# Patient Record
Sex: Female | Born: 1955 | Race: White | Hispanic: No | Marital: Married | Smoking: Never smoker
Health system: Southern US, Community
[De-identification: ages and names within clinical notes are randomized; demographics above are authoritative.]

## PROBLEM LIST (undated history)

## (undated) DIAGNOSIS — I341 Nonrheumatic mitral (valve) prolapse: Secondary | ICD-10-CM

## (undated) DIAGNOSIS — I82409 Acute embolism and thrombosis of unspecified deep veins of unspecified lower extremity: Secondary | ICD-10-CM

## (undated) DIAGNOSIS — E049 Nontoxic goiter, unspecified: Secondary | ICD-10-CM

## (undated) DIAGNOSIS — I10 Essential (primary) hypertension: Secondary | ICD-10-CM

## (undated) DIAGNOSIS — D693 Immune thrombocytopenic purpura: Secondary | ICD-10-CM

## (undated) DIAGNOSIS — R011 Cardiac murmur, unspecified: Secondary | ICD-10-CM

## (undated) DIAGNOSIS — C44722 Squamous cell carcinoma of skin of right lower limb, including hip: Secondary | ICD-10-CM

## (undated) DIAGNOSIS — J42 Unspecified chronic bronchitis: Secondary | ICD-10-CM

## (undated) DIAGNOSIS — I480 Paroxysmal atrial fibrillation: Secondary | ICD-10-CM

## (undated) DIAGNOSIS — M341 CR(E)ST syndrome: Secondary | ICD-10-CM

## (undated) DIAGNOSIS — D6861 Antiphospholipid syndrome: Secondary | ICD-10-CM

## (undated) DIAGNOSIS — M1990 Unspecified osteoarthritis, unspecified site: Secondary | ICD-10-CM

## (undated) DIAGNOSIS — I7300 Raynaud's syndrome without gangrene: Secondary | ICD-10-CM

## (undated) DIAGNOSIS — I312 Hemopericardium, not elsewhere classified: Secondary | ICD-10-CM

## (undated) DIAGNOSIS — K219 Gastro-esophageal reflux disease without esophagitis: Secondary | ICD-10-CM

## (undated) DIAGNOSIS — J189 Pneumonia, unspecified organism: Secondary | ICD-10-CM

## (undated) HISTORY — DX: Immune thrombocytopenic purpura: D69.3

## (undated) HISTORY — DX: Nonrheumatic mitral (valve) prolapse: I34.1

## (undated) HISTORY — DX: Antiphospholipid syndrome: D68.61

## (undated) HISTORY — PX: TUBAL LIGATION: SHX77

## (undated) HISTORY — DX: Cr(e)st syndrome: M34.1

## (undated) HISTORY — PX: ENDOMETRIAL ABLATION: SHX621

## (undated) HISTORY — DX: Acute embolism and thrombosis of unspecified deep veins of unspecified lower extremity: I82.409

---

## 1993-08-07 HISTORY — PX: SPLENECTOMY: SUR1306

## 2005-11-06 ENCOUNTER — Encounter: Admission: RE | Admit: 2005-11-06 | Discharge: 2005-11-06 | Payer: Self-pay | Admitting: *Deleted

## 2006-09-25 ENCOUNTER — Ambulatory Visit: Payer: Self-pay | Admitting: Vascular Surgery

## 2006-09-25 ENCOUNTER — Emergency Department (HOSPITAL_COMMUNITY): Admission: EM | Admit: 2006-09-25 | Discharge: 2006-09-25 | Payer: Self-pay | Admitting: Emergency Medicine

## 2007-09-19 ENCOUNTER — Ambulatory Visit: Admission: RE | Admit: 2007-09-19 | Discharge: 2007-09-19 | Payer: Self-pay | Admitting: Internal Medicine

## 2007-09-19 LAB — PULMONARY FUNCTION TEST

## 2008-11-19 ENCOUNTER — Observation Stay (HOSPITAL_COMMUNITY): Admission: EM | Admit: 2008-11-19 | Discharge: 2008-11-20 | Payer: Self-pay | Admitting: Emergency Medicine

## 2008-11-19 ENCOUNTER — Encounter (INDEPENDENT_AMBULATORY_CARE_PROVIDER_SITE_OTHER): Payer: Self-pay | Admitting: Internal Medicine

## 2008-11-24 ENCOUNTER — Encounter: Admission: RE | Admit: 2008-11-24 | Discharge: 2008-11-24 | Payer: Self-pay | Admitting: Gastroenterology

## 2008-12-25 ENCOUNTER — Encounter: Payer: Self-pay | Admitting: Cardiology

## 2009-04-09 ENCOUNTER — Encounter: Admission: RE | Admit: 2009-04-09 | Discharge: 2009-04-09 | Payer: Self-pay | Admitting: Otolaryngology

## 2009-11-29 ENCOUNTER — Ambulatory Visit (HOSPITAL_COMMUNITY): Admission: RE | Admit: 2009-11-29 | Discharge: 2009-11-29 | Payer: Self-pay | Admitting: Internal Medicine

## 2010-02-22 ENCOUNTER — Emergency Department (HOSPITAL_COMMUNITY): Admission: EM | Admit: 2010-02-22 | Discharge: 2010-02-22 | Payer: Self-pay | Admitting: Family Medicine

## 2010-03-01 ENCOUNTER — Ambulatory Visit (HOSPITAL_COMMUNITY): Admission: RE | Admit: 2010-03-01 | Discharge: 2010-03-01 | Payer: Self-pay | Admitting: Internal Medicine

## 2010-08-28 ENCOUNTER — Encounter: Payer: Self-pay | Admitting: Obstetrics and Gynecology

## 2010-08-28 ENCOUNTER — Encounter: Payer: Self-pay | Admitting: Internal Medicine

## 2010-08-30 ENCOUNTER — Ambulatory Visit (HOSPITAL_COMMUNITY)
Admission: RE | Admit: 2010-08-30 | Discharge: 2010-08-30 | Payer: Self-pay | Source: Home / Self Care | Attending: Gastroenterology | Admitting: Gastroenterology

## 2010-08-30 ENCOUNTER — Observation Stay (HOSPITAL_COMMUNITY)
Admission: EM | Admit: 2010-08-30 | Discharge: 2010-08-31 | Payer: Self-pay | Source: Home / Self Care | Attending: Cardiovascular Disease | Admitting: Cardiovascular Disease

## 2010-08-31 LAB — DIFFERENTIAL
Basophils Absolute: 0 10*3/uL (ref 0.0–0.1)
Basophils Relative: 0 % (ref 0–1)
Eosinophils Absolute: 0.1 10*3/uL (ref 0.0–0.7)
Eosinophils Relative: 1 % (ref 0–5)
Lymphocytes Relative: 13 % (ref 12–46)
Monocytes Absolute: 2 10*3/uL — ABNORMAL HIGH (ref 0.1–1.0)
Monocytes Relative: 14 % — ABNORMAL HIGH (ref 3–12)
Neutro Abs: 9.7 10*3/uL — ABNORMAL HIGH (ref 1.7–7.7)
Neutrophils Relative %: 71 % (ref 43–77)

## 2010-08-31 LAB — CARDIAC PANEL(CRET KIN+CKTOT+MB+TROPI)
CK, MB: 0.6 ng/mL (ref 0.3–4.0)
CK, MB: 0.7 ng/mL (ref 0.3–4.0)
Total CK: 49 U/L (ref 7–177)
Troponin I: 0.05 ng/mL (ref 0.00–0.06)

## 2010-08-31 LAB — CBC
HCT: 36.8 % (ref 36.0–46.0)
Hemoglobin: 12.4 g/dL (ref 12.0–15.0)
MCHC: 33.7 g/dL (ref 30.0–36.0)
RBC: 3.95 MIL/uL (ref 3.87–5.11)

## 2010-08-31 LAB — PROTIME-INR
INR: 1.54 — ABNORMAL HIGH (ref 0.00–1.49)
Prothrombin Time: 19.9 seconds — ABNORMAL HIGH (ref 11.6–15.2)

## 2010-08-31 LAB — COMPREHENSIVE METABOLIC PANEL
Albumin: 3.1 g/dL — ABNORMAL LOW (ref 3.5–5.2)
BUN: 10 mg/dL (ref 6–23)
Calcium: 9.3 mg/dL (ref 8.4–10.5)
Chloride: 103 mEq/L (ref 96–112)
Glucose, Bld: 86 mg/dL (ref 70–99)
Total Protein: 6.4 g/dL (ref 6.0–8.3)

## 2010-08-31 LAB — LIPASE, BLOOD: Lipase: 21 U/L (ref 11–59)

## 2010-08-31 LAB — POCT CARDIAC MARKERS: CKMB, poc: <1 ng/mL — ABNORMAL LOW (ref 1.0–8.0)

## 2010-09-07 NOTE — Discharge Summary (Signed)
  [name], [name]               ACCOUNT NO.:  [id number]  MEDICAL RECORD NO.:  [id number]          PATIENT TYPE:  OBV  LOCATION:  1410                         FACILITY:  [facility]  PHYSICIAN:  Nicki Guadalajara, M.D.     DATE OF BIRTH:  12/26/1955  DATE OF ADMISSION:  08/30/2010 DATE OF DISCHARGE:  08/31/2010                              DISCHARGE SUMMARY   ADDENDUM:  DISCHARGE MEDICATIONS:  Please see medical reconciliation.  DISPOSITION:  [name] will follow up with Dr. Lynnea Ferrier on February 1st at 10 a.m.  She has had a recent nuclear stress test and echocardiogram.  Dr. Lynnea Ferrier will review the echocardiogram in the office during her visit as well as her nuclear stress test and then plan accordingly her treatment.  We will also discontinue her Procardia due to the fact that we were starting her on Cardizem 180 mg p.o. daily.    ______________________________ Wilburt Finlay, PA   ______________________________ Nicki Guadalajara, M.D.    BH/MEDQ  D:  08/31/2010  T:  09/01/2010  Job:  [id number]  cc:   Ritta Slot, MD Fax: [phone]  Electronically Signed by Wilburt Finlay PA on 09/05/2010 02:45:37 PM Electronically Signed by Nicki Guadalajara M.D. on 09/07/2010 02:39:14 PM

## 2010-09-07 NOTE — H&P (Signed)
NAMELONISHA, BOBBY               ACCOUNT NO.:  [id number]  MEDICAL RECORD NO.:  [id number]           PATIENT TYPE:  LOCATION:                                 FACILITY:  PHYSICIAN:  Nicki Guadalajara, M.D.     DATE OF BIRTH:  11-19-1955  DATE OF ADMISSION: DATE OF DISCHARGE:                             HISTORY & PHYSICAL   CHIEF COMPLAINT:  Tachycardia.  HISTORY OF PRESENT ILLNESS:  [name] is a pleasant 55 year old female who has been seen by Dr. Lynnea Ferrier in the past.  She has multiple medical problems.  We saw her in April 2010 for some chest pain, which we felt was noncardiac.  She has had PACs and PVCs in the past, but no arrhythmias.  She does have a history of CREST and Raynaud and scleroderma.  She has had chronic lower extremity DVTs and is on chronic Coumadin.  She was admitted to [facility] for an endoscopy to rule out food impaction or esophageal spasm.  She had been admitted electively and had held her Coumadin on the day of admission.  She had her procedure done and then had some SVT and then went into atrial fibrillation.  This was about 3 p.m. and lasted until about 6 p.m.  She finally converted on IV diltiazem.  She did have some chest discomfort. Dr. Tresa Endo feels it is safer to admit her overnight for 24-hour observation and continue to cycle her enzymes.  She has also had a CT scan of her chest to rule out pulmonary embolism as her INR was subtherapeutic at 1.54.  This is pending.  PAST MEDICAL HISTORY:  Remarkable for, 1. Scleroderma. 2. Raynaud's. 3. Lupus. 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 6. Trigeminal neuralgia. 7. Chronic interstitial lung disease by prior x-rays. 8. Goiter with normal thyroid studies. 9. 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  PREVIOUS SURGERIES:  Splenectomy in 1995, uterine oblation in 2008, tubal ligation in 1985, and previous esophageal  dilatations.  HOME MEDICATIONS: 1. Nexium 40 mg twice a day. 2. Procardia XL 30 mg a day. 3. Singulair 10 mg a day. 4. Prometrium 100 mg a day. 5. Aspirin 325 mg a day. 6. Losartan 100 mg a day. 7. Coumadin 7.5 mg a day or as directed. 8. Neurontin 200 mg t.i.d. 9. Cymbalta 20 mg a day. 10.Actonel once a week. 11.Cipro 500 mg a day. 12.Valtrex 500 mg b.i.d. 13.Nitroglycerin sublingual p.r.n.  ALLERGIES:  She is intolerant to DEMEROL, but has no other drug allergies.  SOCIAL HISTORY:  She is married, she is a nonsmoker.  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  FAMILY HISTORY:  Unremarkable for coronary disease, her father died in his 37s of unknown causes, mother died in her 35s of lung cancer.  REVIEW OF SYSTEMS:  Essentially unremarkable except for noted above, she did say she had some vague chest discomfort last week, which actually precipitated this evaluation.  It is not clearly anginal.  PHYSICAL EXAMINATION:  VITAL SIGNS:  Blood pressure 101/56, heart rate currently  76, O2 saturations 96%. GENERAL:  She is a well-developed and well-nourished female, in no acute distress. HEENT:  Normocephalic.  Extraocular movements are intact.  Sclerae nonicteric. NECK:  Goiter, it is firm and nontender.  No bruit. CHEST:  Clear to auscultation and percussion. CARDIAC:  Regular rate and rhythm with 1/6 systolic murmur at the left sternal border. ABDOMEN:  Nondistended, nontender. EXTREMITIES:  No edema.  Distal pulses are diminished bilaterally. NEUROLOGIC:  Grossly intact.  She is awake, alert, oriented, and cooperative.  Moves all extremities without obvious deficit. SKIN:  Cool and dry.  LABORATORY DATA:  White count 13.6, hemoglobin 12.4, hematocrit 36.8, platelets 286.  INR 1.54.  Sodium 136, potassium 3.5, BUN 10, creatinine 0.71. Troponin is negative x1.  EKG shows sinus rhythm now with nonspecific ST changes.  IMPRESSION: 1. Chest pain, rule out  cardiac. 2. Paroxysmal atrial fibrillation, now in sinus rhythm, on diltiazem. 3. Raynaud, on Procardia at home, which the patient says had helped     her symptoms of Raynaud's. 4. Scleroderma and CREST syndrome. 5. Chronic reflux and achalasia. 6. History of goiter. 7. Chronic interstitial lung disease, by x-ray. 8. History of idiopathic thrombocytopenic purpura with prior     splenectomy. 9. History of trigeminal neuralgia, followed by Dr. Sandria Manly in the past. 10.Chronic lower extremity deep vein thrombosis, on chronic Coumadin     with subtherapeutic INR, held for endoscopy today, chest CT is     pending to rule out pulmonary embolism.  PLAN:  The patient was seen Dr. Tresa Endo and myself.  I will go ahead and discontinue her Procardia and put her on diltiazem p.o.  We will also add low-dose Imdur.  Dr. Tresa Endo would like to admit her for overnight observation and continue cycle her enzymes.  She will need an outpatient Myoview at some point and this can be raised.     Abelino Derrick, P.A.   ______________________________ Nicki Guadalajara, M.D.    Lenard Lance  D:  08/30/2010  T:  08/30/2010  Job:  [id number]  cc:   Ritta Slot, MD Fax: [phone]  Massie Maroon, MD Fax: [phone]  Electronically Signed by Corine Shelter P.A. on 09/06/2010 05:10:24 PM Electronically Signed by Nicki Guadalajara M.D. on 09/07/2010 02:39:18 PM

## 2010-09-07 NOTE — Discharge Summary (Signed)
[name], [name]               ACCOUNT NO.:  [id number]  MEDICAL RECORD NO.:  [id number]          PATIENT TYPE:  OBV  LOCATION:  1410                         FACILITY:  [facility]  PHYSICIAN:  Nicki Guadalajara, M.D.     DATE OF BIRTH:  June 21, 1956  DATE OF ADMISSION:  08/30/2010 DATE OF DISCHARGE:  08/31/2010                              DISCHARGE SUMMARY   DISCHARGE DIAGNOSES: 1. Paroxysmal atrial fibrillation, converted to normal sinus rhythm on     IV diltiazem. 2. Chest pain, cardiac enzymes are negative, likely esophageal spasm. 3. History of Raynaud's. 4. History of scleroderma/CREST syndrome. 5. Chronic reflux and achalasia. 6. Multinodular goiter. 7. Chronic interstitial lung disease. 8. History of idiopathic thrombocytopenic purpura with prior     splenectomy. 9. History of trigeminal neuralgia. 10.Chronic lower extremity deep venous thrombosis, on Coumadin.  This     was held for endoscopy, which was on January 24th, and subsequently     restarted.  CT scan was negative for pulmonary embolism. 11.Small pericardial effusion.  HOSPITAL COURSE:  [name] is a 55 year old Caucasian female who is a patient of Dr. Lynnea Ferrier.  She has a history of scleroderma, Raynaud's, lupus, CREST syndrome, ITP status post splenectomy in 1995, trigeminal neuralgia, chronic interstitial lung disease on prior x-rays, goiter with normal thyroid studies, DVT in lower extremities requiring lifelong Coumadin.  She presented for endoscopy at [facility] after being ruled for food impaction or esophageal spasm.  When the procedure was done, she had some SVT and went into atrial fibrillation at approximately 3 p.m. and it lasted until 6 p.m.  She was given IV diltiazem as well as a dose of 180 mg p.o.  She was admitted for observation, to cycle her cardiac enzymes.  She also had a CT scan to rule out pulmonary embolism due to the fact that her INR was subtherapeutic.  Cardiac enzymes had been  negative.  CT scan was negative for pulmonary embolism, however, was positive for a small pericardial effusion.  The patient is currently in normal sinus rhythm, had been throughout the night.  Rate is 72 beats per minute.  The patient was also started on Imdur 15 mg daily by mouth. Plan is to continue her on that and will continue Cardizem 100 mg p.o. daily.  She has a followup appointment scheduled with Dr. Lynnea Ferrier on February 1st at 10 a.m. and we will schedule a Persantine Myoview stress test prior to that appointment and also schedule for possible 2-D echocardiogram to follow up on the pericardial effusion.  DISCHARGE LABORATORY DATA:  INR 1.67.  CK-MB 0.6.  Troponin 0.05.  WBCs 13.6, hemoglobin 12.4, hematocrit 36.8, platelets 286.  Sodium 136, potassium 3.5, chloride 103, carbon dioxide 25, glucose 86, BUN 10, creatinine 0.71.  Total bilirubin 1.1, alk phos 73, AST 16, ALT 20, total protein 6.4, albumin 3.1, calcium 9.3.  Lipase is 21.  Free T4 is 0.81 and thyroid-stimulating hormone is 0.674.  STUDIES/PROCEDURES: 1. Chest x-ray:  Impression:  Probable mild interstitial change.  No     definite active process.  Question enlargement of thyroid gland. 2. CT angiogram  of the chest, August 30, 2010:  Impression:  No     pulmonary embolism.  Increased bilateral lower lobe linear     atelectasis or scarring.  Interval small pericardial effusion.     Stable multinodular goiter with large substernal extension on the     left.  Mild dilation of the esophagus with mild progression and     continuing to contain an air-fluid level.  This could be due to     gastroesophageal reflux disease or esophageal dysmotility.  DISPOSITION:  [name] was discharged home in stable condition.  DIET:  She is to eat a heart-healthy diet.  FOLLOWUP:  She will follow up with [facility] for a Persantine Myoview stress test and then see Dr. Lynnea Ferrier at 10 o'clock on February 1st for  a followup appointment.    ______________________________ Wilburt Finlay, PA   ______________________________ Nicki Guadalajara, M.D.    BH/MEDQ  D:  08/31/2010  T:  09/01/2010  Job:  [id number]  cc:   Ritta Slot, MD Fax: [phone]  Electronically Signed by Wilburt Finlay PA on 09/05/2010 02:45:11 PM Electronically Signed by Nicki Guadalajara M.D. on 09/07/2010 02:39:08 PM

## 2010-11-16 LAB — BASIC METABOLIC PANEL
BUN: 14 mg/dL (ref 6–23)
Creatinine, Ser: 0.81 mg/dL (ref 0.4–1.2)
GFR calc Af Amer: >60 mL/min (ref >60)
GFR calc non Af Amer: >60 mL/min (ref >60)
Potassium: 3.9 mEq/L (ref 3.5–5.1)

## 2010-11-16 LAB — DIFFERENTIAL
Basophils Absolute: 0.1 10*3/uL (ref 0.0–0.1)
Basophils Relative: 0 % (ref 0–1)
Eosinophils Absolute: 0 10*3/uL (ref 0.0–0.7)
Lymphocytes Relative: 8 % — ABNORMAL LOW (ref 12–46)
Lymphs Abs: 1.4 10*3/uL (ref 0.7–4.0)
Monocytes Relative: 11 % (ref 3–12)
Monocytes Relative: 8 % (ref 3–12)
Neutro Abs: 11.2 10*3/uL — ABNORMAL HIGH (ref 1.7–7.7)
Neutrophils Relative %: 76 % (ref 43–77)
Neutrophils Relative %: 83 % — ABNORMAL HIGH (ref 43–77)

## 2010-11-16 LAB — POCT I-STAT, CHEM 8
Calcium, Ion: 1.25 mmol/L (ref 1.12–1.32)
Creatinine, Ser: 0.7 mg/dL (ref 0.4–1.2)
Glucose, Bld: 104 mg/dL — ABNORMAL HIGH (ref 70–99)
Hemoglobin: 15.6 g/dL — ABNORMAL HIGH (ref 12.0–15.0)
TCO2: 25 mmol/L (ref 0–100)

## 2010-11-16 LAB — POCT CARDIAC MARKERS
CKMB, poc: <1 ng/mL — ABNORMAL LOW (ref 1.0–8.0)
CKMB, poc: <1 ng/mL — ABNORMAL LOW (ref 1.0–8.0)
Myoglobin, poc: 35.3 ng/mL (ref 12–200)

## 2010-11-16 LAB — COMPREHENSIVE METABOLIC PANEL
ALT: 22 U/L (ref 0–35)
AST: 24 U/L (ref 0–37)
Albumin: 3.9 g/dL (ref 3.5–5.2)
Alkaline Phosphatase: 87 U/L (ref 39–117)
Potassium: 3.9 mEq/L (ref 3.5–5.1)
Sodium: 140 mEq/L (ref 135–145)
Total Protein: 7.2 g/dL (ref 6.0–8.3)

## 2010-11-16 LAB — CBC
HCT: 42.3 % (ref 36.0–46.0)
MCHC: 34 g/dL (ref 30.0–36.0)
MCV: 93.4 fL (ref 78.0–100.0)
Platelets: 216 10*3/uL (ref 150–400)
Platelets: 252 10*3/uL (ref 150–400)
RBC: 4.52 MIL/uL (ref 3.87–5.11)
RDW: 13.5 % (ref 11.5–15.5)
WBC: 16.7 10*3/uL — ABNORMAL HIGH (ref 4.0–10.5)

## 2010-11-16 LAB — PROTIME-INR
INR: 1.8 — ABNORMAL HIGH (ref 0.00–1.49)
INR: 2.1 — ABNORMAL HIGH (ref 0.00–1.49)
Prothrombin Time: 25.2 seconds — ABNORMAL HIGH (ref 11.6–15.2)

## 2010-11-16 LAB — LIPID PANEL
LDL Cholesterol: 75 mg/dL (ref 0–99)
Total CHOL/HDL Ratio: 2.7 RATIO
VLDL: 7 mg/dL (ref 0–40)

## 2010-11-16 LAB — CARDIAC PANEL(CRET KIN+CKTOT+MB+TROPI)
CK, MB: 0.5 ng/mL (ref 0.3–4.0)
Relative Index: INVALID (ref 0.0–2.5)
Relative Index: INVALID (ref 0.0–2.5)
Total CK: 39 U/L (ref 7–177)
Troponin I: <0.01 ng/mL (ref 0.00–0.06)
Troponin I: <0.01 ng/mL (ref 0.00–0.06)
Troponin I: <0.01 ng/mL (ref 0.00–0.06)

## 2010-11-16 LAB — APTT: aPTT: 50 seconds — ABNORMAL HIGH (ref 24–37)

## 2010-11-29 ENCOUNTER — Other Ambulatory Visit (HOSPITAL_COMMUNITY): Payer: Self-pay | Admitting: Internal Medicine

## 2010-11-29 DIAGNOSIS — R911 Solitary pulmonary nodule: Secondary | ICD-10-CM

## 2010-12-03 ENCOUNTER — Inpatient Hospital Stay (INDEPENDENT_AMBULATORY_CARE_PROVIDER_SITE_OTHER)
Admission: RE | Admit: 2010-12-03 | Discharge: 2010-12-03 | Disposition: A | Discharge status: Home / Self Care | Payer: 59 | Source: Ambulatory Visit | Attending: Emergency Medicine | Admitting: Emergency Medicine

## 2010-12-03 DIAGNOSIS — J069 Acute upper respiratory infection, unspecified: Secondary | ICD-10-CM

## 2010-12-03 LAB — POCT RAPID STREP A (OFFICE): Streptococcus, Group A Screen (Direct): NEGATIVE

## 2010-12-19 ENCOUNTER — Other Ambulatory Visit (HOSPITAL_COMMUNITY): Payer: Self-pay

## 2010-12-20 NOTE — Discharge Summary (Signed)
NAMESTELLAH, DONOVAN               ACCOUNT NO.:  [id number]   MEDICAL RECORD NO.:  [id number]          PATIENT TYPE:  INP   LOCATION:  2915                         FACILITY:  [facility]   PHYSICIAN:  Monte Fantasia, MD  DATE OF BIRTH:  July 20, 1956   DATE OF ADMISSION:  11/19/2008  DATE OF DISCHARGE:  11/20/2008                               DISCHARGE SUMMARY   PRIMARY CARE PHYSICIAN:  Massie Maroon, MD, [facility].   PRIMARY CARDIOLOGIST:  Dr.  Ladona Ridgel in [place].   GASTROENTEROLOGIST:  Jordan Hawks. Elnoria Howard, MD   RHEUMATOLOGIST:  Lemmie Evens, MD   NEUROLOGIST:  Genene Churn. Love, MD   DISCHARGE DIAGNOSES:  1. Chest pain more likely to be noncardiac.  2. Scleroderma.  3. History of Raynaud disease.  4. History of venous thromboembolism.  5. Goiter.   MEDICATIONS UPON DISCHARGE:  1. Progesterone cream 40 mg/mL 1-2 times to be applied topically every      day to the inner thighs.  2. Nexium 40 mg p.o. b.i.d.  3. Sucralfate 1 g p.o. q.i.d. for 4 weeks.  4. Gabapentin 100 mg p.o. t.i.d.  5. Coumadin 7.5 mg p.o. daily except on Sundays when she takes 5 mg      daily.  6. Procardia XL 60 mg half a tablet nightly.  7. Vitamin D 1000 units p.o. b.i.d.  8. Calcium 1000 mg plus vitamin D 1 tablet p.o. b.i.d.  9. Aspirin 325 mg p.o. daily.  10.Ramipril 5 mg p.o. daily.   RADIOLOGICAL INVESTIGATIONS DONE DURING THE STAY IN THE HOSPITAL:  1. Chest x-ray done on November 19, 2008.  Impression, peribronchial      thickening with possibly related to chronic bronchitis of smoking.  2. Cardiomegaly without congestive heart failure.  CT angio done on      November 19, 2008.  Impression;  3. No evidence of pulmonary embolism.  4. Diffuse dilated esophagus containing air and fluid.  This does      suggest underlying esophageal pathology such as stricture or      achalasia.  5. Large thyroid goiter extending into the upper superior mediastinum      and displacing the trachea to the  right.  6. Pulmonary venous hypertension and interstitial prominence      suggesting of potentially underlying colonic lung disease, no overt      pulmonary edema.   PROCEDURE DONE DURING THE STAY IN THE HOSPITAL:  EGD done on November 20, 2008, findings hiatal hernia, non-contracting esophagus.  Recommendations to continue PPI b.i.d. and sucralfate 1 g p.o. q.i.d.  and to follow up with Dr. Elnoria Howard gastroenterologist within 2 weeks.   COURSE DURING THE HOSPITAL STAY:  Ms. Tison is a 55 year old pleasant  Caucasian lady.  The patient was admitted on November 19, 2008 with  complaints of chest pain with substernal in origin and radiating to the  back, severe 4/10 in intensity.  The patient was admitted to the  telemetry unit to the step-down unit for telemetry monitoring.  The  patient was initially started on nitroglycerin drip and cardiac enzymes  were  cycled.  Cardiology evaluation was also done with [facility] and vascular and recommended the same, also I recommended for GI  evaluation with the CT findings.  On admission in the ER, the patient  had undergone a CT scan, which showed diffusely dilated esophagus  containing air and fluid.  GI evaluation where Dr. Jeani Hawking was  asked for and the patient underwent an EGD on November 20, 2008 which  showed non-contracted esophagus and hiatal hernia.  As per Dr. Haywood Pao  recommendation, the patient is recommended to follow up with him in 2  weeks and to continue PPI b.i.d. and sucralfate 1 g q.i.d.  The  nitroglycerin drip was discontinued in 24 hours and the patient at  present has no longer chest pain.  Cardiac enzymes x3 sets have been  negative through the stay in the hospital.  As per discussions with  Cardiology, the patient has been started on ramipril for renal  protection and the patient needs an outpatient Myoview stress test as an  outpatient.  The patient at present is medically stable to be discharged  and can be discharge  home.   LABS DONE PRIOR TO DISCHARGE:  Total WBC 14.8, hemoglobin 12.1,  hematocrit 35.7, platelets of 216, neutrophils 11.2.  Prothrombin time  21.4, INR 1.8, PTT 50.  Sodium 141, potassium 3.8, chloride 106, bicarb  26.  BUN 16, glucose 104, creatinine 0.7, total bilirubin 0.6, alkaline  phosphatase 87, AST 24, ALT 22, total protein 7.2, albumin 3.9, calcium  of 10.3.  Cardiac enzymes x3 sets have been negative.  Total cholesterol  130, triglycerides 36, HDL 48, LDL 75, TSH 0.331.   PHYSICAL EXAMINATION:  VITAL SIGNS:  Today, temperature of 98.2, heart  rate of 85, blood pressure 114/57, oxygen saturations 98% on room air.  HEENT:  Pupils are equal and reacting to light.  NECK:  Supple.  No pallor.  No lymphadenopathy.  RESPIRATORY:  Air entry is bilaterally equal.  No rales.  No rhonchi.  CARDIOVASCULAR:  S1, S2 regular rate and rhythm.  ABDOMEN:  Soft.  No guarding.  No rigidity.  No tenderness.  EXTREMITIES:  No edema of feet.  SKIN:  No rashes intact.  No breakdown in the skin.  MUSCULOSKELETAL:  Unremarkable.   DISPOSITION:  The patient at present is medically stable to be  discharged, recommended to have stress test as an outpatient and also  recommended to follow up with Dr. Jeani Hawking in 2 weeks.  The patient  is recommended to follow up with her primary care physician in next 1-2  weeks.  Also please note that the patient had a large goiter reported on  her CT findings.  Displacing the trachea to the right, we will need to  follow up with her primary care  physician regarding the same.  The patient at present again is medically  stable to be discharged and then be discharged home.   TOTAL TIME FOR DICTATION OF THE DISCHARGE:  Forty minutes.      Monte Fantasia, MD  Electronically Signed     MP/MEDQ  D:  11/20/2008  T:  11/21/2008  Job:  [id number]   cc:   Jordan Hawks. Elnoria Howard, MD  Massie Maroon, MD  Dr. Ladona Ridgel  [facility]  Antonieta Iba, MD  Ritta Slot, MD

## 2010-12-20 NOTE — H&P (Signed)
NAMEMERILEE, WIBLE               ACCOUNT NO.:  [id number]   MEDICAL RECORD NO.:  [id number]          PATIENT TYPE:  EMS   LOCATION:  MAJO                         FACILITY:  [facility]   PHYSICIAN:  Marcellus Scott, MD     DATE OF BIRTH:  January 10, 1956   DATE OF ADMISSION:  11/19/2008  DATE OF DISCHARGE:                              HISTORY & PHYSICAL   PRIMARY MEDICAL DOCTOR:  Dr. Pearson Grippe.   PRIMARY CARDIOLOGIST:  Dr. Ladona Ridgel in [place].  Retired.   RHEUMATOLOGIST:  Dr.  Jimmy Footman.   NEUROLOGIST:  Dr. Sandria Manly.   CHIEF COMPLAINT:  Chest pain.   HISTORY OF PRESENTING ILLNESS:  Ms. 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  She has history of lupus,  scleroderma, CREST, Raynaud's, recurrent venous thromboembolism, on  chronic anticoagulation who was in her usual state of health until  approximately 1:30 this morning.  The chest pain woke her up from her  sleep.  Patient indicates she has been having continuous mid to lower  substernal, dull to pressing type of 4/10 chest pain.  This chest pain  radiates to the back.  She has associated pressure sensation on both  sides of her neck.  The pain worsens with deep inspiration.  It is also  worse when she lies flat.  She denies any water brash.  There is no  associated dyspnea or fever.  Patient has chronic cough which has not  worsened.  There is no worsening of pain on movement of the chest wall.  There is no history of direct trauma or lifting or any unusual physical  activity.  She has not had any recent flu-like illness.  Due to these  symptoms, patient presented to the emergency room.  Patient indicates  that when she got the first dose of fentanyl her chest pain improved,  however, subsequent doses of fentanyl have not helped.  The chest pain  did come back.  She indicates that the GI cocktail or the sublingual  nitroglycerin have  not made any difference.  Her chest pain currently is  5/10 and increases to 7/10 with deep inspiration.  She also suggests  that this pain is similar to the esophageal spasm pains that she had  decades ago.  However, at that time, the nitroglycerin had helped but  this time she indicates it has not.   PAST MEDICAL HISTORY:  1. Irregular heartbeats, PACs, and PVCs.  2. ? Mild mitral valve prolapse.  3. History of pneumonia a month ago.  4. Bronchitis once per year.  5. Idiopathic thrombocytopenic purpura status post splenectomy.  6. Lupus/scleroderma/CREST/Raynaud's.  7. Gastroesophageal reflux disease.  8. DVT x2.  Last one in 1995 when she tried to come off the Coumadin.      She is on chronic anticoagulation and aspirin.  9. Trigeminal neurology.  10.Osteopenia.  11.Colonoscopy 4 years ago, said to be negative.  12.No recent EGD.  13.Mammography done yearly have been negative.   PAST SURGICAL HISTORY.:  1. Status  post splenectomy in 1995.  2. Uterine ablation in 2008.  3. Tubal ligation in 1985.  4. Esophageal dilatation approximately 20 years ago.   ALLERGIES:  DEMEROL WHICH IS MAINLY AN INTOLERANCE WHICH CAUSES SEVERE  HEADACHE AND SWEATING.   MEDICATIONS:  1. Progesterone cream 40 mg per mL 1 to 2 times applied topically      daily to her inner thighs.  2. Nexium 40 mg p.o. b.i.d.  3. Gabapentin 100 mg p.o. t.i.d.  4. Coumadin 7.5 mg p.o. daily except on Sundays when she takes 5 mg.  5. Procardia XL 60 mg tablet 1/2 tablet p.o. at bedtime.  6. Vitamin D 1000 units p.o. b.i.d.  7. Cipro 500 mg p.o. b.i.d. p.r.n. for GI bacterial overgrowth.  8. Allegra 180 mg p.o. q.h.s.  9. Calcium 1000 mg plus vitamin D one p.o. b.i.d.  10.Actonel monthly.  11.Aspirin 325 mg p.o. daily.   FAMILY HISTORY:  Patient's father died in his 82s of unknown etiology.  Patient's mother died in her 53s from lung cancer.  Patient's sister is  alive and has rheumatoid arthritis.   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  She is physically active.  She denies smoking.  However, she  indicates that until age 72 years she lived with her parents who were  heavy smokers and hence was exposed to passive smoking.  She drinks 1 to  3 drinks of wine 3 to 5 times per week.   REVIEW OF SYSTEMS:  Comprehensive 14-system review done and apart from  history of presenting illness is positive for intermittent blue or cold  fingers, reflux symptoms including bitter taste and foul smell in the  mouth.  Also, this morning she hit her left 4th toe onto something with  minimal scratch but no bleeding.  Patient has history of mosquito bites  during Easter when she was visiting in [place], [place], but  there is no history of tick bites or rashes.   PHYSICAL EXAMINATION:  Ms. Davisson is a moderately-built and nourished  female patient in minimal painful but no cardiopulmonary distress.  VITAL SIGNS:  Temperature 99.6 degrees Fahrenheit, blood pressure 109/65  mmHg, pulse 80 per minute, regular, respiration 16 per minute, and  saturating at 99% on room air.  HEAD/EYE:  Head is nontraumatic, normocephalic.  Pupils equally reacting  to light and accommodation.  ENT:  No oropharyngeal erythema or thrush.  NECK:  Supple.  No JVDs or carotid bruit.  LYMPHATICS:  No lymphadenopathy.  RESPIRATORY SYSTEM:  Limited inspiration secondary to pain but clear to  auscultation.  CARDIOVASCULAR SYSTEM:  First and 2nd heart sounds heard.  No 3rd or 4th  heart sounds or murmurs.  ABDOMEN:  Obese mildly.  There is minimal tenderness in the epigastric  region with reproducible pain but no rigidity, guarding, or rebound.  No  organomegaly or mass appreciated.  Bowel sounds are normally heard.  CENTRAL NERVOUS SYSTEM:  Patient is awake, alert, oriented x3 with no  focal neurological deficits.  EXTREMITIES:  With trace lower extremity edema.  Patient has a  Band-Aid  on the left 4th toe which is clean and dry.  SKIN:  Without any rashes.  MUSCULOSKELETAL SYSTEM:  Unremarkable.  There is difficulty pinching the  skin of her dorsum of her hand.  There is no current cyanosis.  She has  slight coolness of her fingers.   LAB DATA:  Point of care cardiac markers x2  were negative.  CBCs  remarkable for white blood cell 16.7.  Hemoglobin 14.3, hematocrit 42.3,  platelets 252.  Comprehensive metabolic panel is unremarkable with BUN  14, creatinine 0.75.  INR today is 2.1.   RADIOLOGY:  1. Her CT angiogram of the chest has been reported as:      a.     No evidence of pulmonary embolism.      b.     Diffusely dilated esophagus containing air and fluid.  This       does suggest underlying esophageal pathology such as stricture or       achalasia.      c.     Large thyroid goiter extending into the left superior       mediastinum and displacing the trachea to the right.      d.     Pulmonary venous hypertension and interstitial prominence       suggestive of potentially underlying chronic lung disease.  No       overt pulmonary edema.  2. Chest x-ray on November 19, 2008.  Impression:      a.     Peribronchial thickening which may relate to chronic       bronchitis or smoking.      b.     Cardiomegaly without congestive failure.  3. EKG reviewed by me which is sinus rhythm at 79 beats per minute,      normal axis and, Q-waves in leads V1 through V3.   ASSESSMENT AND PLAN:  1. Chest pain.  Differential diagnoses are to rule out unstable      angina, esophageal spasm, pleurisy, or pericarditis.  We will admit      patient to step-down.  We will continue to cycle cardiac enzymes      and obtain 2D echocardiogram.  We will check ESR.  We will place      patient on nitroglycerin drip to titrate to the chest pain.      Patient has received a full aspirin today.  We will continue same.      Cardiology has been consulted to consider further evaluation.   2. Dilated esophagus.  Rule out achalasia versus stricture.  If      cardiac workup is negative and pain persists, we will consider GI      input either inpatient versus outpatient.  3. Recurrent venous thromboembolism in patient at high risk given her      connective tissue disorder and lupus anticoagulant positive status.      We will continue full anticoagulation.  Patient is therapeutically      anticoagulated at this time.  4. Leukocytosis, unclear etiology.  No clinical features of sepsis.      We will check urinalysis.  This may be stress related.  5. Lupus, scleroderma, CREST, Raynaud's.  Not on any steroids.  6. Goiter.  We will check a TSH.  There is no upper airway compromise      at this time.   TIME SPENT COORDINATING THIS ADMISSION:  An hour and 10 minutes.      Marcellus Scott, MD  Electronically Signed     AH/MEDQ  D:  11/19/2008  T:  11/19/2008  Job:  [id number]   cc:   Massie Maroon, MD  Lemmie Evens, M.D.  Genene Churn. Love, M.D.

## 2010-12-20 NOTE — Consult Note (Signed)
[name], [name]               ACCOUNT NO.:  [id number]   MEDICAL RECORD NO.:  [id number]          PATIENT TYPE:  INP   LOCATION:  2915                         FACILITY:  [facility]   PHYSICIAN:  Jordan Hawks. Elnoria Howard, MD    DATE OF BIRTH:  07-Dec-1955   DATE OF CONSULTATION:  11/19/2008  DATE OF DISCHARGE:                                 CONSULTATION   REASON FOR CONSULTATION:  Chest pain.   PRIMARY CARE Jaleeyah Munce:  Massie Maroon, MD   PRIMARY CARDIOLOGIST:  Dr. Ladona Ridgel in [facility].   RHEUMATOLOGIST:  Lemmie Evens, MD   NEUROLOGIST:  Genene Churn. Love, MD   HISTORY PRESENT ILLNESS:  This is a 55 year old female with a past  medical history of lupus, scleroderma, CREST syndrome, Raynaud,  recurrent venous thromboembolism who requires chronic anticoagulation  and was admitted to the hospital with complaints of chest pain.  She  woke up acutely at 1:30 a.m. with chest pain that was associated with  shortness of breast.  She presented to the emergency room for further  evaluation, treatment, and CT scan.  CT angio was performed and was  negative for any pulmonary embolus and there is currently no cardiac  etiology that can be discerned; however, on CT scan it was noted that  she had a dilated esophagus with an air-fluid level.  In the past, she  has had multiple dilations for her history of scleroderma.  She was  diagnosed at age of 18 with lupus and then subsequently in her 48s with  mixed connective tissue disease.  She required multiple dilations in her  35s and 30s with an EGD, but she did not require any further treatment  for the past 10-15 years.  The patient denies any issues with dysphagia,  nausea, or vomiting.  Because of the CT scan findings, a GI consultation  was requested for further evaluation and treatment.   Past medical history and past surgical history is as stated above with  history of aspiration pneumonia, gastroesophageal reflux disease,  osteopenia, status post  splenectomy in 1995, uterine ablation in 2008,  tubal ligation in 1985.   SOCIAL HISTORY:  The patient is married.  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  No tobacco or illicit drug use.  She does  drink 1-3 drinks of wine 3-5 times per week.   FAMILY HISTORY:  Noncontributory.   MEDICATIONS:  Progesterone cream, Nexium, gabapentin, Coumadin,  Procardia XL, vitamin D, ciprofloxacin, Allegra, calcium, Actonel, and  aspirin.   REVIEW OF SYSTEMS:  As stated above in history present illness,  otherwise negative.   PHYSICAL EXAMINATION:  VITAL SIGNS:  Stable.  GENERAL:  The patient is in no acute distress.  Alert and oriented.  HEENT:  Normocephalic, atraumatic.  Extraocular muscles intact.  NECK:  Supple.  No lymphadenopathy.  LUNGS:  Clear to auscultation bilaterally.  CARDIOVASCULAR:  Regular rate and rhythm.  ABDOMEN:  Flat, soft, nontender, and nondistended.  Positive bowel  sounds.  EXTREMITIES:  No clubbing, cyanosis, or edema.  There is scleroderma  changes in her  distal extremities.   LABORATORY VALUES:  White blood cell count is 16.7, hemoglobin 15.6, MCV  is 93.7, platelets are 252, INR 2.1.  Sodium 141, potassium 3.8,  chloride 106, CO2 of 26, glucose 104, BUN 16, creatinine 0.7, AST 24,  ALT 22, albumin is 3.9.   IMPRESSION:  1. Distal esophageal stricture.  2. History of scleroderma.  3. History of esophageal strictures.  4. Gastroesophageal reflux disease.   It was apparent from the CT scan that she does have a stricture, she  does have dilation of her esophagus.  A repeat EGD with dilation will be  required at this time.  Unfortunately, she ate dinner and also full  lunch today, this will make it much more difficult in order to clear her  esophagus.  It may be that the esophagus cannot be cleared and dilation  cannot be performed.  There is also the history of her anticoagulation  currently as she has not taken her Coumadin since last morning  and at  this time her INR is at 2.1.   PLAN:  1. Schedule for an EGD with possible dilation.  2. Follow up on the INR in the a.m.  3. Anticoagulation pending the plans and findings of the EGD.      Jordan Hawks Elnoria Howard, MD  Electronically Signed     PDH/MEDQ  D:  11/19/2008  T:  11/20/2008  Job:  [id number]   cc:   Dr. Ladona Ridgel in [place], MD  Lemmie Evens, M.D.  Genene Churn. Love, M.D.

## 2011-05-26 ENCOUNTER — Inpatient Hospital Stay (INDEPENDENT_AMBULATORY_CARE_PROVIDER_SITE_OTHER)
Admission: RE | Admit: 2011-05-26 | Discharge: 2011-05-26 | Disposition: A | Discharge status: Home / Self Care | Payer: 59 | Source: Ambulatory Visit | Attending: Emergency Medicine | Admitting: Emergency Medicine

## 2011-05-26 DIAGNOSIS — J029 Acute pharyngitis, unspecified: Secondary | ICD-10-CM

## 2011-05-26 DIAGNOSIS — J069 Acute upper respiratory infection, unspecified: Secondary | ICD-10-CM

## 2011-09-14 ENCOUNTER — Ambulatory Visit (INDEPENDENT_AMBULATORY_CARE_PROVIDER_SITE_OTHER): Payer: 59 | Admitting: Cardiology

## 2011-09-14 ENCOUNTER — Encounter: Payer: Self-pay | Admitting: Cardiology

## 2011-09-14 DIAGNOSIS — R0989 Other specified symptoms and signs involving the circulatory and respiratory systems: Secondary | ICD-10-CM

## 2011-09-14 DIAGNOSIS — R0609 Other forms of dyspnea: Secondary | ICD-10-CM

## 2011-09-14 DIAGNOSIS — I10 Essential (primary) hypertension: Secondary | ICD-10-CM

## 2011-09-14 DIAGNOSIS — I341 Nonrheumatic mitral (valve) prolapse: Secondary | ICD-10-CM | POA: Insufficient documentation

## 2011-09-14 DIAGNOSIS — I059 Rheumatic mitral valve disease, unspecified: Secondary | ICD-10-CM

## 2011-09-14 DIAGNOSIS — R0600 Dyspnea, unspecified: Secondary | ICD-10-CM | POA: Insufficient documentation

## 2011-09-14 DIAGNOSIS — I319 Disease of pericardium, unspecified: Secondary | ICD-10-CM

## 2011-09-14 DIAGNOSIS — R0602 Shortness of breath: Secondary | ICD-10-CM

## 2011-09-14 DIAGNOSIS — I313 Pericardial effusion (noninflammatory): Secondary | ICD-10-CM | POA: Insufficient documentation

## 2011-09-14 NOTE — Assessment & Plan Note (Signed)
I will assess her mitral valve prolapse. I would not suspect significant regurgitation. Endocarditis prophylaxis is not indicated.

## 2011-09-14 NOTE — Assessment & Plan Note (Signed)
Her blood pressure is elevated today but she says this is unusual. She will continue the meds as listed. She should keep a blood pressure diary.

## 2011-09-14 NOTE — Assessment & Plan Note (Signed)
I suspect any pericardial effusion she had was related to her connective tissue disease. By her physical exam I would not suggest this was severe. I will follow this up with an echocardiogram.

## 2011-09-14 NOTE — Patient Instructions (Addendum)
The current medical regimen is effective;  continue present plan and medications.  Please have blood work today (BNP)  Your physician has requested that you have an echocardiogram. Echocardiography is a painless test that uses sound waves to create images of your heart. It provides your doctor with information about the size and shape of your heart and how well your heart's chambers and valves are working. This procedure takes approximately one hour. There are no restrictions for this procedure.  Follow up in 1 year with Dr Hochrein.  You will receive a letter in the mail 2 months before you are due.  Please call us when you receive this letter to schedule your follow up appointment.  

## 2011-09-14 NOTE — Assessment & Plan Note (Signed)
Her biggest complaint is dyspnea. This may be related to deconditioning. I doubt it is related to structural heart disease I will be evaluating with an echo as below. I will also check a BNP level. If these demonstrate no significant abnormalities she will be asked to exercise. If however her symptoms get worse with exercise regimen rather than better I would reevaluate possibly with stress testing. She does report that she had a negative nuclear stress test previously.

## 2011-09-14 NOTE — Progress Notes (Signed)
HPI The patient presents as a new patient for evaluation of a history of mitral valve prolapse and an apparent pericardial effusion.  She reports having had a mitral valve prolapse diagnosis for years and being told she needed to take antibiotics. She reports her last echo was about one year ago. She also reports that she was told she had pericardial effusion that time though it apparently was mild. The etiology of this is unclear though is possibly related to her connective tissue diseases. She has been noticing some shortness of breath. 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 She thinks she would be short of breath walking about 100 yards quickly. However, she's not exercising routinely. She does not describe resting shortness of breath, PND or orthopnea. She does not describe palpitations, presyncope or syncope. She has no chest pressure, neck discomfort she's had some mild chronic lower extremity swelling or she's had DVT but otherwise no edema.  Allergies  Allergen Reactions  . Codeine   . Demerol   . Ultram (Tramadol Hcl)     Current Outpatient Prescriptions  Medication Sig Dispense Refill  . aspirin 325 MG tablet Take 325 mg by mouth daily.      . cetirizine (ZYRTEC) 10 MG tablet Take 10 mg by mouth daily.      . DULoxetine (CYMBALTA) 20 MG capsule Take 20 mg by mouth daily.      Marland Kitchen esomeprazole (NEXIUM) 40 MG capsule Take 40 mg by mouth daily before breakfast.      . gabapentin (NEURONTIN) 100 MG capsule Take 100 mg by mouth 3 (three) times daily.      Marland Kitchen losartan (COZAAR) 100 MG tablet Take 100 mg by mouth daily. 1/2 tab      . montelukast (SINGULAIR) 10 MG tablet Take 10 mg by mouth daily.      . Montelukast Sodium (SINGULAIR PO) Take by mouth daily.      Marland Kitchen NIFEdipine (PROCARDIA-XL/ADALAT CC) 30 MG 24 hr tablet  Take 30 mg by mouth daily.      . progesterone (PROMETRIUM) 100 MG capsule Take 100 mg by mouth daily.      . rifaximin (XIFAXAN) 550 MG TABS Take 550 mg by mouth daily.      Marland Kitchen warfarin (COUMADIN) 5 MG tablet Take 5 mg by mouth as directed.        Past Medical History  Diagnosis Date  . DVT (deep venous thrombosis)   . CREST syndrome   . Pericardial effusion   . MVP (mitral valve prolapse)   . Antiphospholipid antibody syndrome   . ITP (idiopathic thrombocytopenic purpura)     Past Surgical History  Procedure Date  . Splenectomy   . Uterine ablation     Family History  Problem Relation Age of Onset  . Cancer Mother     Lung    History   Social History  . Marital Status: Married    Spouse Name: N/A    Number of Children: 2  . Years of Education: N/A   Occupational History  .     Social History Main Topics  . Smoking status: Never Smoker   . Smokeless tobacco: Not on file  . Alcohol Use: Not on file  . Drug Use: Not on file  . Sexually Active:  Not on file   Other Topics Concern  . Not on file   Social History Narrative   Two children and 3 step children and five grandchildren.  Nurse in the ICU    ROS:  Positive for diarrhea, reflux, leg cramps, joint pains.  Otherwise as stated in the HPI and negative for all other systems.  PHYSICAL EXAM BP 160/90  Ht 5\' 6"  (1.676 m)  Wt 174 lb (78.926 kg)  BMI 28.08 kg/m2 GENERAL:  Well appearing HEENT:  Pupils equal round and reactive, fundi not visualized, oral mucosa unremarkable NECK:  No jugular venous distention, waveform within normal limits, carotid upstroke brisk and symmetric, no bruits, no thyromegaly LYMPHATICS:  No cervical, inguinal adenopathy LUNGS:  Clear to auscultation bilaterally BACK:  No CVA tenderness CHEST:  Unremarkable HEART:  PMI not displaced or sustained,S1 and S2 within normal limits, no S3, no S4, no clicks, no rubs, no murmurs ABD:  Flat, positive bowel sounds normal in frequency in  pitch, no bruits, no rebound, no guarding, no midline pulsatile mass, no hepatomegaly, no splenomegaly (no spleen) EXT:  2 plus pulses throughout, no edema, no cyanosis no clubbing SKIN:  No rashes no nodules NEURO:  Cranial nerves II through XII grossly intact, motor grossly intact throughout PSYCH:  Cognitively intact, oriented to person place and time  EKG:  Sinus rhythm, rate 68, axis within normal limits, intervals within normal limits, no acute ST-T wave changes.   ASSESSMENT AND PLAN

## 2011-09-15 ENCOUNTER — Telehealth: Payer: Self-pay | Admitting: Cardiology

## 2011-09-15 ENCOUNTER — Telehealth: Payer: Self-pay | Admitting: *Deleted

## 2011-09-15 NOTE — Telephone Encounter (Signed)
New Problem:     Patient called in wanting the results of her blood work. Please call back.

## 2011-09-15 NOTE — Telephone Encounter (Signed)
2.8.13--notified ms Colver that we have an appoint with dr hochrein on Monday 2.11 at 10:45pm--nt

## 2011-09-15 NOTE — Telephone Encounter (Signed)
Message copied by Burnell Blanks on Fri Sep 15, 2011  1:40 PM ------      Message from: [name]      Created: Fri Sep 15, 2011 12:19 PM       Normal.  Call Ms. Dvorsky with the results and send results to Pearson Grippe, MD

## 2011-09-15 NOTE — Telephone Encounter (Signed)
Advised and sent to Dr Selena Batten

## 2011-09-15 NOTE — Telephone Encounter (Signed)
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

## 2011-09-15 NOTE — Telephone Encounter (Signed)
Patient called wanting to know results of BNP done 09/14/11.Patient was told Dr.has not reviewed yet ,BNP -33.0.

## 2011-09-15 NOTE — Telephone Encounter (Signed)
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

## 2011-09-18 ENCOUNTER — Encounter: Payer: Self-pay | Admitting: Cardiology

## 2011-09-18 ENCOUNTER — Telehealth: Payer: Self-pay | Admitting: Cardiology

## 2011-09-18 ENCOUNTER — Ambulatory Visit (INDEPENDENT_AMBULATORY_CARE_PROVIDER_SITE_OTHER): Payer: 59 | Admitting: Cardiology

## 2011-09-18 VITALS — BP 140/80 | HR 72 | Ht 66.0 in | Wt 177.0 lb

## 2011-09-18 DIAGNOSIS — I313 Pericardial effusion (noninflammatory): Secondary | ICD-10-CM

## 2011-09-18 DIAGNOSIS — I480 Paroxysmal atrial fibrillation: Secondary | ICD-10-CM

## 2011-09-18 DIAGNOSIS — I319 Disease of pericardium, unspecified: Secondary | ICD-10-CM

## 2011-09-18 DIAGNOSIS — I4891 Unspecified atrial fibrillation: Secondary | ICD-10-CM

## 2011-09-18 HISTORY — DX: Paroxysmal atrial fibrillation: I48.0

## 2011-09-18 LAB — BASIC METABOLIC PANEL
BUN: 17 mg/dL (ref 6–23)
CO2: 26 mEq/L (ref 19–32)
Chloride: 105 mEq/L (ref 96–112)
GFR: 101.63 mL/min (ref >60.00)
Glucose, Bld: 85 mg/dL (ref 70–99)
Potassium: 4 mEq/L (ref 3.5–5.1)
Sodium: 139 mEq/L (ref 135–145)

## 2011-09-18 LAB — MAGNESIUM: Magnesium: 1.9 mg/dL (ref 1.5–2.5)

## 2011-09-18 MED ORDER — METOPROLOL TARTRATE 25 MG PO TABS: ORAL_TABLET | ORAL | Status: DC

## 2011-09-18 NOTE — Assessment & Plan Note (Signed)
She has an echo pending.

## 2011-09-18 NOTE — Patient Instructions (Signed)
May take Metoprolol tartrate as needed up to 4 times a day for palps.  Continue all other medications as listed  Please have blood work today (TSH,BMP and MG)  Follow up as scheduled

## 2011-09-18 NOTE — Telephone Encounter (Signed)
Pt came for her appt

## 2011-09-18 NOTE — Assessment & Plan Note (Signed)
The patient presents with paroxysmal atrial fibrillation documented. This is only her second episode and the one was after a procedure. At this point I will give her when necessary beta blocker. She's already on anticoagulation. She has an echo pending as describing her recent note. I will check her electrolytes and TSH.

## 2011-09-18 NOTE — Telephone Encounter (Signed)
New Problem:      Patient called in today asking to speak with you about not making her appointment. Please call back.

## 2011-09-18 NOTE — Progress Notes (Signed)
   HPI The patient presents for an add-on evaluation after having an episode of atrial fibrillation last Friday. She was at work. She feels her heart going fast. She was hooked up to telemetry and found to be in atrial fibrillation. This persisted for an hour before going away spontaneously. She was able to keep working. She didn't describe any lightheadedness. She had no chest pressure, neck or arm discomfort. She has no shortness of breath, PND or orthopnea.   Allergies  Allergen Reactions  . Codeine   . Demerol   . Ultram (Tramadol Hcl)     Current Outpatient Prescriptions  Medication Sig Dispense Refill  . aspirin 325 MG tablet Take 325 mg by mouth daily.      . cetirizine (ZYRTEC) 10 MG tablet Take 10 mg by mouth daily.      . DULoxetine (CYMBALTA) 20 MG capsule Take 20 mg by mouth daily.      Marland Kitchen esomeprazole (NEXIUM) 40 MG capsule Take 40 mg by mouth daily before breakfast.      . gabapentin (NEURONTIN) 100 MG capsule Take 100 mg by mouth 3 (three) times daily.      Marland Kitchen losartan (COZAAR) 100 MG tablet Take 100 mg by mouth daily. 1/2 tab      . montelukast (SINGULAIR) 10 MG tablet Take 10 mg by mouth daily.      . Montelukast Sodium (SINGULAIR PO) Take by mouth daily.      Marland Kitchen NIFEdipine (PROCARDIA-XL/ADALAT CC) 30 MG 24 hr tablet Take 30 mg by mouth daily.      . progesterone (PROMETRIUM) 100 MG capsule Take 100 mg by mouth daily.      . rifaximin (XIFAXAN) 550 MG TABS Take 550 mg by mouth daily.      Marland Kitchen warfarin (COUMADIN) 5 MG tablet Take 5 mg by mouth as directed.        Past Medical History  Diagnosis Date  . DVT (deep venous thrombosis)   . CREST syndrome   . Pericardial effusion   . MVP (mitral valve prolapse)   . Antiphospholipid antibody syndrome   . ITP (idiopathic thrombocytopenic purpura)     Past Surgical History  Procedure Date  . Splenectomy   . Uterine ablation    ROS:  As stated in the HPI and negative for all other systems.  PHYSICAL EXAM BP 140/80   Pulse 72  Ht 5\' 6"  (1.676 m)  Wt 177 lb (80.287 kg)  BMI 28.57 kg/m2 GENERAL:  Well appearing HEENT:  Pupils equal round and reactive, fundi not visualized, oral mucosa unremarkable NECK:  No jugular venous distention, waveform within normal limits, carotid upstroke brisk and symmetric, no bruits, no thyromegaly LYMPHATICS:  No cervical, inguinal adenopathy LUNGS:  Clear to auscultation bilaterally BACK:  No CVA tenderness CHEST:  Unremarkable HEART:  PMI not displaced or sustained,S1 and S2 within normal limits, no S3, no S4, no clicks, no rubs, no murmurs ABD:  Flat, positive bowel sounds normal in frequency in pitch, no bruits, no rebound, no guarding, no midline pulsatile mass, no hepatomegaly, no splenomegaly EXT:  2 plus pulses throughout, no edema, no cyanosis no clubbing SKIN:  No rashes no nodules NEURO:  Cranial nerves II through XII grossly intact, motor grossly intact throughout PSYCH:  Cognitively intact, oriented to person place and time  ASSESSMENT AND PLAN

## 2011-09-27 ENCOUNTER — Other Ambulatory Visit: Payer: Self-pay

## 2011-09-27 ENCOUNTER — Ambulatory Visit (HOSPITAL_COMMUNITY): Payer: 59 | Attending: Cardiovascular Disease

## 2011-09-27 DIAGNOSIS — I313 Pericardial effusion (noninflammatory): Secondary | ICD-10-CM

## 2011-09-27 DIAGNOSIS — I379 Nonrheumatic pulmonary valve disorder, unspecified: Secondary | ICD-10-CM | POA: Insufficient documentation

## 2011-09-27 DIAGNOSIS — I4891 Unspecified atrial fibrillation: Secondary | ICD-10-CM | POA: Insufficient documentation

## 2011-09-27 DIAGNOSIS — I079 Rheumatic tricuspid valve disease, unspecified: Secondary | ICD-10-CM | POA: Insufficient documentation

## 2011-09-27 DIAGNOSIS — R0602 Shortness of breath: Secondary | ICD-10-CM

## 2011-11-07 ENCOUNTER — Encounter: Payer: Self-pay | Admitting: Cardiology

## 2012-01-24 ENCOUNTER — Encounter: Payer: Self-pay | Admitting: Cardiology

## 2012-08-08 ENCOUNTER — Ambulatory Visit
Admission: RE | Admit: 2012-08-08 | Discharge: 2012-08-08 | Disposition: A | Discharge status: Home / Self Care | Payer: 59 | Source: Ambulatory Visit | Attending: Internal Medicine | Admitting: Internal Medicine

## 2012-08-08 ENCOUNTER — Other Ambulatory Visit: Payer: Self-pay | Admitting: Internal Medicine

## 2012-08-08 DIAGNOSIS — M79606 Pain in leg, unspecified: Secondary | ICD-10-CM

## 2012-08-15 ENCOUNTER — Other Ambulatory Visit: Payer: Self-pay | Admitting: *Deleted

## 2012-08-15 ENCOUNTER — Other Ambulatory Visit (HOSPITAL_COMMUNITY): Payer: Self-pay | Admitting: Otolaryngology

## 2012-08-15 DIAGNOSIS — M79609 Pain in unspecified limb: Secondary | ICD-10-CM

## 2012-08-15 DIAGNOSIS — H9319 Tinnitus, unspecified ear: Secondary | ICD-10-CM

## 2012-08-15 DIAGNOSIS — I83893 Varicose veins of bilateral lower extremities with other complications: Secondary | ICD-10-CM

## 2012-08-15 DIAGNOSIS — M7989 Other specified soft tissue disorders: Secondary | ICD-10-CM

## 2012-08-15 DIAGNOSIS — Z86718 Personal history of other venous thrombosis and embolism: Secondary | ICD-10-CM

## 2012-08-15 DIAGNOSIS — D333 Benign neoplasm of cranial nerves: Secondary | ICD-10-CM

## 2012-08-15 DIAGNOSIS — R2689 Other abnormalities of gait and mobility: Secondary | ICD-10-CM

## 2012-08-20 ENCOUNTER — Ambulatory Visit (HOSPITAL_COMMUNITY): Payer: 59

## 2012-08-22 ENCOUNTER — Ambulatory Visit (HOSPITAL_COMMUNITY)
Admission: RE | Admit: 2012-08-22 | Discharge: 2012-08-22 | Disposition: A | Discharge status: Home / Self Care | Payer: 59 | Source: Ambulatory Visit | Attending: Otolaryngology | Admitting: Otolaryngology

## 2012-08-22 DIAGNOSIS — D333 Benign neoplasm of cranial nerves: Secondary | ICD-10-CM | POA: Insufficient documentation

## 2012-08-22 DIAGNOSIS — R269 Unspecified abnormalities of gait and mobility: Secondary | ICD-10-CM | POA: Insufficient documentation

## 2012-08-22 DIAGNOSIS — H9319 Tinnitus, unspecified ear: Secondary | ICD-10-CM | POA: Insufficient documentation

## 2012-08-22 DIAGNOSIS — R2689 Other abnormalities of gait and mobility: Secondary | ICD-10-CM

## 2012-08-22 MED ORDER — GADOBENATE DIMEGLUMINE 529 MG/ML IV SOLN
17.0000 mL | Freq: Once | INTRAVENOUS | Status: AC | PRN
  Administered 2012-08-22: 17 mL via INTRAVENOUS

## 2012-08-30 ENCOUNTER — Encounter: Payer: Self-pay | Admitting: Vascular Surgery

## 2012-09-02 ENCOUNTER — Encounter: Payer: Self-pay | Admitting: Vascular Surgery

## 2012-09-02 ENCOUNTER — Ambulatory Visit (INDEPENDENT_AMBULATORY_CARE_PROVIDER_SITE_OTHER): Payer: 59 | Admitting: Vascular Surgery

## 2012-09-02 ENCOUNTER — Encounter (INDEPENDENT_AMBULATORY_CARE_PROVIDER_SITE_OTHER): Payer: 59 | Admitting: *Deleted

## 2012-09-02 VITALS — BP 143/85 | HR 87 | Resp 18 | Ht 66.0 in | Wt 178.0 lb

## 2012-09-02 DIAGNOSIS — M7989 Other specified soft tissue disorders: Secondary | ICD-10-CM

## 2012-09-02 DIAGNOSIS — M79609 Pain in unspecified limb: Secondary | ICD-10-CM

## 2012-09-02 DIAGNOSIS — I87009 Postthrombotic syndrome without complications of unspecified extremity: Secondary | ICD-10-CM

## 2012-09-02 DIAGNOSIS — I83893 Varicose veins of bilateral lower extremities with other complications: Secondary | ICD-10-CM

## 2012-09-02 DIAGNOSIS — Z86718 Personal history of other venous thrombosis and embolism: Secondary | ICD-10-CM

## 2012-09-02 NOTE — Progress Notes (Signed)
Subjective:     Patient ID: [name], female   DOB: 07-20-1956, 57 y.o.   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 This patient who is diagnosed with lupus at age 57 had a DVT in the right leg in 1992. She was placed on anti-coagulation therapy. 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 She has been on Coumadin since that time with no further blood clots. She has no history of PE. She has had worsening edema over the last several months the right leg has no history of left leg edema. She denies any recent bleeding but has noted some varicosities and he has a small ulcer in the right ankle in the past she'll promptly. She denies claudication symptoms. She elevate her legs at night on the pillow Borza short-leg stocking on the right but developed severe edema as the day progresses particularly in the thigh above the stocking.  Past Medical History  Diagnosis Date  . DVT (deep venous thrombosis)   . CREST syndrome   . Pericardial effusion   . MVP (mitral valve prolapse)   . Antiphospholipid antibody syndrome   . ITP (idiopathic thrombocytopenic purpura)     History  Substance Use Topics  . Smoking status: Never Smoker   . Smokeless tobacco: Never Used  . Alcohol Use: Yes    Family History  Problem Relation Age of Onset  . Cancer Mother     Lung  . Diabetes Father   . COPD Father   . Hyperlipidemia Father   . Hypertension Father     Allergies  Allergen Reactions  . Codeine   . Demerol   . Ultram (Tramadol Hcl)     Current outpatient prescriptions:aspirin 325 MG tablet, Take 325 mg by mouth daily., Disp: , Rfl: ;  cetirizine (ZYRTEC) 10 MG tablet, Take 10 mg by mouth daily., Disp: , Rfl: ;  DULoxetine (CYMBALTA) 20 MG capsule, Take 20 mg by mouth daily., Disp: , Rfl: ;  esomeprazole (NEXIUM) 40 MG  capsule, Take 40 mg by mouth daily before breakfast., Disp: , Rfl:  gabapentin (NEURONTIN) 100 MG capsule, Take 100 mg by mouth 3 (three) times daily., Disp: , Rfl: ;  losartan (COZAAR) 100 MG tablet, Take 100 mg by mouth daily. 1/2 tab, Disp: , Rfl: ;  metoprolol tartrate (LOPRESSOR) 25 MG tablet, May take up to 4 times a day as needed for palps, Disp: 120 tablet, Rfl: 3;  montelukast (SINGULAIR) 10 MG tablet, Take 10 mg by mouth daily., Disp: , Rfl:  Montelukast Sodium (SINGULAIR PO), Take by mouth daily., Disp: , Rfl: ;  NIFEdipine (PROCARDIA-XL/ADALAT CC) 30 MG 24 hr tablet, Take 30 mg by mouth daily., Disp: , Rfl: ;  progesterone (PROMETRIUM) 100 MG capsule, Take 100 mg by mouth daily., Disp: , Rfl: ;  rifaximin (XIFAXAN) 550 MG TABS, Take 550 mg by mouth daily., Disp: , Rfl: ;  warfarin (COUMADIN) 5 MG tablet, Take 5 mg by mouth as directed., Disp: , Rfl:   BP 143/85  Pulse 87  Resp 18  Ht 5\' 6"  (1.676 m)  Wt 178 lb (80.74 kg)  BMI 28.73 kg/m2  Body mass index is 28.73 kg/(m^2).           Review of Systems denies chest pain, dyspnea on exertion, PND, orthopnea. Does have  occasional swallowing problems. All other systems negative except for present illness     Objective:   Physical Exam blood pressure 143 race 5 heart rate 87 respirations 18 Gen.-alert and oriented x3 in no apparent distress HEENT normal for age Lungs no rhonchi or wheezing Cardiovascular regular rhythm no murmurs carotid pulses 3+ palpable no bruits audible Abdomen soft nontender no palpable masses Musculoskeletal free of  major deformities Skin clear -no rashes Neurologic normal Lower extremities 3+ femoral and 2+ dorsalis pedis pulses palpable bilaterally with 1+ edema right leg no edema left leg. Right leg with early hyperpigmentation lower third of leg but no active ulcer. A few bulging varicosities in the right medial calf posteriorly. Left leg is free varicosities.  Today I ordered a venous duplex  exam the right leg which are reviewed and interpreted. There is incomplete recanalization of the right lower extremity deep venous system particularly in the femoral and popliteal veins but no complete obstruction noted. There is gross reflux throughout the right deep system and the right great saphenous system.     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 #4 patient should not have laser ablation of right great saphenous vein because of deep venous problems and possibility of further DVTs-superficial venous system should be left intact as collateral for deep venous system

## 2012-09-10 ENCOUNTER — Other Ambulatory Visit (HOSPITAL_COMMUNITY): Payer: Self-pay | Admitting: Orthopedic Surgery

## 2012-09-10 DIAGNOSIS — M25561 Pain in right knee: Secondary | ICD-10-CM

## 2012-09-18 ENCOUNTER — Ambulatory Visit (HOSPITAL_COMMUNITY): Admission: RE | Admit: 2012-09-18 | Payer: 59 | Source: Ambulatory Visit

## 2012-09-24 ENCOUNTER — Ambulatory Visit (HOSPITAL_COMMUNITY): Payer: 59

## 2012-10-10 ENCOUNTER — Inpatient Hospital Stay (HOSPITAL_COMMUNITY): Payer: PRIVATE HEALTH INSURANCE

## 2012-10-10 ENCOUNTER — Inpatient Hospital Stay (HOSPITAL_COMMUNITY)
Admission: EM | Admit: 2012-10-10 | Discharge: 2012-10-11 | DRG: 563 | Disposition: A | Discharge status: Home / Self Care | Payer: PRIVATE HEALTH INSURANCE | Attending: Internal Medicine | Admitting: Internal Medicine

## 2012-10-10 ENCOUNTER — Emergency Department (HOSPITAL_COMMUNITY): Payer: PRIVATE HEALTH INSURANCE

## 2012-10-10 ENCOUNTER — Encounter (HOSPITAL_COMMUNITY): Payer: Self-pay | Admitting: Pulmonary Disease

## 2012-10-10 DIAGNOSIS — I059 Rheumatic mitral valve disease, unspecified: Secondary | ICD-10-CM | POA: Diagnosis present

## 2012-10-10 DIAGNOSIS — R0600 Dyspnea, unspecified: Secondary | ICD-10-CM

## 2012-10-10 DIAGNOSIS — D693 Immune thrombocytopenic purpura: Secondary | ICD-10-CM | POA: Diagnosis present

## 2012-10-10 DIAGNOSIS — I313 Pericardial effusion (noninflammatory): Secondary | ICD-10-CM

## 2012-10-10 DIAGNOSIS — W010XXA Fall on same level from slipping, tripping and stumbling without subsequent striking against object, initial encounter: Secondary | ICD-10-CM | POA: Diagnosis present

## 2012-10-10 DIAGNOSIS — D6859 Other primary thrombophilia: Secondary | ICD-10-CM | POA: Diagnosis present

## 2012-10-10 DIAGNOSIS — I87009 Postthrombotic syndrome without complications of unspecified extremity: Secondary | ICD-10-CM

## 2012-10-10 DIAGNOSIS — S0181XA Laceration without foreign body of other part of head, initial encounter: Secondary | ICD-10-CM

## 2012-10-10 DIAGNOSIS — M349 Systemic sclerosis, unspecified: Secondary | ICD-10-CM | POA: Diagnosis present

## 2012-10-10 DIAGNOSIS — S0180XA Unspecified open wound of other part of head, initial encounter: Secondary | ICD-10-CM | POA: Diagnosis present

## 2012-10-10 DIAGNOSIS — I10 Essential (primary) hypertension: Secondary | ICD-10-CM | POA: Diagnosis present

## 2012-10-10 DIAGNOSIS — I7300 Raynaud's syndrome without gangrene: Secondary | ICD-10-CM | POA: Diagnosis present

## 2012-10-10 DIAGNOSIS — Z7982 Long term (current) use of aspirin: Secondary | ICD-10-CM

## 2012-10-10 DIAGNOSIS — I4891 Unspecified atrial fibrillation: Secondary | ICD-10-CM

## 2012-10-10 DIAGNOSIS — S52599A Other fractures of lower end of unspecified radius, initial encounter for closed fracture: Principal | ICD-10-CM | POA: Diagnosis present

## 2012-10-10 DIAGNOSIS — Y990 Civilian activity done for income or pay: Secondary | ICD-10-CM

## 2012-10-10 DIAGNOSIS — Y9210 Unspecified residential institution as the place of occurrence of the external cause: Secondary | ICD-10-CM | POA: Diagnosis present

## 2012-10-10 DIAGNOSIS — I341 Nonrheumatic mitral (valve) prolapse: Secondary | ICD-10-CM

## 2012-10-10 DIAGNOSIS — R760 Raised antibody titer: Secondary | ICD-10-CM | POA: Diagnosis present

## 2012-10-10 DIAGNOSIS — W19XXXA Unspecified fall, initial encounter: Secondary | ICD-10-CM

## 2012-10-10 DIAGNOSIS — I83893 Varicose veins of bilateral lower extremities with other complications: Secondary | ICD-10-CM

## 2012-10-10 DIAGNOSIS — S0990XA Unspecified injury of head, initial encounter: Secondary | ICD-10-CM

## 2012-10-10 DIAGNOSIS — Z79899 Other long term (current) drug therapy: Secondary | ICD-10-CM

## 2012-10-10 DIAGNOSIS — Z7901 Long term (current) use of anticoagulants: Secondary | ICD-10-CM

## 2012-10-10 DIAGNOSIS — S62113A Displaced fracture of triquetrum [cuneiform] bone, unspecified wrist, initial encounter for closed fracture: Secondary | ICD-10-CM | POA: Diagnosis present

## 2012-10-10 LAB — CBC
HCT: 39.5 % (ref 36.0–46.0)
MCH: 29.8 pg (ref 26.0–34.0)
MCV: 86.6 fL (ref 78.0–100.0)
RDW: 14.5 % (ref 11.5–15.5)
WBC: 8.5 10*3/uL (ref 4.0–10.5)

## 2012-10-10 LAB — BASIC METABOLIC PANEL
BUN: 11 mg/dL (ref 6–23)
Chloride: 104 mEq/L (ref 96–112)
Creatinine, Ser: 0.69 mg/dL (ref 0.50–1.10)
GFR calc Af Amer: >90 mL/min (ref >90)

## 2012-10-10 MED ORDER — HYDROCODONE-ACETAMINOPHEN 5-325 MG PO TABS: 2.0000 | ORAL_TABLET | Freq: Once | ORAL | Status: DC

## 2012-10-10 MED ORDER — POTASSIUM CHLORIDE CRYS ER 20 MEQ PO TBCR
20.0000 meq | EXTENDED_RELEASE_TABLET | Freq: Once | ORAL | Status: AC
  Administered 2012-10-10: 20 meq via ORAL
  Filled 2012-10-10: qty 1

## 2012-10-10 MED ORDER — MUPIROCIN 2 % EX OINT
1.0000 "application " | TOPICAL_OINTMENT | Freq: Two times a day (BID) | CUTANEOUS | Status: DC
  Administered 2012-10-10 – 2012-10-11 (×2): 1 via NASAL
  Filled 2012-10-10: qty 22

## 2012-10-10 MED ORDER — NON FORMULARY: 40.0000 mg | Freq: Two times a day (BID) | Status: DC

## 2012-10-10 MED ORDER — SODIUM CHLORIDE 0.9 % IV SOLN: 250.0000 mL | INTRAVENOUS | Status: DC | PRN

## 2012-10-10 MED ORDER — NON FORMULARY: 40.0000 mg | Freq: Every day | Status: DC

## 2012-10-10 MED ORDER — ASPIRIN 325 MG PO TABS: 325.0000 mg | ORAL_TABLET | Freq: Every day | ORAL | Status: DC

## 2012-10-10 MED ORDER — WARFARIN SODIUM 7.5 MG PO TABS: 7.5000 mg | ORAL_TABLET | ORAL | Status: DC

## 2012-10-10 MED ORDER — LOSARTAN POTASSIUM 50 MG PO TABS
50.0000 mg | ORAL_TABLET | Freq: Every day | ORAL | Status: DC
  Administered 2012-10-11: 50 mg via ORAL
  Filled 2012-10-10: qty 1

## 2012-10-10 MED ORDER — GABAPENTIN 100 MG PO CAPS
100.0000 mg | ORAL_CAPSULE | Freq: Three times a day (TID) | ORAL | Status: DC
  Administered 2012-10-10 – 2012-10-11 (×3): 100 mg via ORAL
  Filled 2012-10-10 (×5): qty 1

## 2012-10-10 MED ORDER — PROGESTERONE MICRONIZED 100 MG PO CAPS
100.0000 mg | ORAL_CAPSULE | Freq: Every day | ORAL | Status: DC
  Administered 2012-10-10 – 2012-10-11 (×2): 100 mg via ORAL
  Filled 2012-10-10 (×2): qty 1

## 2012-10-10 MED ORDER — ESOMEPRAZOLE MAGNESIUM 40 MG PO CPDR
40.0000 mg | DELAYED_RELEASE_CAPSULE | Freq: Two times a day (BID) | ORAL | Status: DC
  Administered 2012-10-10 – 2012-10-11 (×2): 40 mg via ORAL
  Filled 2012-10-10 (×4): qty 1

## 2012-10-10 MED ORDER — CHLORHEXIDINE GLUCONATE CLOTH 2 % EX PADS
6.0000 | MEDICATED_PAD | Freq: Every day | CUTANEOUS | Status: DC
  Administered 2012-10-11: 6 via TOPICAL

## 2012-10-10 MED ORDER — HYDROCODONE-ACETAMINOPHEN 5-325 MG PO TABS
1.0000 | ORAL_TABLET | Freq: Once | ORAL | Status: AC
  Administered 2012-10-10: 1 via ORAL
  Filled 2012-10-10: qty 1

## 2012-10-10 MED ORDER — HYDROCODONE-ACETAMINOPHEN 5-325 MG PO TABS
1.0000 | ORAL_TABLET | Freq: Four times a day (QID) | ORAL | Status: DC | PRN
  Administered 2012-10-10 – 2012-10-11 (×4): 2 via ORAL
  Filled 2012-10-10 (×4): qty 2

## 2012-10-10 MED ORDER — SODIUM CHLORIDE 0.9 % IV SOLN: INTRAVENOUS | Status: DC

## 2012-10-10 MED ORDER — WARFARIN - PHYSICIAN DOSING INPATIENT: Freq: Every day | Status: DC

## 2012-10-10 MED ORDER — WARFARIN SODIUM 5 MG PO TABS: 5.0000 mg | ORAL_TABLET | ORAL | Status: DC

## 2012-10-10 MED ORDER — RIFAXIMIN 550 MG PO TABS
550.0000 mg | ORAL_TABLET | Freq: Every day | ORAL | Status: DC
  Administered 2012-10-11: 550 mg via ORAL
  Filled 2012-10-10: qty 1

## 2012-10-10 MED ORDER — NIFEDIPINE ER 30 MG PO TB24
30.0000 mg | ORAL_TABLET | Freq: Every day | ORAL | Status: DC
  Administered 2012-10-11: 30 mg via ORAL
  Filled 2012-10-10: qty 1

## 2012-10-10 NOTE — ED Provider Notes (Signed)
History     CSN: [id number]  Arrival date & time 10/10/12  1239   First MD Initiated Contact with Patient 10/10/12 1310      No chief complaint on file.   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 Is not sure if she hit her head on the door frame or the floor. Did not experience LOC.  Pt is also complaining of right wrist pain from the fall.  No back pain or neurological deficits at this time.  Denies any chest pain or SOB.  Pt is on daily Coumadin 7.5mg  Monday-Saturday, 5mg  on Sunday.  The history is provided by the patient.    Past Medical History  Diagnosis Date  . DVT (deep venous thrombosis)   . CREST syndrome   . Pericardial effusion   . MVP (mitral valve prolapse)   . Antiphospholipid antibody syndrome   . ITP (idiopathic thrombocytopenic purpura)     Past Surgical History  Procedure Laterality Date  . Splenectomy    . Uterine ablation      Family History  Problem Relation Age of Onset  . Cancer Mother     Lung  . Diabetes Father   . COPD Father   . Hyperlipidemia Father   . Hypertension Father     History  Substance Use Topics  . Smoking status: Never Smoker   . Smokeless tobacco: Never Used  . Alcohol Use: Yes    OB History   Grav Para Term Preterm Abortions TAB SAB Ect Mult Living                  Review of Systems  Musculoskeletal: Positive for arthralgias.  Skin: Positive for wound.  All other systems reviewed and are negative.    Allergies  Codeine; Demerol; and Ultram  Home Medications   Current Outpatient Rx  Name  Route  Sig  Dispense  Refill  . aspirin 325 MG tablet   Oral   Take 325 mg by mouth daily.         . cetirizine (ZYRTEC) 10 MG tablet   Oral   Take 10 mg by mouth daily.         . DULoxetine (CYMBALTA) 20 MG capsule  Oral   Take 20 mg by mouth daily.         Marland Kitchen esomeprazole (NEXIUM) 40 MG capsule   Oral   Take 40 mg by mouth daily before breakfast.         . gabapentin (NEURONTIN) 100 MG capsule   Oral   Take 100 mg by mouth 3 (three) times daily.         Marland Kitchen losartan (COZAAR) 100 MG tablet   Oral   Take 100 mg by mouth daily. 1/2 tab         . metoprolol tartrate (LOPRESSOR) 25 MG tablet      May take up to 4 times a day as needed for palps   120 tablet   3   . montelukast (SINGULAIR) 10 MG tablet   Oral   Take 10 mg by mouth daily.         . Montelukast Sodium (SINGULAIR PO)   Oral   Take by mouth daily.         Marland Kitchen NIFEdipine (PROCARDIA-XL/ADALAT CC) 30 MG 24 hr tablet  Oral   Take 30 mg by mouth daily.         . progesterone (PROMETRIUM) 100 MG capsule   Oral   Take 100 mg by mouth daily.         . rifaximin (XIFAXAN) 550 MG TABS   Oral   Take 550 mg by mouth daily.         Marland Kitchen warfarin (COUMADIN) 5 MG tablet   Oral   Take 5 mg by mouth as directed.           There were no vitals taken for this visit.  Physical Exam  Nursing note and vitals reviewed. Constitutional: She is oriented to person, place, and time. She appears well-developed and well-nourished.  HENT:  Head: Normocephalic and atraumatic.  Mouth/Throat: Oropharynx is clear and moist.  Eyes: Conjunctivae and EOM are normal. Pupils are equal, round, and reactive to light.    Neck: Normal range of motion. Neck supple.  Cardiovascular: Normal rate, regular rhythm and normal heart sounds.   Pulmonary/Chest: Effort normal and breath sounds normal.  Abdominal: Soft. Bowel sounds are normal. There is no tenderness.  Neurological: She is alert and oriented to person, place, and time. She has normal strength. No cranial nerve deficit or sensory deficit.  Skin: Skin is warm and dry.  Psychiatric: She has a normal mood and affect. Her behavior is normal.    ED Course  Procedures (including  critical care time)  LACERATION REPAIR Performed by: Garlon Hatchet Authorized by: Garlon Hatchet Consent: Verbal consent obtained. Risks and benefits: risks, benefits and alternatives were discussed Consent given by: patient Patient identity confirmed: provided demographic data Prepped and Draped in normal sterile fashion Wound explored  Laceration Location: right superior orbit  Laceration Length: 4cm  No Foreign Bodies seen or palpated  Anesthesia: local infiltration  Local anesthetic: lidocaine 2% with epinephrine  Anesthetic total: 3 ml  Irrigation method: syringe Amount of cleaning: standard  Skin closure: 5-0 Ethilon  Number of sutures: 3  Technique: simple interrupted  Patient tolerance: Patient tolerated the procedure well with no immediate complications.  Labs Reviewed  APTT - Abnormal; Notable for the following:    aPTT 41 (*)    All other components within normal limits  PROTIME-INR - Abnormal; Notable for the following:    Prothrombin Time 19.4 (*)    INR 1.70 (*)    All other components within normal limits  CBC - Abnormal; Notable for the following:    Platelets 441 (*)    All other components within normal limits  BASIC METABOLIC PANEL - Abnormal; Notable for the following:    Potassium 3.4 (*)    Calcium 10.7 (*)    All other components within normal limits  MRSA PCR SCREENING  TYPE AND SCREEN  ABO/RH   Dg Wrist Complete Right  10/10/2012  *RADIOLOGY REPORT*  Clinical Data: Fall, wrist pain and discomfort, broke fall with wrist  RIGHT WRIST - COMPLETE 3+ VIEW  Comparison: None.  Findings: Osseous mineralization normal. Joint spaces preserved. Minimally irregular density within the proximal lunate question lunatomalacia. On the lateral view, an irregular bony density is seen dorsal to the triquetrum question triquetral fracture. Mild soft tissue swelling dorsum of wrist. Nondisplaced radial styloid fracture noted. No additional fracture or  dislocation identified.  IMPRESSION: Nondisplaced radial styloid fracture. Question fracture at dorsal margin of triquetrum.  Findings called to Dr. Tyson Alias on 03/6/32014 at 1346 hrs.   Original Report Authenticated By: Ulyses Southward,  M.D.    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  Multiplanar CT image reconstructions of the cervical spine were also generated.  Comparison:  None Correlation:  MR brain 08/22/2038  CT HEAD  Findings: Diffuse prominence of the ventricular system similar to prior MRI question normal pressure hydrocephalus. Brain parenchyma otherwise normal appearance. No midline shift or mass effect. No intracranial hemorrhage, mass lesion or evidence of acute infarction. No definite extra-axial fluid collections. Skull intact. Visualized paranasal sinuses and mastoid air cells clear.  IMPRESSION: No acute intracranial abnormalities. Diffuse dilatation of the ventricular system similar to prior MRI question normal pressure hydrocephalus.  CT CERVICAL SPINE  Findings: Visualized skull base intact. Congenital C2-C3 fusion. Incomplete posterior C1, normal variant. Vertebral body and disc space heights otherwise maintained. Prevertebral soft tissues normal thickness. No acute fracture, subluxation, or bone destruction. Lung apices clear. Gaseous distention of the proximal thoracic esophagus. Marked enlargement of left thyroid lobe, extending beyond imaged field, measuring at least 5.9 cm transverse and greater than 4.3 cm AP and containing multiple nodules, several of which are calcified. Displacement of the trachea and esophagus left to right. Low attenuation  nodule question complicated cystic lesion at the left thyroid lobe measures 2.6 x 2.3 cm. Numerous scattered normal-sized cervical lymph nodes.  IMPRESSION: No acute cervical spine abnormalities.  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 Gaseous distention of the proximal thoracic esophagus question related to known diagnosis of CREST syndrome.  Marked multilateral enlargement of the left thyroid lobe; at least portions of this are present on a prior ultrasound of 04/09/2010. Prior ultrasound demonstrated a 6 cm left lobe nodule but unable to demonstrate stability by incomplete CT imaging today; unless the dominant masses have been previously biopsied, recommend follow-up non emergent ultrasound imaging to evaluate.   Original Report Authenticated By: Ulyses Southward, M.D.    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  Multiplanar CT image reconstructions of the cervical spine were also generated.  Comparison:  None Correlation:  MR brain 08/22/2038  CT HEAD  Findings: Diffuse prominence of the ventricular system similar to prior MRI question normal pressure hydrocephalus. Brain parenchyma otherwise normal appearance. No midline shift or mass effect. No intracranial hemorrhage, mass lesion or evidence of acute infarction. No definite extra-axial fluid collections. Skull intact. Visualized paranasal sinuses and mastoid air cells clear.  IMPRESSION: No acute intracranial abnormalities. Diffuse dilatation of the ventricular system similar to prior MRI question normal pressure hydrocephalus.  CT CERVICAL SPINE  Findings: Visualized skull base intact. Congenital  C2-C3 fusion. Incomplete posterior C1, normal variant. Vertebral body and disc space heights otherwise maintained. Prevertebral soft tissues normal thickness. No acute fracture, subluxation, or bone destruction. Lung apices clear. Gaseous distention of the proximal thoracic esophagus. Marked enlargement of left thyroid lobe, extending beyond imaged field, measuring at least 5.9 cm transverse and greater than 4.3 cm AP and  containing multiple nodules, several of which are calcified. Displacement of the trachea and esophagus left to right. Low attenuation nodule question complicated cystic lesion at the left thyroid lobe measures 2.6 x 2.3 cm. Numerous scattered normal-sized cervical lymph nodes.  IMPRESSION: No acute cervical spine abnormalities.  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 Gaseous distention of the proximal thoracic esophagus question related to known diagnosis of CREST syndrome.  Marked multilateral enlargement of the left thyroid lobe; at least portions of this are present on a prior ultrasound of 04/09/2010. Prior ultrasound demonstrated a 6 cm left lobe nodule but unable to demonstrate stability by incomplete CT imaging today; unless the dominant masses have been previously biopsied, recommend follow-up non emergent ultrasound imaging to evaluate.   Original Report Authenticated By: Ulyses Southward, M.D.      No diagnosis found.    MDM    57 y.o. 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  Did not experience LOC.  Pt is on daily coumadin.  CT negative for bleed. Small 3cm laceration of right superior orbit repaired in the ED with good approximation of tissue and hemostasis.  Pt also has fx of right radial styloid and possible fx of triquetrium.  Hand surgeon evaluated pt in ED- will get wrist CT to further evaluate fractures.  Pt will be admitted to critical care team  for close observation and disposition.  Garlon Hatchet, PA-C 10/10/12 1553

## 2012-10-10 NOTE — H&P (Signed)
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  No LOC. Admitted for monitoring of mental status, concern for head bleed in setting of coumadin therapy / fall.    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  No LOC, or focal neuro deficits post fall.  No loss of bowel / bladder.  Complains of pain in R wrist and face related to laceration.  Denies headache, nausea, vomiting, shortness of breath, chest pain.  Admitted for monitoring of mental status, concern for head bleed in setting of coumadin therapy / fall.   PAST MEDICAL HISTORY :  Past Medical History  Diagnosis Date  . DVT (deep venous thrombosis)   . CREST syndrome   . Pericardial effusion   . MVP (mitral valve prolapse)   . Antiphospholipid antibody syndrome   . ITP (idiopathic thrombocytopenic purpura)    Past Surgical History  Procedure Laterality Date  . Splenectomy    . Uterine ablation     Prior to Admission medications   Medication Sig Start Date End Date Taking? Authorizing [name]  aspirin 325 MG tablet Take 325 mg by mouth daily.    Historical [name], MD  cetirizine (ZYRTEC) 10 MG tablet Take 10 mg by mouth daily.    Historical [name], MD  DULoxetine (CYMBALTA) 20 MG capsule Take 20 mg by mouth daily.    Historical [name], MD  esomeprazole (NEXIUM) 40 MG capsule Take 40 mg by mouth daily before breakfast.    Historical [name],  MD  gabapentin (NEURONTIN) 100 MG capsule Take 100 mg by mouth 3 (three) times daily.    Historical [name], MD  losartan (COZAAR) 100 MG tablet Take 100 mg by mouth daily. 1/2 tab    Historical [name], MD  metoprolol tartrate (LOPRESSOR) 25 MG tablet May take up to 4 times a day as needed for palps 09/18/11   [name], MD  montelukast (SINGULAIR) 10 MG tablet Take 10 mg by mouth daily.    Historical [name], MD  Montelukast Sodium (SINGULAIR PO) Take by mouth daily.    Historical [name], MD  NIFEdipine (PROCARDIA-XL/ADALAT CC) 30 MG 24 hr tablet Take 30 mg by mouth daily.    Historical [name], MD  progesterone (PROMETRIUM) 100 MG capsule Take 100 mg by mouth daily.    Historical [name], MD  rifaximin (XIFAXAN) 550 MG TABS Take 550 mg by mouth daily.    Historical [name], MD  warfarin (COUMADIN) 5 MG tablet Take 5 mg by mouth as directed.    Historical Raywood Wailes, MD   Allergies  Allergen Reactions  . Codeine   . Demerol   . Ultram (Tramadol Hcl)     FAMILY HISTORY:  Family History  Problem Relation Age of Onset  . Cancer Mother     Lung  . Diabetes Father   . COPD Father   . Hyperlipidemia Father   . Hypertension Father  SOCIAL HISTORY:  reports that she has never smoked. She has never used smokeless tobacco. She reports that  drinks alcohol. She reports that she does not use illicit drugs.  REVIEW OF SYSTEMS:   All systems reviewed and are negative.  Pertinent positives in HPI.    SUBJECTIVE:  Complains of wrist pain, right facial pain  VITAL SIGNS: Temp:  [98 F (36.7 C)] 98 F (36.7 C) (03/06 1320) Pulse Rate:  [79] 79 (03/06 1320) Resp:  [16] 16 (03/06 1320) BP: (121)/(74) 121/74 mmHg (03/06 1320) SpO2:  [100 %] 100 % (03/06 1320)  INTAKE / OUTPUT: Intake/Output   None     PHYSICAL EXAMINATION: General:  wdwn adult female in NAD Neuro:  AAOx4, pupils =R, no focal deficits, MAE HEENT:  Mm pink/moist, no neck tenderness to  palpation Cardiovascular:  s1s2 rrr, no m/r/g Lungs:  resp's even/non-labored, lungs bilaterally clear Abdomen:  Round/soft, bsx4 active Musculoskeletal:  No acute deformities, R wrist pain to palpation Skin:  Warm/dry, no edema  LABS:  Recent Labs Lab 10/10/12 1258  APTT 41*  INR 1.70*   No results found for this basename: GLUCAP,  in the last 168 hours    ASSESSMENT / PLAN:  NEUROLOGIC A:   Status Post Fall - fall 3/6 while at work, striking head on floor with laceration above R eye.  CT head / neck negative.  Negative on exam to palpation.  Right Eye Laceration  P:   -Appreciate Neurosurgical Consultation -Neuro checks Q2 hours -No need for further CT imaging of head unless acute neuro change -facial lac to be sutured per ER - appreciate assistance -cervical spine cleared via CT / clinical exam NS recs no coum x 72 hrs then re assess ct prior to restart  ORTHO A:  Nondisplaced radial styloid fracture  Questionable fracture at dorsal margin of triquetrum  P: -to follow up with Dr. Betha Loa 3/7 afternoon -remains with good pulses at radial etc -PRN Vicodin for pain CT wrist  CARDIOVASCULAR A:  HTN Antiphospholipid Antibody Positive   Scleroderma / CREST Syndrome Hx of DVT x2- on chronic coumadin   P:  -continue home procardia, cozaar -type and screen -coumadin hold, inr in am  -follow up EKG  GASTROINTESTINAL A:  GERD   P:   -continue home nexium, xifaxan  HEMATOLOGIC A:   Chronic Anti-coagulation  P:  -follow up PT/INR -Will clarify coumadin plans with NS in am    Called Dr. Selena Batten (PCP) to update on status.  Appt arranged with Dr. Betha Loa for 3/7 2pm.   I have personally obtained a history, examined the patient, evaluated laboratory and imaging results, formulated the assessment and plan and placed orders.  Canary Brim, NP-C Pulmonary and Critical Care Medicine [place] Pager: [phone]  10/10/2012, 1:41 PM  I  have fully examined this patient and agree with above findings.    And edited in full  Update Bonnie husband in full  Mcarthur Barber. Bonnie Alias, MD, FACP Pgr: [phone] [place]

## 2012-10-10 NOTE — Progress Notes (Signed)
Called to 2100 after employee fell.  Upon arrival [facility] was lying on the floor, staff had gauze applied to forehead.  Dr Tyson Alias present.  Ramia c/o of headache, right forehead also pain in her right wrist.  Back board and c- collar received from ED.  Ronna was log rolled onto back board with c-collar in place.  Transported to radiology for head CT and Cspine, completed.  Then transported to ED room A8.  ED rn updated, questions answered. Wrist Xray done. Cell phone and earrings given to husband at bedside.

## 2012-10-10 NOTE — ED Provider Notes (Signed)
Pt presents after falling in the ICU when she slipped - struck her head and has resultant laceration.  She has had imaging that was ordered by ICU physicians without ICH or fractured spine - have added xray of wrist showing small radial styloid fracture and ? Carpal frx.  She otherwise appears stable and has no back pain, no numbness, weakness, cough, sob or other c/o.  On exam has soft abd, clear heart and lung sounds, normal pupillary exam, speech and coordination.  She has pain in the R wrist with ROM but no obvoius swelling.  Laceration to be repaired by Rickey Barbara,   To be admitted by critical care at their request.  Stable at this time.  Medical screening examination/treatment/procedure(s) were conducted as a shared visit with non-physician practitioner(s) and myself.  I personally evaluated the patient during the encounter    Vida Roller, MD 10/10/12 1357

## 2012-10-10 NOTE — ED Notes (Signed)
Per Nursing staff from 2100 Patient slipped on wet floor  hitting her head, No loc pt has lac above right eyebrow , Rapid response called. Patient is on coumadin.

## 2012-10-10 NOTE — Progress Notes (Signed)
Patient complaining of 7/10 wrist pain.  No pain medications are due to give. Dr. Vassie Loll called and made aware.  Ordered to give 2 tablets of Vicodin early.  Will continue to monitor. [name]

## 2012-10-10 NOTE — Progress Notes (Signed)
eLink Physician-Brief Progress Note Patient Name: [name] DOB: 06-04-1956 MRN: [id number]  Date of Service  10/10/2012   HPI/Events of Note     eICU Interventions  XR rt hip & knee ordered -co pain at these sites   Intervention Category Intermediate Interventions: Diagnostic test evaluation  [name] V. 10/10/2012, 3:52 PM

## 2012-10-10 NOTE — Consult Note (Signed)
[name] is an 57 y.o. female.   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  Injury to right wrist and head.  XR of wrist in [facility] show triquetral avulsion fracture and radial styloid fracture.  Reports no previous injury to right wrist and no injury to left arm.  Past Medical History  Diagnosis Date  . DVT (deep venous thrombosis)   . CREST syndrome   . Pericardial effusion   . MVP (mitral valve prolapse)   . Antiphospholipid antibody syndrome   . ITP (idiopathic thrombocytopenic purpura)     Past Surgical History  Procedure Laterality Date  . Splenectomy    . Uterine ablation      Family History  Problem Relation Age of Onset  . Cancer Mother     Lung  . Diabetes Father   . COPD Father   . Hyperlipidemia Father   . Hypertension Father    Social History:  reports that she has never smoked. She has never used smokeless tobacco. She reports that  drinks alcohol. She reports that she does not use illicit drugs.  Allergies:  Allergies  Allergen Reactions  . Codeine Nausea And Vomiting  . Demerol   . Ultram (Tramadol Hcl) Other (See Comments)    Makes her hands shake.     (Not in a hospital admission)  Results for orders placed during the hospital encounter of 10/10/12 (from the past 48 hour(s))  APTT     Status: Abnormal   Collection Time    10/10/12 12:58 PM      Result Value Range   aPTT 41 (*) 24 - 37 seconds   Comment:            IF BASELINE aPTT IS ELEVATED,     SUGGEST PATIENT RISK ASSESSMENT     BE USED TO DETERMINE APPROPRIATE     ANTICOAGULANT THERAPY.  PROTIME-INR     Status: Abnormal   Collection Time    10/10/12 12:58 PM      Result Value Range   Prothrombin Time 19.4 (*) 11.6 - 15.2 seconds   INR 1.70 (*) 0.00 - 1.49  CBC     Status: Abnormal   Collection Time    10/10/12  1:40 PM      Result Value Range   WBC 8.5  4.0 - 10.5 K/uL   RBC 4.56  3.87 - 5.11 MIL/uL    Hemoglobin 13.6  12.0 - 15.0 g/dL   HCT 84.6  96.2 - 95.2 %   MCV 86.6  78.0 - 100.0 fL   MCH 29.8  26.0 - 34.0 pg   MCHC 34.4  30.0 - 36.0 g/dL   RDW 84.1  32.4 - 40.1 %   Platelets 441 (*) 150 - 400 K/uL  BASIC METABOLIC PANEL     Status: Abnormal   Collection Time    10/10/12  1:40 PM      Result Value Range   Sodium 139  135 - 145 mEq/L   Potassium 3.4 (*) 3.5 - 5.1 mEq/L   Chloride 104  96 - 112 mEq/L   CO2 22  19 - 32 mEq/L   Glucose, Bld 91  70 - 99 mg/dL   BUN 11  6 - 23 mg/dL   Creatinine, Ser 0.27  0.50 - 1.10 mg/dL   Calcium 25.3 (*) 8.4 - 10.5 mg/dL   GFR calc non Af Amer >90  >  90 mL/min   GFR calc Af Amer >90  >90 mL/min   Comment:            The eGFR has been calculated     using the CKD EPI equation.     This calculation has not been     validated in all clinical     situations.     eGFR's persistently     <90 mL/min signify     possible Chronic Kidney Disease.  TYPE AND SCREEN     Status: None   Collection Time    10/10/12  1:50 PM      Result Value Range   ABO/RH(D) B POS     Antibody Screen NEG     Sample Expiration 10/13/2012      Dg Wrist Complete Right  10/10/2012  *RADIOLOGY REPORT*  Clinical Data: Fall, wrist pain and discomfort, broke fall with wrist  RIGHT WRIST - COMPLETE 3+ VIEW  Comparison: None.  Findings: Osseous mineralization normal. Joint spaces preserved. Minimally irregular density within the proximal lunate question lunatomalacia. On the lateral view, an irregular bony density is seen dorsal to the triquetrum question triquetral fracture. Mild soft tissue swelling dorsum of wrist. Nondisplaced radial styloid fracture noted. No additional fracture or dislocation identified.  IMPRESSION: Nondisplaced radial styloid fracture. Question fracture at dorsal margin of triquetrum.  Findings called to Dr. Tyson Alias on 03/6/32014 at 1346 hrs.   Original Report Authenticated By: Ulyses Southward, M.D.    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  Multiplanar CT image reconstructions of the cervical spine were also generated.  Comparison:  None Correlation:  MR brain 08/22/2038  CT HEAD  Findings: Diffuse prominence of the ventricular system similar to prior MRI question normal pressure hydrocephalus. Brain parenchyma otherwise normal appearance. No midline shift or mass effect. No intracranial hemorrhage, mass lesion or evidence of acute infarction. No definite extra-axial fluid collections. Skull intact. Visualized paranasal sinuses and mastoid air cells clear.  IMPRESSION: No acute intracranial abnormalities. Diffuse dilatation of the ventricular system similar to prior MRI question normal pressure hydrocephalus.  CT CERVICAL SPINE  Findings: Visualized skull base intact. Congenital C2-C3 fusion. Incomplete posterior C1, normal variant. Vertebral body and disc space heights otherwise maintained. Prevertebral soft tissues normal thickness. No acute fracture, subluxation, or bone destruction. Lung apices clear. Gaseous distention of the proximal thoracic esophagus. Marked enlargement of left thyroid lobe, extending beyond imaged field, measuring at least 5.9 cm transverse and greater than 4.3 cm AP and containing multiple nodules, several of which are calcified. Displacement of the trachea and esophagus left to right. Low attenuation nodule question complicated cystic lesion at the left thyroid lobe measures 2.6 x 2.3 cm. Numerous scattered normal-sized cervical lymph nodes.  IMPRESSION: No acute cervical spine abnormalities.  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 Gaseous distention of the proximal thoracic  esophagus question related to known diagnosis of CREST syndrome.  Marked multilateral enlargement of the left thyroid lobe; at least portions of this are present on a prior ultrasound of 04/09/2010. Prior ultrasound demonstrated a 6 cm left lobe nodule but unable to demonstrate stability by incomplete CT imaging today; unless the dominant masses have  been previously biopsied, recommend follow-up non emergent ultrasound imaging to evaluate.   Original Report Authenticated By: Ulyses Southward, M.D.    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  Multiplanar CT image reconstructions of the cervical spine were also generated.  Comparison:  None Correlation:  MR brain 08/22/2038  CT HEAD  Findings: Diffuse prominence of the ventricular system similar to prior MRI question normal pressure hydrocephalus. Brain parenchyma otherwise normal appearance. No midline shift or mass effect. No intracranial hemorrhage, mass lesion or evidence of acute infarction. No definite extra-axial fluid collections. Skull intact. Visualized paranasal sinuses and mastoid air cells clear.  IMPRESSION: No acute intracranial abnormalities. Diffuse dilatation of the ventricular system similar to prior MRI question normal pressure hydrocephalus.  CT CERVICAL SPINE  Findings: Visualized skull base intact. Congenital C2-C3 fusion. Incomplete posterior C1, normal variant. Vertebral body and disc space heights otherwise maintained. Prevertebral soft tissues normal thickness. No acute fracture, subluxation, or bone destruction. Lung apices clear. Gaseous distention of the proximal thoracic esophagus. Marked  enlargement of left thyroid lobe, extending beyond imaged field, measuring at least 5.9 cm transverse and greater than 4.3 cm AP and containing multiple nodules, several of which are calcified. Displacement of the trachea and esophagus left to right. Low attenuation nodule question complicated cystic lesion at the left thyroid lobe measures 2.6 x 2.3 cm. Numerous scattered normal-sized cervical lymph nodes.  IMPRESSION: No acute cervical spine abnormalities.  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 Gaseous distention of the proximal thoracic esophagus question related to known diagnosis of CREST syndrome.  Marked multilateral enlargement of the left thyroid lobe; at least portions of this are present on a prior ultrasound of 04/09/2010. Prior ultrasound demonstrated a 6 cm left lobe nodule but unable to demonstrate stability by incomplete CT imaging today; unless the dominant masses have been previously biopsied, recommend follow-up non emergent ultrasound imaging to evaluate.   Original Report Authenticated By: Ulyses Southward, M.D.      A comprehensive review of systems was negative except for: Hematologic/lymphatic: positive for bleeding and easy bruising  Blood pressure 119/77, pulse 77, temperature 98 F (36.7 C), temperature source Oral, resp. rate 14, SpO2 100.00%.  General appearance: alert, cooperative and appears stated age Head: Normocephalic, without obvious abnormality, laceration over right eye Neck: supple, symmetrical, trachea midline Extremities: light touch sensation and capillary refill intact all digits.  +epl/fpl/io.  no wounds either upper extremity.  ttp dorsally at triquetrum.  minimal radial styloid tenderness.  no tenderness in digits, hand, forearm, elbow.  compartments soft.  no eccymosis. Pulses: 2+ and symmetric Skin: Skin color, texture, turgor normal. No rashes or lesions Neurologic: Grossly normal Incision/Wound: na  Assessment/Plan Right wrist triquetral  avulsion fracture and radial styloid fracture.  Discussed nature of injury with patient and husband.  Will get CT for evaluation of styloid fracture.  Sugar tong splint.  Elevation.  Follow up in office next week.  They agree with plan of care.  KUZMA,KEVIN R 10/10/2012, 2:39 PM

## 2012-10-10 NOTE — ED Notes (Signed)
Patient arrived from CT with 2100 staff and rapid response. Patient was on spine board, c-collar and head blocks in place. Patient slipped on wet floor in unit and hit her head. No LOC.She tried to stop her fall by using her right arm and has swelling and pain in the right wrist. Patient states she is on coumadin for clotting disorder. Patient is very nervious and states this is her fear of having a head bleed. Husband is at bedside.

## 2012-10-10 NOTE — Progress Notes (Signed)
Orthopedic Tech Progress Note Patient Details:  [name] 1956-03-29 [id number]  Ortho Devices Type of Ortho Device: Ace wrap;Sugartong splint Ortho Device/Splint Location: right arm Ortho Device/Splint Interventions: Application   Crawford, Rembert 10/10/2012, 3:54 PM

## 2012-10-10 NOTE — Consult Note (Signed)
BP 113/64  Pulse 72  Temp(Src) 98 F (36.7 C) (Oral)  Resp 20  SpO2 97% [name] is a 57 y.o. female Whom slipped and fell striking her head in the 2100 unit of [facility]. She did not lose consciousness, and was coherent at the time of the fall. She was moving all extremities. The fall was witnessed by nurses and physicians. She was sent quickly to the ER for a Head CT and Cervical spine CT. I was called as a precautionary move.  Allergies  Allergen Reactions  . Codeine Nausea And Vomiting  . Demerol   . Ultram (Tramadol Hcl) Other (See Comments)    Makes her hands shake.   Prior to Admission medications   Medication Sig Start Date End Date Taking? Authorizing Provider  aspirin 325 MG tablet Take 325 mg by mouth daily.   Yes Historical Provider, MD  esomeprazole (NEXIUM) 40 MG capsule Take 40 mg by mouth daily before breakfast.   Yes Historical Provider, MD  gabapentin (NEURONTIN) 100 MG capsule Take 100 mg by mouth 3 (three) times daily.   Yes Historical Provider, MD  losartan (COZAAR) 50 MG tablet Take 50 mg by mouth daily.   Yes Historical Provider, MD  NIFEdipine (PROCARDIA-XL/ADALAT CC) 30 MG 24 hr tablet Take 30 mg by mouth daily.   Yes Historical Provider, MD  progesterone (PROMETRIUM) 100 MG capsule Take 100 mg by mouth daily.   Yes Historical Provider, MD  rifaximin (XIFAXAN) 550 MG TABS Take 550 mg by mouth daily.   Yes Historical Provider, MD  warfarin (COUMADIN) 5 MG tablet Take 5 mg by mouth as directed. Patient uses 7.5 mg of the medication, Mon-Sat.  Patient uses 5 mg of the medication on Sunday.   Yes Historical Provider, MD  metoprolol tartrate (LOPRESSOR) 25 MG tablet May take up to 4 times a day as needed for palps 09/18/11   [name], MD   Past Surgical History  Procedure Laterality Date  . Splenectomy      ITP  . Uterine ablation     Past Medical History  Diagnosis Date  . DVT (deep venous thrombosis)   . CREST syndrome   . Pericardial  effusion   . MVP (mitral valve prolapse)   . Antiphospholipid antibody syndrome   . ITP (idiopathic thrombocytopenic purpura)    Family History  Problem Relation Age of Onset  . Cancer Mother     Lung  . Diabetes Father   . COPD Father   . Hyperlipidemia Father   . Hypertension Father    History   Social History  . Marital Status: Married    Spouse Name: N/A    Number of Children: 2  . Years of Education: N/A   Occupational History  .     Social History Main Topics  . Smoking status: Never Smoker   . Smokeless tobacco: Never Used  . Alcohol Use: Yes  . Drug Use: No  . Sexually Active: Not on file   Other Topics Concern  . Not on file   Social History Narrative   Two children and 3 step children and five grandchildren.  Nurse in the ICU   Physical Exam  Constitutional: She is oriented to person, place, and time. She appears well-developed and well-nourished. No distress.  HENT:  Laceration right forehead  Eyes: Conjunctivae and EOM are normal. Pupils are equal, round, and reactive to light.  Neck: Normal range of motion. Neck supple.  Cardiovascular: Normal rate,  regular rhythm, normal heart sounds and intact distal pulses.   Pulmonary/Chest: Effort normal and breath sounds normal.  Neurological: She is alert and oriented to person, place, and time. No cranial nerve deficit. She exhibits normal muscle tone. Coordination normal.  Skin: Skin is warm and dry. She is not diaphoretic.  Psychiatric: She has a normal mood and affect. Her behavior is normal. Judgment and thought content normal.  GCS15 Results for orders placed during the hospital encounter of 10/10/12 (from the past 24 hour(s))  APTT     Status: Abnormal   Collection Time    10/10/12 12:58 PM      Result Value Range   aPTT 41 (*) 24 - 37 seconds  PROTIME-INR     Status: Abnormal   Collection Time    10/10/12 12:58 PM      Result Value Range   Prothrombin Time 19.4 (*) 11.6 - 15.2 seconds   INR 1.70  (*) 0.00 - 1.49  CBC     Status: Abnormal   Collection Time    10/10/12  1:40 PM      Result Value Range   WBC 8.5  4.0 - 10.5 K/uL   RBC 4.56  3.87 - 5.11 MIL/uL   Hemoglobin 13.6  12.0 - 15.0 g/dL   HCT 96.0  45.4 - 09.8 %   MCV 86.6  78.0 - 100.0 fL   MCH 29.8  26.0 - 34.0 pg   MCHC 34.4  30.0 - 36.0 g/dL   RDW 11.9  14.7 - 82.9 %   Platelets 441 (*) 150 - 400 K/uL  BASIC METABOLIC PANEL     Status: Abnormal   Collection Time    10/10/12  1:40 PM      Result Value Range   Sodium 139  135 - 145 mEq/L   Potassium 3.4 (*) 3.5 - 5.1 mEq/L   Chloride 104  96 - 112 mEq/L   CO2 22  19 - 32 mEq/L   Glucose, Bld 91  70 - 99 mg/dL   BUN 11  6 - 23 mg/dL   Creatinine, Ser 5.62  0.50 - 1.10 mg/dL   Calcium 13.0 (*) 8.4 - 10.5 mg/dL   GFR calc non Af Amer >90  >90 mL/min   GFR calc Af Amer >90  >90 mL/min  TYPE AND SCREEN     Status: None   Collection Time    10/10/12  1:50 PM      Result Value Range   ABO/RH(D) B POS     Antibody Screen NEG     Sample Expiration 10/13/2012    ABO/RH     Status: None   Collection Time    10/10/12  1:50 PM      Result Value Range   ABO/RH(D) B POS     Head CT is normal. No edh, sdh, sah. CSpine CT no fractures, malalignment  Assessment [name] has a normal neurologic examination. Since she does take coumadin I would not give her any more for the next 72 hours. Prior to restarting the coumadin I would obtain another CT.  She otherwise looks good. Will follow.

## 2012-10-11 LAB — CBC
HCT: 38.6 % (ref 36.0–46.0)
Hemoglobin: 13.2 g/dL (ref 12.0–15.0)
MCV: 90.2 fL (ref 78.0–100.0)
RBC: 4.28 MIL/uL (ref 3.87–5.11)
WBC: 6.8 10*3/uL (ref 4.0–10.5)

## 2012-10-11 LAB — BASIC METABOLIC PANEL
BUN: 12 mg/dL (ref 6–23)
CO2: 26 mEq/L (ref 19–32)
Chloride: 106 mEq/L (ref 96–112)
Creatinine, Ser: 0.74 mg/dL (ref 0.50–1.10)
Potassium: 3.7 mEq/L (ref 3.5–5.1)

## 2012-10-11 MED ORDER — NEOMYCIN-POLYMYXIN-PRAMOXINE 1 % EX CREA: TOPICAL_CREAM | Freq: Two times a day (BID) | CUTANEOUS | Status: DC

## 2012-10-11 MED ORDER — HYDROCODONE-ACETAMINOPHEN 5-325 MG PO TABS: 1.0000 | ORAL_TABLET | Freq: Four times a day (QID) | ORAL | Status: DC | PRN

## 2012-10-11 MED ORDER — MUPIROCIN 2 % EX OINT: 1.0000 "application " | TOPICAL_OINTMENT | Freq: Two times a day (BID) | CUTANEOUS | Status: AC

## 2012-10-11 NOTE — ED Provider Notes (Signed)
Medical screening examination/treatment/procedure(s) were conducted as a shared visit with non-physician practitioner(s) and myself.  I personally evaluated the patient during the encounter  Please see my separate respective documentation pertaining to this patient encounter   Vida Roller, MD 10/11/12 [id number]

## 2012-10-11 NOTE — Discharge Summary (Signed)
Physician Discharge Summary  Patient ID: [name] MRN: [id number] DOB/AGE: [birth year] 57 y.o.  Admit date: 10/10/2012 Discharge date: 10/11/2012    Discharge Diagnoses:  Principal Problem:   Fall Active Problems:   HTN (hypertension)   Atrial fibrillation   Antiphospholipid antibody positive   Chronic anticoagulation    Brief Summary: [name] is a 57 y.o. 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 No LOC, or focal neuro deficits post fall. No loss of bowel / bladder.  Post fall complained of pain in R wrist and face related to laceration.  Admitted for evaluation post fall / monitoring of mental status, concern for head bleed in setting of coumadin therapy / fall.   R eye laceration sutured per ER.  CT of Head / Neck obtained and were negative for acute bleed. Dr. Franky Macho  evaluated patient in ER with noted normal neurological evaluation.  Recommendations to hold coumadin for 3 days (see instructions below).  R wrist films obtained which noted nondisplaced radial styloid fracture. Chip fracture off the dorsal aspect of the triquetrum.  Orthopedic consult obtained and patient was evaluated by Dr. Merlyn Lot.  Patient's R wrist / arm was placed in a sugar tong splint.  CT of wrist also obtained with results below to evaluate styloid fracture.  Later on the evening of admit, she noted pain with ambulation and R hip / Knee films were obtained and also negative.    CONSULTS Dr. Franky Macho  Dr. Betha Loa  MICRO DATA  3/6 MRSA PCR>>>Positive   SIGNIFICANT DIAGNOSTIC STUDIES  3/6 CT HEAD / NECK>>>No acute intracranial abnormalities. Diffuse dilatation of the ventricular system similar to prior MRI question normal pressure hydrocephalus. 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 Gaseous distention of the proximal thoracic esophagus question related to known diagnosis of CREST syndrome. Marked multilateral enlargement of the left thyroid lobe; at least portions of this are present on a prior ultrasound of 04/09/2010. Prior ultrasound demonstrated a 6 cm left lobe nodule but unable to demonstrate stability by incomplete CT imaging 3/6 (previously biopsied / worked up per patient)  3/6 CT R Wrist>>>Nondisplaced radial styloid fracture. Chip fracture off the dorsal aspect of the triquetrum.  Small cysts and sclerosis in the lunate eccentric on the ulnar side of the lunate suggestive of ulnocarpal abutment.  3/6 R HIP Xray>>>Negative for fracture. Normal hip joint on the right. Negative for AVN. Negative for pelvic fracture.  3/6 R Knee Xray>>>Normal alignment and no fracture. No joint effusion. Mild degenerative change in the patella. Small calcification anterior to the proximal tibia most likely vascular.                                                                                               Discharge Plan by Diagnosis   Status Post Fall - fall 3/6 while at work, striking head on floor with laceration above R eye. CT head / neck  negative. Negative on exam to palpation. -cervical spine cleared via CT / clinical exam.  On day of discharge, discussed plan of care with Dr. Franky Macho who agrees in setting of normal neurological evaluation, that she does not need repeat CT imaging of head unless she were to have a change in status.  No outpatient neurosurgical evaluation required at time of discharge.  Right Eye Laceration - sutured with 3 stitches in ER.    Discharge Plan:  -No need for further CT imaging of head unless acute neuro change  -facial laceration care with neosporin to site, keep clean / dry, follow up with Dr. Selena Batten for suture removal -NS recs no coumadin / ASA x 72 hrs, then can resume on 3/10  -No further neurosurgical follow up per Dr. Franky Macho unless neuro  status change    Nondisplaced radial styloid fracture  Questionable fracture at dorsal margin of triquetrum   Discharge Plan:  -to follow up with Dr. Betha Loa 3/7 afternoon   -PRN Vicodin for pain  -sugar tong splint     HTN  Antiphospholipid Antibody Positive  Scleroderma / CREST Syndrome  Hx of DVT x2- on chronic coumadin   Discharge Plan:  -continue home procardia, cozaar  -hold coumadin as above    GERD  Discharge Plan:  -continue home nexium, xifaxan    Chronic Anti-coagulation  Patient on chronic coumadin therapy with history of DVT x2, Positive anti-phospholipid antibody.  Last coumadin dose on 3/6.  INR 1.7 on 3/6 and 3/7.  Recommended to hold coumadin for 3 days (until 3/10).  She should follow up with Dr. Selena Batten on 3/10.  Unable to make follow up appt for her as office was closed due to inclement weather.    Discharge Plan:  -resume coumadin 3/10 -follow up with Dr. Selena Batten for INR -patient had a planned car trip to [place] for funeral, recommended she not travel long period of time given no anti-coagulation   MRSA Positive PCR  Discharge Plan: -bacitracin BID x 5 days   Discharge Exam: General: wdwn adult female in NAD  Neuro: AAOx4, pupils =R, no focal deficits, MAE  HEENT: Mm pink/moist, no neck tenderness to palpation  Cardiovascular: s1s2 rrr, no m/r/g  Lungs: resp's even/non-labored, lungs bilaterally clear  Abdomen: Round/soft, bsx4 active  Musculoskeletal: No acute deformities, R arm in cast, sensation intact, good cap refill Skin: Warm/dry, no edema   Filed Vitals:   10/11/12 0430 10/11/12 0500 10/11/12 0841 10/11/12 0955  BP:  101/52  110/57  Pulse:  61    Temp: 97.8 F (36.6 C)  98.6 F (37 C)   TempSrc: Oral  Oral   Resp:  10    SpO2:  96%       Discharge Labs  BMET  Recent Labs Lab 10/10/12 1340 10/11/12 0533  NA 139 141  K 3.4* 3.7  CL 104 106  CO2 22 26  GLUCOSE 91 105*  BUN 11 12  CREATININE 0.69 0.74  CALCIUM 10.7*  9.7   CBC  Recent Labs Lab 10/10/12 1340 10/11/12 0533  HGB 13.6 13.2  HCT 39.5 38.6  WBC 8.5 6.8  PLT 441* 431*   Anti-Coagulation  Recent Labs Lab 10/10/12 1258 10/11/12 0533  INR 1.70* 1.70*        Follow-up Information   Follow up with Tami Ribas, MD On 10/11/2012. (Appt at 2 PM )    Contact information:   [address] [place] [address] [phone]  Call Pearson Grippe, MD. (Call for appt to be seen Monday 3/10)    Contact information:   [address] [place] [address] [phone]         Medication List    STOP taking these medications       aspirin 325 MG tablet     warfarin 5 MG tablet  Commonly known as:  COUMADIN      TAKE these medications       esomeprazole 40 MG capsule  Commonly known as:  NEXIUM  Take 40 mg by mouth daily before breakfast.     gabapentin 100 MG capsule  Commonly known as:  NEURONTIN  Take 100 mg by mouth 3 (three) times daily.     HYDROcodone-acetaminophen 5-325 MG per tablet  Commonly known as:  NORCO/VICODIN  Take 1-2 tablets by mouth every 6 (six) hours as needed.     losartan 50 MG tablet  Commonly known as:  COZAAR  Take 50 mg by mouth daily.     metoprolol tartrate 25 MG tablet  Commonly known as:  LOPRESSOR  May take up to 4 times a day as needed for palps     mupirocin ointment 2 %  Commonly known as:  BACTROBAN  Apply 1 application topically 2 (two) times daily.     neomycin-polymyxin-pramoxine 1 % cream  Commonly known as:  NEOSPORIN PLUS  Apply topically 2 (two) times daily. Apply to R eye laceration     NIFEdipine 30 MG 24 hr tablet  Commonly known as:  PROCARDIA-XL/ADALAT CC  Take 30 mg by mouth daily.     progesterone 100 MG capsule  Commonly known as:  PROMETRIUM  Take 100 mg by mouth daily.     rifaximin 550 MG Tabs  Commonly known as:  XIFAXAN  Take 550 mg by mouth daily.        Discharge Orders   Future Orders Complete By Expires     Call  MD for:  difficulty breathing, headache or visual disturbances  As directed     Call MD for:  persistant dizziness or light-headedness  As directed     Call MD for:  severe uncontrolled pain  As directed     Call MD for:  As directed     Scheduling Instructions:      Call for change in mental status, persistent headache, nausea / vomiting    Diet general  As directed     Discharge instructions  As directed     Comments:      May restart Coumadin / Aspirin on 3/10 - would like for you to see Dr. Selena Batten on 3/10 as well.   Ambulate frequently (Q2 hours while awake).  Pump your feet while riding / sitting for long periods. Bacitracin intranasally BID for 5 days.  If any changes in mental status, report to ER immediately. Follow up with Dr. Selena Batten to have sutures reviewed  Keep R eye laceration clean / dry.  May cover with neosporin / keep area moist.   Follow up with Dr. Merlyn Lot for wrist fracture.    Increase activity slowly  As directed         Disposition:  Home in stable condition.  Normal neurological status prior to discharge.     Discharged Condition: Namita Yearwood has met maximum benefit of inpatient care and is medically stable and cleared for discharge.  Patient is pending follow up as above.      Time spent  on disposition:  Greater than 35 minutes.   Signed: Canary Brim, NP-C [facility] Pgr: [phone]    Per NS, avoiding coumadin till Monday Will follow up primary Would avoid travel this weekend off coumadin and risk hypercoagulability Ortho follow up made .Mcarthur Rossetti. Tyson Alias, MD, FACP Pgr: [phone] [facility]

## 2012-11-07 ENCOUNTER — Encounter (HOSPITAL_COMMUNITY): Payer: Self-pay

## 2012-11-07 ENCOUNTER — Emergency Department (HOSPITAL_COMMUNITY): Payer: 59

## 2012-11-07 ENCOUNTER — Inpatient Hospital Stay (HOSPITAL_COMMUNITY)
Admission: EM | Admit: 2012-11-07 | Discharge: 2012-11-14 | DRG: 238 | Disposition: A | Discharge status: Home / Self Care | Payer: 59 | Attending: Internal Medicine | Admitting: Internal Medicine

## 2012-11-07 DIAGNOSIS — I314 Cardiac tamponade: Secondary | ICD-10-CM | POA: Diagnosis present

## 2012-11-07 DIAGNOSIS — Y990 Civilian activity done for income or pay: Secondary | ICD-10-CM

## 2012-11-07 DIAGNOSIS — I10 Essential (primary) hypertension: Secondary | ICD-10-CM | POA: Diagnosis present

## 2012-11-07 DIAGNOSIS — D6859 Other primary thrombophilia: Secondary | ICD-10-CM | POA: Diagnosis present

## 2012-11-07 DIAGNOSIS — I312 Hemopericardium, not elsewhere classified: Principal | ICD-10-CM | POA: Diagnosis present

## 2012-11-07 DIAGNOSIS — R296 Repeated falls: Secondary | ICD-10-CM | POA: Diagnosis present

## 2012-11-07 DIAGNOSIS — E049 Nontoxic goiter, unspecified: Secondary | ICD-10-CM

## 2012-11-07 DIAGNOSIS — I4891 Unspecified atrial fibrillation: Secondary | ICD-10-CM | POA: Diagnosis present

## 2012-11-07 DIAGNOSIS — Z86718 Personal history of other venous thrombosis and embolism: Secondary | ICD-10-CM

## 2012-11-07 DIAGNOSIS — Z7901 Long term (current) use of anticoagulants: Secondary | ICD-10-CM

## 2012-11-07 DIAGNOSIS — I480 Paroxysmal atrial fibrillation: Secondary | ICD-10-CM

## 2012-11-07 DIAGNOSIS — I509 Heart failure, unspecified: Secondary | ICD-10-CM | POA: Diagnosis not present

## 2012-11-07 DIAGNOSIS — I7300 Raynaud's syndrome without gangrene: Secondary | ICD-10-CM | POA: Diagnosis present

## 2012-11-07 DIAGNOSIS — D693 Immune thrombocytopenic purpura: Secondary | ICD-10-CM | POA: Diagnosis present

## 2012-11-07 DIAGNOSIS — R760 Raised antibody titer: Secondary | ICD-10-CM | POA: Diagnosis present

## 2012-11-07 DIAGNOSIS — R0600 Dyspnea, unspecified: Secondary | ICD-10-CM | POA: Diagnosis present

## 2012-11-07 DIAGNOSIS — M349 Systemic sclerosis, unspecified: Secondary | ICD-10-CM | POA: Diagnosis present

## 2012-11-07 DIAGNOSIS — I319 Disease of pericardium, unspecified: Secondary | ICD-10-CM

## 2012-11-07 DIAGNOSIS — I059 Rheumatic mitral valve disease, unspecified: Secondary | ICD-10-CM | POA: Diagnosis present

## 2012-11-07 DIAGNOSIS — R918 Other nonspecific abnormal finding of lung field: Secondary | ICD-10-CM

## 2012-11-07 DIAGNOSIS — I313 Pericardial effusion (noninflammatory): Secondary | ICD-10-CM

## 2012-11-07 DIAGNOSIS — J90 Pleural effusion, not elsewhere classified: Secondary | ICD-10-CM

## 2012-11-07 DIAGNOSIS — M341 CR(E)ST syndrome: Secondary | ICD-10-CM | POA: Diagnosis present

## 2012-11-07 HISTORY — DX: Raynaud's syndrome without gangrene: I73.00

## 2012-11-07 HISTORY — DX: Hemopericardium, not elsewhere classified: I31.2

## 2012-11-07 HISTORY — DX: Nontoxic goiter, unspecified: E04.9

## 2012-11-07 HISTORY — DX: Paroxysmal atrial fibrillation: I48.0

## 2012-11-07 HISTORY — DX: Essential (primary) hypertension: I10

## 2012-11-07 LAB — PROTIME-INR
INR: 3.83 — ABNORMAL HIGH (ref 0.00–1.49)
Prothrombin Time: 35.4 seconds — ABNORMAL HIGH (ref 11.6–15.2)

## 2012-11-07 LAB — BASIC METABOLIC PANEL
BUN: 13 mg/dL (ref 6–23)
CO2: 21 mEq/L (ref 19–32)
Chloride: 102 mEq/L (ref 96–112)
GFR calc non Af Amer: >90 mL/min (ref >90)
Glucose, Bld: 96 mg/dL (ref 70–99)
Potassium: 3.6 mEq/L (ref 3.5–5.1)
Sodium: 135 mEq/L (ref 135–145)

## 2012-11-07 LAB — CBC
HCT: 31.9 % — ABNORMAL LOW (ref 36.0–46.0)
Hemoglobin: 10.7 g/dL — ABNORMAL LOW (ref 12.0–15.0)
RBC: 3.64 MIL/uL — ABNORMAL LOW (ref 3.87–5.11)
WBC: 9.8 10*3/uL (ref 4.0–10.5)

## 2012-11-07 LAB — POCT I-STAT TROPONIN I: Troponin i, poc: 0.02 ng/mL (ref 0.00–0.08)

## 2012-11-07 MED ORDER — IOHEXOL 350 MG/ML SOLN
100.0000 mL | Freq: Once | INTRAVENOUS | Status: AC | PRN
  Administered 2012-11-07: 100 mL via INTRAVENOUS

## 2012-11-07 NOTE — H&P (Signed)
[name] is an 57 y.o. female.   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 [name] actually had a fall at work 2 weeks ago slipping on an object on the floor. Her INR was briefly subtherapeutic but she is 3.8 here today. She has not noticed any oozing or bleeding. She was saturating in the 80s on arrival and she responded to 4L leading to the ER team to pursue CTA to look for PE. No PE found but a large pericardial loculated effusion was noted on CT compressing the right ventricle. She has been stable in the ER with good bp and hr and comfortable state while lying in bed.   Past Medical History  Diagnosis Date  . DVT (deep venous thrombosis)   . CREST syndrome   . Pericardial effusion   . MVP (mitral valve prolapse)   . Antiphospholipid antibody syndrome   . ITP (idiopathic thrombocytopenic purpura)   . Raynaud's phenomenon   . Hypertension     Past Surgical History  Procedure Laterality Date  . Splenectomy      ITP  . Uterine ablation    . Tubal ligation      Family History  Problem Relation Age of Onset  . Cancer Mother     Lung  . Diabetes Father   . COPD Father   . Hyperlipidemia Father   . Hypertension Father    Social History:  reports that she has never smoked. She has never used smokeless tobacco. She reports that she does not drink alcohol or use illicit drugs.  Allergies:  Allergies  Allergen Reactions  . Codeine Nausea And Vomiting  . Demerol   . Ultram (Tramadol Hcl) Other (See Comments)    Makes her hands shake.   Medications reviewed with patient  (Not in a hospital admission)  Results for orders placed during the hospital  encounter of 11/07/12 (from the past 48 hour(s))  CBC     Status: Abnormal   Collection Time    11/07/12  6:28 PM      Result Value Range   WBC 9.8  4.0 - 10.5 K/uL   RBC 3.64 (*) 3.87 - 5.11 MIL/uL   Hemoglobin 10.7 (*) 12.0 - 15.0 g/dL   HCT 16.1 (*) 09.6 - 04.5 %   MCV 87.6  78.0 - 100.0 fL   MCH 29.4  26.0 - 34.0 pg   MCHC 33.5  30.0 - 36.0 g/dL   RDW 40.9  81.1 - 91.4 %   Platelets 388  150 - 400 K/uL  BASIC METABOLIC PANEL     Status: None   Collection Time    11/07/12  6:28 PM      Result Value Range   Sodium 135  135 - 145 mEq/L   Potassium 3.6  3.5 - 5.1 mEq/L   Chloride 102  96 - 112 mEq/L   CO2 21  19 - 32 mEq/L   Glucose, Bld 96  70 - 99 mg/dL   BUN 13  6 - 23 mg/dL   Creatinine, Ser 7.82  0.50 - 1.10 mg/dL   Calcium 9.8  8.4 - 95.6 mg/dL   GFR calc non Af Amer >90  >90  mL/min   GFR calc Af Amer >90  >90 mL/min   Comment:            The eGFR has been calculated     using the CKD EPI equation.     This calculation has not been     validated in all clinical     situations.     eGFR's persistently     <90 mL/min signify     possible Chronic Kidney Disease.  PROTIME-INR     Status: Abnormal   Collection Time    11/07/12  6:28 PM      Result Value Range   Prothrombin Time 35.4 (*) 11.6 - 15.2 seconds   INR 3.83 (*) 0.00 - 1.49  PRO B NATRIURETIC PEPTIDE     Status: Abnormal   Collection Time    11/07/12  6:32 PM      Result Value Range   Pro B Natriuretic peptide (BNP) 557.6 (*) 0 - 125 pg/mL  POCT I-STAT TROPONIN I     Status: None   Collection Time    11/07/12  6:46 PM      Result Value Range   Troponin i, poc 0.02  0.00 - 0.08 ng/mL   Comment 3            Comment: Due to the release kinetics of cTnI,     a negative result within the first hours     of the onset of symptoms does not rule out     myocardial infarction with certainty.     If myocardial infarction is still suspected,     repeat the test at appropriate intervals.   Dg Chest 2  View  11/07/2012  *RADIOLOGY REPORT*  Clinical Data: Short of breath  CHEST - 2 VIEW  Comparison: 09/03/2012  Findings: Borderline cardiomegaly.  Small pleural effusions.  Mild vascular congestion.  Subsegmental atelectasis at the left base. No pneumothorax.  IMPRESSION: Borderline cardiomegaly with vascular congestion and small pleural effusions.   Original Report Authenticated By: Jolaine Click, M.D.    Ct Angio Chest Pe W/cm &/or Wo Cm  11/07/2012  *RADIOLOGY REPORT*  Clinical Data: Severe shortness of breath with exertion.  Recent wrist and clavicular fractures.  CT ANGIOGRAPHY CHEST  Technique:  Multidetector CT imaging of the chest using the standard protocol during bolus administration of intravenous contrast. Multiplanar reconstructed images including MIPs were obtained and reviewed to evaluate the vascular anatomy.  Contrast: OMNIPAQUE IOHEXOL 350 MG/ML SOLN  Comparison: 08/30/2010  Findings: Heterogeneous enlargement of the left lobe of the thyroid extending into the substernal space and displacing the trachea towards the right.  Mass measures about 4.3 x 5.1 cm.  There is some calcification.  The appearance is stable since previous study.  Technically adequate study with good opacification of the central and segmental pulmonary arteries.  No focal filling defects demonstrated.  No evidence of significant pulmonary embolus.  There is interval development of a large pericardial effusion which appears loculated towards the right anteriorly. Density measurements are increased, suggesting that this might be hemorrhagic or infected.  The effusion causes constriction of the right ventricle.  Correlate for clinical signs of cardiac tamponade.  There is reflux of contrast material into the inferior vena cava.  New bilateral pleural effusions, greater on the right, with atelectasis or consolidation in the lung bases.  The esophagus is distended and filled with fluid, gas, and heterogeneous material consistent  with food.  This may represent esophageal  dysmotility or achalasia.  Scattered lymph nodes in the mediastinum and hilar regions are not pathologically enlarged.  Airways appear patent. No pneumothorax.  Mild degenerative changes of the thoracic spine.  IMPRESSION: No evidence of significant pulmonary embolus.  Large pericardial effusion appears loculated in the right anteriorly and causing constriction of the right ventricle.  Bilateral pleural effusions, greater on the right, with atelectasis in both lung bases. Esophageal dilatation suggesting achalasia or dysmotility.  Stable large left thyroid goiter.  Results were discussed by telephone with Dr. Bernette Mayers at 2127 hours on 11/07/2012.   Original Report Authenticated By: Burman Nieves, M.D.     Review of Systems  Constitutional: Positive for malaise/fatigue. Negative for fever, chills and weight loss.  HENT: Negative for hearing loss.   Eyes: Negative for double vision, photophobia and pain.  Respiratory: Positive for shortness of breath. Negative for cough and hemoptysis.   Cardiovascular: Positive for leg swelling. Negative for chest pain and palpitations.  Gastrointestinal: Positive for heartburn. Negative for nausea, vomiting and abdominal pain.  Genitourinary: Negative for dysuria, urgency and frequency.  Musculoskeletal: Positive for joint pain and falls. Negative for myalgias.  Skin: Negative for itching and rash.  Neurological: Negative for dizziness, tingling, tremors and headaches.  Endo/Heme/Allergies: Negative for environmental allergies and polydipsia. Bruises/bleeds easily.  Psychiatric/Behavioral: Negative for depression, suicidal ideas and substance abuse.    Blood pressure 132/93, pulse 124, temperature 98 F (36.7 C), temperature source Oral, resp. rate 16, SpO2 86.00%. Physical Exam  Nursing note and vitals reviewed. Constitutional: She is oriented to person, place, and time. She appears well-developed and well-nourished.  No distress.  Mildly anxious  HENT:  Head: Normocephalic and atraumatic.  Nose: Nose normal.  Mouth/Throat: Oropharynx is clear and moist. No oropharyngeal exudate.  Eyes: Conjunctivae and EOM are normal. Pupils are equal, round, and reactive to light. No scleral icterus.  Neck: Normal range of motion. Neck supple. No JVD present. Tracheal deviation present.  JVP 2 cm above clavicle  Cardiovascular: Normal rate, regular rhythm, normal heart sounds and intact distal pulses.  Exam reveals no gallop.   No murmur heard. Respiratory: Effort normal. No respiratory distress. She has no wheezes. She has rales.  GI: Soft. Bowel sounds are normal. She exhibits no distension. There is no tenderness. There is no rebound.  Musculoskeletal: Normal range of motion. She exhibits edema. She exhibits no tenderness.  Neurological: She is alert and oriented to person, place, and time. No cranial nerve deficit. Coordination normal.  Skin: Skin is warm and dry. No rash noted. She is not diaphoretic. No erythema.  Psychiatric: She has a normal mood and affect. Her behavior is normal.  Labs reviewed; INR 3.7, K 3.6, Cr 0.64, bnp 558, troponin 0.02 ECG reviewed; sinus tachycardia CTA reviewed: large thyroid mass, stable; large right anterior pericardial effusion leading to constriction of RV  Problem List  Pericardial effusion - large, ? Hemorrhagic  Scleroderma/CREST Antiphospholipid Antibody Syndrome ITP s/p splenectomy in past with resolution of thrombocytopenia Prior DVTs Atrial fibrillation Hypertension Chronic Anticoagulation Stable enlarged thyroid - biopsied previously and negative  Assessment/Plan 57 yo woman with above PMH now with large loculated pericardial effusion. Loculated effusions are not as amenable to pericardiocentesis. Differential diagnosis is hemorrhage from fall and/or anticoagulation with warfarin, infected material and less likely cancers. Given hemodynamic stability, will correct  INR and pursue pericardial window by Cardiothoracic surgery in AM to evacuate effusion and send tissue off for pathology. I had a long conversation with patient, husband and daughter.  I reviewed the case with the interventional attending on-call and Dr. Diona Browner before consulting Dr. Cornelius Moras with Cardiothoracic surgery for pericardial window. I have made [name] NPO, gentle IV fluids, 10 mg IV vitamin K given, 4 units FFP and 4 units PRBC type and screen for OR. Will try to obtain Echo first thing in AM.  - continue to hold warfarin - holding antihypertensives  - losartan, nifedipine, metoprolol given normotension and NPO and RV compression from pericardial effusion - monitoring in stepdown with telemetry - IV fluids 100 ml/hr x 15 hours - update thyroid panel  Ndeye Tenorio 11/07/2012, 11:52 PM

## 2012-11-07 NOTE — ED Notes (Addendum)
Pt complains of SOB with minimal exertion. She has been dealing with SOB while walking for a few months but states "the past few days it has really escalated." Denies chest pain. Pt states she had nausea Sat and Sunday but no vomiting.  Pt on coumadin and aspirin.

## 2012-11-07 NOTE — ED Provider Notes (Signed)
History     CSN: [id number]  Arrival date & time 11/07/12  1729   First MD Initiated Contact with Patient 11/07/12 1758      Chief Complaint  Patient presents with  . Shortness of Breath    (Consider location/radiation/quality/duration/timing/severity/associated sxs/prior treatment) HPI Comments: Patient presents to the ER for evaluation of shortness of breath. Patient reports that she does have a history of shortness of breath secondary to scleroderma and crest syndrome. The last week she has been short of breath with exertion, but in the last 3 days she has had significant worsening. Patient has had a dry nonproductive cough this period of time as well. She has not had any fevers.  Patient is a 57 y.o. female presenting with shortness of breath.  Shortness of Breath Associated symptoms: no chest pain     Past Medical History  Diagnosis Date  . DVT (deep venous thrombosis)   . CREST syndrome   . Pericardial effusion   . MVP (mitral valve prolapse)   . Antiphospholipid antibody syndrome   . ITP (idiopathic thrombocytopenic purpura)   . Raynaud's phenomenon     Past Surgical History  Procedure Laterality Date  . Splenectomy      ITP  . Uterine ablation    . Tubal ligation      Family History  Problem Relation Age of Onset  . Cancer Mother     Lung  . Diabetes Father   . COPD Father   . Hyperlipidemia Father   . Hypertension Father     History  Substance Use Topics  . Smoking status: Never Smoker   . Smokeless tobacco: Never Used  . Alcohol Use: No    OB History   Grav Para Term Preterm Abortions TAB SAB Ect Mult Living                  Review of Systems  Respiratory: Positive for shortness of breath.   Cardiovascular: Negative for chest pain.  All other systems reviewed and are negative.    Allergies  Codeine; Demerol; and Ultram  Home Medications   Current Outpatient Rx  Name  Route  Sig  Dispense  Refill  . azithromycin (AZASITE) 1 %  ophthalmic solution   Both Eyes   Place 1 drop into both eyes 2 (two) times daily.         . Calcium Carbonate-Vitamin D (CALCIUM + D PO)   Oral   Take 1 tablet by mouth daily.         . CYCLOBENZAPRINE HCL PO   Oral   Take 1 tablet by mouth at bedtime as needed (muscle spasms).         Marland Kitchen esomeprazole (NEXIUM) 40 MG capsule   Oral   Take 40 mg by mouth daily before breakfast.         . gabapentin (NEURONTIN) 100 MG capsule   Oral   Take 400 mg by mouth 2 (two) times daily.          Marland Kitchen HYDROcodone-acetaminophen (NORCO/VICODIN) 5-325 MG per tablet   Oral   Take 1-2 tablets by mouth every 6 (six) hours as needed for pain.         Marland Kitchen ipratropium (ATROVENT) 0.06 % nasal spray   Nasal   Place 2 sprays into the nose 4 (four) times daily as needed for rhinitis.         Marland Kitchen losartan (COZAAR) 50 MG tablet   Oral   Take 50  mg by mouth at bedtime.          Marland Kitchen loteprednol (ALREX) 0.2 % SUSP   Both Eyes   Place 1 drop into both eyes 4 (four) times daily.         . metoprolol tartrate (LOPRESSOR) 25 MG tablet   Oral   Take 25 mg by mouth 4 (four) times daily as needed. May take up to 4 times a day as needed for palps         . mometasone (NASONEX) 50 MCG/ACT nasal spray   Nasal   Place 2 sprays into the nose every morning.         Marland Kitchen NIFEdipine (PROCARDIA-XL/ADALAT CC) 30 MG 24 hr tablet   Oral   Take 30 mg by mouth 2 (two) times daily.          Bertram Gala Glycol-Propyl Glycol (SYSTANE ULTRA OP)   Ophthalmic   Apply 1 drop to eye 4 (four) times daily as needed (dry eyes).         . progesterone (PROMETRIUM) 100 MG capsule   Oral   Take 100 mg by mouth at bedtime.          . rifaximin (XIFAXAN) 550 MG TABS   Oral   Take 550 mg by mouth daily.         . Risedronate Sodium (ACTONEL PO)   Oral   Take 1 tablet by mouth every 30 (thirty) days.           BP 163/94  Pulse 97  Temp(Src) 98 F (36.7 C) (Oral)  Resp 20  SpO2 92%  Physical Exam   Constitutional: She is oriented to person, place, and time. She appears well-developed and well-nourished. She appears distressed.  HENT:  Head: Normocephalic and atraumatic.  Right Ear: Hearing normal.  Nose: Nose normal.  Mouth/Throat: Oropharynx is clear and moist and mucous membranes are normal.  Eyes: Conjunctivae and EOM are normal. Pupils are equal, round, and reactive to light.  Neck: Normal range of motion. Neck supple.  Cardiovascular: Normal rate, regular rhythm, S1 normal and S2 normal.  Exam reveals no gallop and no friction rub.   No murmur heard. Pulmonary/Chest: Tachypnea noted. No respiratory distress. She has decreased breath sounds in the right lower field and the left lower field. She has rales in the right lower field and the left lower field. She exhibits no tenderness.  Abdominal: Soft. Normal appearance and bowel sounds are normal. There is no hepatosplenomegaly. There is no tenderness. There is no rebound, no guarding, no tenderness at McBurney's point and negative Murphy's sign. No hernia.  Musculoskeletal: Normal range of motion.  Neurological: She is alert and oriented to person, place, and time. She has normal strength. No cranial nerve deficit or sensory deficit. Coordination normal. GCS eye subscore is 4. GCS verbal subscore is 5. GCS motor subscore is 6.  Skin: Skin is warm, dry and intact. No rash noted. No cyanosis.  Psychiatric: She has a normal mood and affect. Her speech is normal and behavior is normal. Thought content normal.    ED Course  Procedures (including critical care time)  Labs Reviewed  CBC - Abnormal; Notable for the following:    RBC 3.64 (*)    Hemoglobin 10.7 (*)    HCT 31.9 (*)    All other components within normal limits  PRO B NATRIURETIC PEPTIDE - Abnormal; Notable for the following:    Pro B Natriuretic peptide (BNP) 557.6 (*)  All other components within normal limits  PROTIME-INR - Abnormal; Notable for the following:     Prothrombin Time 35.4 (*)    INR 3.83 (*)    All other components within normal limits  BASIC METABOLIC PANEL  POCT I-STAT TROPONIN I   Dg Chest 2 View  11/07/2012  *RADIOLOGY REPORT*  Clinical Data: Short of breath  CHEST - 2 VIEW  Comparison: 09/03/2012  Findings: Borderline cardiomegaly.  Small pleural effusions.  Mild vascular congestion.  Subsegmental atelectasis at the left base. No pneumothorax.  IMPRESSION: Borderline cardiomegaly with vascular congestion and small pleural effusions.   Original Report Authenticated By: Jolaine Click, M.D.      Diagnoses: Dyspnea CHF versus crest syndrome related    MDM  Patient presents to the ER for evaluation of shortness of breath. She has had slight shortness of breath with exertion but no chest pain for some time. In the last 3 days, however, her breathing has significantly worsened. She has a history of lupus and additional connective tissue disorders. Initial workup did not show any significant findings that would explain shortness of breath. CT angiography was therefore ordered and it is anticipated the patient will be admitted. Signed out time of shift change for definitive disposition.        Bonnie Crease, MD 11/11/12 [phone]

## 2012-11-07 NOTE — ED Provider Notes (Signed)
This is a signout from attending, Dr. Blinda Leatherwood at shift change. [name] is a 57 y.o. female past medical history significant for lupus, scleroderma with crest syndrome, to prior DVTs anticoagulated complaining of shortness of breath and dyspnea on exertion significantly worsening over the past 3 days. Patient denies chest pain. Patient is pending CT to rule out PE. Patient follows with cardiologist Dr. Antoine Poche for atrial fibrillation.  Patient seen and examined at the bedside. She is resting comfortably with her family surrounding her. Patient states that she is not short of breath as long as she is not actively moving. She is saturating at about 97% on 4 L via nasal cannula. Patient's lung sounds show generally a poor air movement in all fields.  CT results verbally reported by radiologist as follows. There is a large pericardial effusion it is loculated it is located in the right anterior area and it is constricting the right ventricle there are small bilateral pleural effusions.  Discussed case with attending Dr. Bernette Mayers. [facility] will be consult initially she follows with Dr. Antoine Poche.   Large pericardial effusion, loculated in the right anterior area constricting the right ventricle bilateral pleural effusions greater on the right.  Patient stable vital signs with strong pressures.  Cardiology consult from [facility] cardiologist fellow Dr. Tresa Endo appreciated: He will discuss the case with cardiothoracic surgery.  Plan is to keep the patient n.p.o. after midnight with a scheduled paracardial window in the a.m.  I have discussed the findings and plan with the patient and her family at length. Written orders for antianxiety medications when necessary.   Patient will be admitted to cardiology service.   Bonnie Emery, PA-C 11/09/12 0105

## 2012-11-08 ENCOUNTER — Inpatient Hospital Stay (HOSPITAL_COMMUNITY): Payer: 59

## 2012-11-08 ENCOUNTER — Encounter (HOSPITAL_COMMUNITY): Payer: Self-pay | Admitting: Anesthesiology

## 2012-11-08 ENCOUNTER — Inpatient Hospital Stay (HOSPITAL_COMMUNITY): Payer: 59 | Admitting: Anesthesiology

## 2012-11-08 ENCOUNTER — Encounter (HOSPITAL_COMMUNITY)
Admission: EM | Disposition: A | Discharge status: Home / Self Care | Payer: Self-pay | Source: Home / Self Care | Attending: Internal Medicine

## 2012-11-08 ENCOUNTER — Encounter (HOSPITAL_COMMUNITY): Payer: Self-pay | Admitting: *Deleted

## 2012-11-08 DIAGNOSIS — I319 Disease of pericardium, unspecified: Secondary | ICD-10-CM

## 2012-11-08 DIAGNOSIS — I309 Acute pericarditis, unspecified: Secondary | ICD-10-CM

## 2012-11-08 DIAGNOSIS — I82409 Acute embolism and thrombosis of unspecified deep veins of unspecified lower extremity: Secondary | ICD-10-CM | POA: Insufficient documentation

## 2012-11-08 DIAGNOSIS — M341 CR(E)ST syndrome: Secondary | ICD-10-CM | POA: Diagnosis present

## 2012-11-08 DIAGNOSIS — I312 Hemopericardium, not elsewhere classified: Principal | ICD-10-CM

## 2012-11-08 HISTORY — PX: SUBXYPHOID PERICARDIAL WINDOW: SHX5075

## 2012-11-08 HISTORY — DX: Cr(e)st syndrome: M34.1

## 2012-11-08 LAB — PROTIME-INR
INR: 3.54 — ABNORMAL HIGH (ref 0.00–1.49)
INR: 1.84 — ABNORMAL HIGH (ref 0.00–1.49)
Prothrombin Time: 20.6 seconds — ABNORMAL HIGH (ref 11.6–15.2)

## 2012-11-08 LAB — URINALYSIS, ROUTINE W REFLEX MICROSCOPIC
Bilirubin Urine: NEGATIVE
Specific Gravity, Urine: 1.036 — ABNORMAL HIGH (ref 1.005–1.030)
Urobilinogen, UA: 0.2 mg/dL (ref 0.0–1.0)

## 2012-11-08 LAB — COMPREHENSIVE METABOLIC PANEL
Alkaline Phosphatase: 142 U/L — ABNORMAL HIGH (ref 39–117)
BUN: 10 mg/dL (ref 6–23)
CO2: 23 mEq/L (ref 19–32)
Chloride: 103 mEq/L (ref 96–112)
GFR calc Af Amer: >90 mL/min (ref >90)
Glucose, Bld: 110 mg/dL — ABNORMAL HIGH (ref 70–99)
Potassium: 3.1 mEq/L — ABNORMAL LOW (ref 3.5–5.1)
Total Bilirubin: 0.3 mg/dL (ref 0.3–1.2)

## 2012-11-08 LAB — CBC
MCV: 86.9 fL (ref 78.0–100.0)
Platelets: 440 10*3/uL — ABNORMAL HIGH (ref 150–400)
RDW: 15.2 % (ref 11.5–15.5)
WBC: 10.5 10*3/uL (ref 4.0–10.5)

## 2012-11-08 LAB — BODY FLUID CELL COUNT WITH DIFFERENTIAL
Eos, Fluid: 3 %
Monocyte-Macrophage-Serous Fluid: 3 % — ABNORMAL LOW (ref 50–90)
Other Cells, Fluid: 1 %
Total Nucleated Cell Count, Fluid: 1100 cu mm — ABNORMAL HIGH (ref 0–1000)

## 2012-11-08 LAB — PROTEIN, BODY FLUID: Total protein, fluid: 5.8 g/dL

## 2012-11-08 LAB — LACTATE DEHYDROGENASE, PLEURAL OR PERITONEAL FLUID

## 2012-11-08 LAB — URINE MICROSCOPIC-ADD ON

## 2012-11-08 LAB — APTT: aPTT: 60 seconds — ABNORMAL HIGH (ref 24–37)

## 2012-11-08 LAB — MRSA PCR SCREENING: MRSA by PCR: POSITIVE — AB

## 2012-11-08 SURGERY — CREATION, PERICARDIAL WINDOW, SUBXIPHOID APPROACH
Anesthesia: General | Wound class: Clean

## 2012-11-08 MED ORDER — SODIUM CHLORIDE 0.9 % IJ SOLN
3.0000 mL | Freq: Two times a day (BID) | INTRAMUSCULAR | Status: DC
  Administered 2012-11-08: 3 mL via INTRAVENOUS

## 2012-11-08 MED ORDER — VITAMIN K1 10 MG/ML IJ SOLN
10.0000 mg | Freq: Once | INTRAVENOUS | Status: AC
  Administered 2012-11-08: 10 mg via INTRAVENOUS
  Filled 2012-11-08: qty 1

## 2012-11-08 MED ORDER — ESOMEPRAZOLE MAGNESIUM 40 MG PO CPDR
40.0000 mg | DELAYED_RELEASE_CAPSULE | Freq: Two times a day (BID) | ORAL | Status: DC
  Administered 2012-11-08 – 2012-11-14 (×12): 40 mg via ORAL
  Filled 2012-11-08 (×13): qty 1

## 2012-11-08 MED ORDER — MORPHINE SULFATE 4 MG/ML IJ SOLN
4.0000 mg | INTRAMUSCULAR | Status: DC | PRN
  Administered 2012-11-08: 4 mg via INTRAVENOUS
  Filled 2012-11-08: qty 1

## 2012-11-08 MED ORDER — ONDANSETRON HCL 4 MG/2ML IJ SOLN: 4.0000 mg | Freq: Once | INTRAMUSCULAR | Status: DC | PRN

## 2012-11-08 MED ORDER — CEFUROXIME SODIUM 750 MG IJ SOLR
INTRAMUSCULAR | Status: AC
  Filled 2012-11-08: qty 1500

## 2012-11-08 MED ORDER — FENTANYL CITRATE 0.05 MG/ML IJ SOLN
INTRAMUSCULAR | Status: DC | PRN
  Administered 2012-11-08: 50 ug via INTRAVENOUS
  Administered 2012-11-08 (×2): 100 ug via INTRAVENOUS

## 2012-11-08 MED ORDER — PANTOPRAZOLE SODIUM 40 MG PO TBEC: 40.0000 mg | DELAYED_RELEASE_TABLET | Freq: Every day | ORAL | Status: DC

## 2012-11-08 MED ORDER — POTASSIUM CHLORIDE 10 MEQ/100ML IV SOLN: 10.0000 meq | INTRAVENOUS | Status: DC

## 2012-11-08 MED ORDER — LIDOCAINE HCL (CARDIAC) 20 MG/ML IV SOLN
INTRAVENOUS | Status: DC | PRN
  Administered 2012-11-08: 100 mg via INTRAVENOUS

## 2012-11-08 MED ORDER — OXYCODONE HCL 5 MG PO TABS: 5.0000 mg | ORAL_TABLET | Freq: Once | ORAL | Status: DC | PRN

## 2012-11-08 MED ORDER — CEFUROXIME SODIUM 1.5 G IJ SOLR
1.5000 g | INTRAMUSCULAR | Status: DC
  Filled 2012-11-08: qty 1.5

## 2012-11-08 MED ORDER — HYDROCODONE-ACETAMINOPHEN 5-325 MG PO TABS: 1.0000 | ORAL_TABLET | Freq: Four times a day (QID) | ORAL | Status: DC | PRN

## 2012-11-08 MED ORDER — IPRATROPIUM BROMIDE 0.06 % NA SOLN: 2.0000 | Freq: Four times a day (QID) | NASAL | Status: DC | PRN

## 2012-11-08 MED ORDER — ONDANSETRON HCL 4 MG/2ML IJ SOLN: 4.0000 mg | Freq: Four times a day (QID) | INTRAMUSCULAR | Status: DC | PRN

## 2012-11-08 MED ORDER — PANTOPRAZOLE SODIUM 40 MG PO TBEC: 80.0000 mg | DELAYED_RELEASE_TABLET | Freq: Two times a day (BID) | ORAL | Status: DC

## 2012-11-08 MED ORDER — BIOTENE DRY MOUTH MT LIQD
15.0000 mL | Freq: Two times a day (BID) | OROMUCOSAL | Status: DC
  Administered 2012-11-09 – 2012-11-14 (×11): 15 mL via OROMUCOSAL

## 2012-11-08 MED ORDER — ARTIFICIAL TEARS OP OINT
TOPICAL_OINTMENT | OPHTHALMIC | Status: DC | PRN
  Administered 2012-11-08: 1 via OPHTHALMIC

## 2012-11-08 MED ORDER — DEXTROSE 5 % IV SOLN
1.5000 g | Freq: Two times a day (BID) | INTRAVENOUS | Status: AC
  Administered 2012-11-08 – 2012-11-09 (×2): 1.5 g via INTRAVENOUS
  Filled 2012-11-08 (×2): qty 1.5

## 2012-11-08 MED ORDER — FENTANYL 10 MCG/ML IV SOLN
INTRAVENOUS | Status: DC
  Administered 2012-11-08: 15 ug via INTRAVENOUS
  Administered 2012-11-08: 235.2 ug via INTRAVENOUS
  Administered 2012-11-08: 23:00:00 via INTRAVENOUS
  Administered 2012-11-08: 60 ug via INTRAVENOUS
  Administered 2012-11-09: 186.9 ug via INTRAVENOUS
  Administered 2012-11-09: 60 ug via INTRAVENOUS
  Filled 2012-11-08 (×4): qty 50

## 2012-11-08 MED ORDER — DIPHENHYDRAMINE HCL 50 MG/ML IJ SOLN
12.5000 mg | Freq: Four times a day (QID) | INTRAMUSCULAR | Status: DC | PRN
  Filled 2012-11-08: qty 0.25

## 2012-11-08 MED ORDER — SODIUM CHLORIDE 0.9 % IJ SOLN: 3.0000 mL | INTRAMUSCULAR | Status: DC | PRN

## 2012-11-08 MED ORDER — LACTATED RINGERS IV SOLN
INTRAVENOUS | Status: DC | PRN
  Administered 2012-11-08: 11:00:00 via INTRAVENOUS

## 2012-11-08 MED ORDER — PROGESTERONE MICRONIZED 100 MG PO CAPS
100.0000 mg | ORAL_CAPSULE | Freq: Every day | ORAL | Status: DC
  Administered 2012-11-08 – 2012-11-14 (×6): 100 mg via ORAL
  Filled 2012-11-08 (×7): qty 1

## 2012-11-08 MED ORDER — MEPERIDINE HCL 25 MG/ML IJ SOLN: 6.2500 mg | INTRAMUSCULAR | Status: DC | PRN

## 2012-11-08 MED ORDER — FLUTICASONE PROPIONATE 50 MCG/ACT NA SUSP
1.0000 | Freq: Every day | NASAL | Status: DC
  Administered 2012-11-09 – 2012-11-14 (×6): 1 via NASAL
  Filled 2012-11-08: qty 16

## 2012-11-08 MED ORDER — RIFAXIMIN 550 MG PO TABS
550.0000 mg | ORAL_TABLET | Freq: Every day | ORAL | Status: DC
  Administered 2012-11-09 – 2012-11-14 (×7): 550 mg via ORAL
  Filled 2012-11-08 (×7): qty 1

## 2012-11-08 MED ORDER — DEXTROSE 5 % IV SOLN
1.5000 g | INTRAVENOUS | Status: DC | PRN
  Administered 2012-11-08: 1.5 g via INTRAVENOUS

## 2012-11-08 MED ORDER — VECURONIUM BROMIDE 10 MG IV SOLR
INTRAVENOUS | Status: DC | PRN
  Administered 2012-11-08: 7 mg via INTRAVENOUS

## 2012-11-08 MED ORDER — HYDROMORPHONE HCL PF 1 MG/ML IJ SOLN
INTRAMUSCULAR | Status: AC
  Filled 2012-11-08: qty 1

## 2012-11-08 MED ORDER — LACTATED RINGERS IV SOLN
INTRAVENOUS | Status: DC | PRN
  Administered 2012-11-08: 10:00:00 via INTRAVENOUS

## 2012-11-08 MED ORDER — ACETAMINOPHEN 10 MG/ML IV SOLN
1000.0000 mg | Freq: Four times a day (QID) | INTRAVENOUS | Status: AC
  Administered 2012-11-08 – 2012-11-09 (×4): 1000 mg via INTRAVENOUS
  Filled 2012-11-08 (×5): qty 100

## 2012-11-08 MED ORDER — DEXTROSE 5 % IV SOLN: 1.5000 g | INTRAVENOUS | Status: DC

## 2012-11-08 MED ORDER — POTASSIUM CHLORIDE 10 MEQ/50ML IV SOLN
10.0000 meq | Freq: Every day | INTRAVENOUS | Status: DC | PRN
  Administered 2012-11-09: 10 meq via INTRAVENOUS
  Filled 2012-11-08: qty 50

## 2012-11-08 MED ORDER — DIPHENHYDRAMINE HCL 12.5 MG/5ML PO ELIX
12.5000 mg | ORAL_SOLUTION | Freq: Four times a day (QID) | ORAL | Status: DC | PRN
  Filled 2012-11-08: qty 5

## 2012-11-08 MED ORDER — SUCCINYLCHOLINE CHLORIDE 20 MG/ML IJ SOLN
INTRAMUSCULAR | Status: DC | PRN
  Administered 2012-11-08: 140 mg via INTRAVENOUS

## 2012-11-08 MED ORDER — HYDROMORPHONE HCL PF 1 MG/ML IJ SOLN
INTRAMUSCULAR | Status: DC | PRN
  Administered 2012-11-08: 1 mg via INTRAVENOUS

## 2012-11-08 MED ORDER — OXYCODONE HCL 5 MG/5ML PO SOLN: 5.0000 mg | Freq: Once | ORAL | Status: DC | PRN

## 2012-11-08 MED ORDER — BISACODYL 5 MG PO TBEC
10.0000 mg | DELAYED_RELEASE_TABLET | Freq: Every day | ORAL | Status: DC
  Administered 2012-11-08 – 2012-11-13 (×5): 10 mg via ORAL
  Filled 2012-11-08: qty 1
  Filled 2012-11-08 (×5): qty 2

## 2012-11-08 MED ORDER — SODIUM CHLORIDE 0.9 % IJ SOLN: 9.0000 mL | INTRAMUSCULAR | Status: DC | PRN

## 2012-11-08 MED ORDER — NEOSTIGMINE METHYLSULFATE 1 MG/ML IJ SOLN
INTRAMUSCULAR | Status: DC | PRN
  Administered 2012-11-08: 4 mg via INTRAVENOUS

## 2012-11-08 MED ORDER — LORAZEPAM 2 MG/ML IJ SOLN
0.5000 mg | INTRAMUSCULAR | Status: DC | PRN
  Administered 2012-11-11 – 2012-11-13 (×2): 0.5 mg via INTRAVENOUS
  Filled 2012-11-08 (×2): qty 1

## 2012-11-08 MED ORDER — HYDROMORPHONE HCL PF 1 MG/ML IJ SOLN
0.2500 mg | INTRAMUSCULAR | Status: DC | PRN
  Administered 2012-11-08 (×4): 0.5 mg via INTRAVENOUS

## 2012-11-08 MED ORDER — ETOMIDATE 2 MG/ML IV SOLN
INTRAVENOUS | Status: DC | PRN
  Administered 2012-11-08: 14 mg via INTRAVENOUS

## 2012-11-08 MED ORDER — 0.9 % SODIUM CHLORIDE (POUR BTL) OPTIME
TOPICAL | Status: DC | PRN
  Administered 2012-11-08: 2000 mL

## 2012-11-08 MED ORDER — CYCLOBENZAPRINE HCL 10 MG PO TABS: 5.0000 mg | ORAL_TABLET | Freq: Every evening | ORAL | Status: DC | PRN

## 2012-11-08 MED ORDER — SODIUM CHLORIDE 0.9 % IV SOLN: 250.0000 mL | INTRAVENOUS | Status: DC | PRN

## 2012-11-08 MED ORDER — ONDANSETRON HCL 4 MG/2ML IJ SOLN
INTRAMUSCULAR | Status: DC | PRN
  Administered 2012-11-08 (×2): 4 mg via INTRAVENOUS

## 2012-11-08 MED ORDER — SODIUM CHLORIDE 0.9 % IJ SOLN: 3.0000 mL | Freq: Two times a day (BID) | INTRAMUSCULAR | Status: DC

## 2012-11-08 MED ORDER — LOTEPREDNOL ETABONATE 0.5 % OP SUSP
1.0000 [drp] | Freq: Three times a day (TID) | OPHTHALMIC | Status: DC
  Filled 2012-11-08: qty 5

## 2012-11-08 MED ORDER — WHITE PETROLATUM GEL
Status: AC
  Administered 2012-11-08: 03:00:00
  Filled 2012-11-08: qty 5

## 2012-11-08 MED ORDER — INSULIN ASPART 100 UNIT/ML ~~LOC~~ SOLN: 0.0000 [IU] | SUBCUTANEOUS | Status: DC

## 2012-11-08 MED ORDER — NALOXONE HCL 0.4 MG/ML IJ SOLN
0.4000 mg | INTRAMUSCULAR | Status: DC | PRN
  Filled 2012-11-08: qty 1

## 2012-11-08 MED ORDER — CALCIUM CARBONATE-VITAMIN D 500-200 MG-UNIT PO TABS
1.0000 | ORAL_TABLET | Freq: Every day | ORAL | Status: DC
  Administered 2012-11-09 – 2012-11-14 (×6): 1 via ORAL
  Filled 2012-11-08 (×8): qty 1

## 2012-11-08 MED ORDER — SODIUM CHLORIDE 0.9 % IV SOLN
INTRAVENOUS | Status: DC
  Administered 2012-11-08: 75 mL/h via INTRAVENOUS

## 2012-11-08 MED ORDER — GABAPENTIN 400 MG PO CAPS
400.0000 mg | ORAL_CAPSULE | Freq: Two times a day (BID) | ORAL | Status: DC
  Administered 2012-11-08 – 2012-11-14 (×13): 400 mg via ORAL
  Filled 2012-11-08 (×15): qty 1

## 2012-11-08 MED ORDER — GLYCOPYRROLATE 0.2 MG/ML IJ SOLN
INTRAMUSCULAR | Status: DC | PRN
  Administered 2012-11-08: .8 mg via INTRAVENOUS

## 2012-11-08 MED ORDER — NON FORMULARY: Freq: Two times a day (BID) | Status: DC

## 2012-11-08 MED ORDER — MIDAZOLAM HCL 5 MG/5ML IJ SOLN
INTRAMUSCULAR | Status: DC | PRN
  Administered 2012-11-08: 2 mg via INTRAVENOUS

## 2012-11-08 MED ORDER — AZITHROMYCIN 1 % OP SOLN: 1.0000 [drp] | Freq: Two times a day (BID) | OPHTHALMIC | Status: DC

## 2012-11-08 SURGICAL SUPPLY — 57 items
ADH SKN CLS APL DERMABOND .7 (GAUZE/BANDAGES/DRESSINGS) ×1
APL SKNCLS STERI-STRIP NONHPOA (GAUZE/BANDAGES/DRESSINGS) ×1
ATTRACTOMAT 16X20 MAGNETIC DRP (DRAPES) ×2 IMPLANT
BENZOIN TINCTURE PRP APPL 2/3 (GAUZE/BANDAGES/DRESSINGS) ×2 IMPLANT
BLADE STERNUM SYSTEM 6 (BLADE) ×1 IMPLANT
CANISTER SUCTION 2500CC (MISCELLANEOUS) ×2 IMPLANT
CATH THORACIC 28FR (CATHETERS) IMPLANT
CATH THORACIC 28FR RT ANG (CATHETERS) IMPLANT
CATH THORACIC 36FR (CATHETERS) IMPLANT
CATH THORACIC 36FR RT ANG (CATHETERS) IMPLANT
CLOTH BEACON ORANGE TIMEOUT ST (SAFETY) ×2 IMPLANT
CONN ST 1/4X3/8  BEN (MISCELLANEOUS) ×1
CONN ST 1/4X3/8 BEN (MISCELLANEOUS) IMPLANT
CONT SPEC 4OZ CLIKSEAL STRL BL (MISCELLANEOUS) IMPLANT
COVER SURGICAL LIGHT HANDLE (MISCELLANEOUS) ×4 IMPLANT
DERMABOND ADVANCED (GAUZE/BANDAGES/DRESSINGS) ×1
DERMABOND ADVANCED .7 DNX12 (GAUZE/BANDAGES/DRESSINGS) IMPLANT
DRAIN CHANNEL 28F RND 3/8 FF (WOUND CARE) ×2 IMPLANT
DRAPE LAPAROSCOPIC ABDOMINAL (DRAPES) ×2 IMPLANT
ELECT BLADE 4.0 EZ CLEAN MEGAD (MISCELLANEOUS) ×2
ELECT REM PT RETURN 9FT ADLT (ELECTROSURGICAL) ×2
ELECTRODE BLDE 4.0 EZ CLN MEGD (MISCELLANEOUS) IMPLANT
ELECTRODE REM PT RTRN 9FT ADLT (ELECTROSURGICAL) ×1 IMPLANT
GLOVE BIO SURGEON STRL SZ 6.5 (GLOVE) ×1 IMPLANT
GLOVE BIOGEL PI IND STRL 7.0 (GLOVE) IMPLANT
GLOVE BIOGEL PI INDICATOR 7.0 (GLOVE) ×1
GLOVE ORTHO TXT STRL SZ7.5 (GLOVE) ×4 IMPLANT
GOWN PREVENTION PLUS XLARGE (GOWN DISPOSABLE) ×2 IMPLANT
HEMOSTAT POWDER SURGIFOAM 1G (HEMOSTASIS) IMPLANT
KIT BASIN OR (CUSTOM PROCEDURE TRAY) ×2 IMPLANT
KIT ROOM TURNOVER OR (KITS) ×2 IMPLANT
NS IRRIG 1000ML POUR BTL (IV SOLUTION) ×2 IMPLANT
PACK CHEST (CUSTOM PROCEDURE TRAY) ×2 IMPLANT
PAD ARMBOARD 7.5X6 YLW CONV (MISCELLANEOUS) ×4 IMPLANT
PAD ELECT DEFIB RADIOL ZOLL (MISCELLANEOUS) ×2 IMPLANT
PAD ONESTEP ZOLL R SERIES ADT (MISCELLANEOUS) ×1 IMPLANT
SPONGE GAUZE 4X4 12PLY (GAUZE/BANDAGES/DRESSINGS) ×1 IMPLANT
STRIP CLOSURE SKIN 1/2X4 (GAUZE/BANDAGES/DRESSINGS) ×2 IMPLANT
SUT MNCRL AB 3-0 PS2 18 (SUTURE) ×2 IMPLANT
SUT PDS AB 1 CTX 36 (SUTURE) ×2 IMPLANT
SUT SILK  1 MH (SUTURE) ×1
SUT SILK 1 MH (SUTURE) IMPLANT
SUT VIC AB 1 CTX 18 (SUTURE) ×2 IMPLANT
SUT VIC AB 2-0 CT1 36 (SUTURE) ×2 IMPLANT
SUT VIC AB 3-0 SH 27 (SUTURE) ×2
SUT VIC AB 3-0 SH 27X BRD (SUTURE) IMPLANT
SUT VIC AB 3-0 SH 8-18 (SUTURE) ×2 IMPLANT
SWAB COLLECTION DEVICE MRSA (MISCELLANEOUS) IMPLANT
SYR 50ML SLIP (SYRINGE) IMPLANT
SYSTEM SAHARA CHEST DRAIN ATS (WOUND CARE) IMPLANT
TAPE CLOTH SURG 4X10 WHT LF (GAUZE/BANDAGES/DRESSINGS) ×1 IMPLANT
TOWEL OR 17X24 6PK STRL BLUE (TOWEL DISPOSABLE) ×2 IMPLANT
TOWEL OR 17X26 10 PK STRL BLUE (TOWEL DISPOSABLE) ×2 IMPLANT
TRAP SPECIMEN MUCOUS 40CC (MISCELLANEOUS) ×3 IMPLANT
TRAY FOLEY CATH 14FRSI W/METER (CATHETERS) ×3 IMPLANT
TUBE ANAEROBIC SPECIMEN COL (MISCELLANEOUS) IMPLANT
WATER STERILE IRR 1000ML POUR (IV SOLUTION) ×4 IMPLANT

## 2012-11-08 NOTE — Progress Notes (Signed)
Utilization review completed.  

## 2012-11-08 NOTE — OR Nursing (Signed)
Pericardial fluid specimen sent for Cell count and diff, hematocrit, Glucose,protein, and LDH. Paperwork sent with specimen.

## 2012-11-08 NOTE — Anesthesia Preprocedure Evaluation (Addendum)
Anesthesia Evaluation  Patient identified by MRN, date of birth, ID band Patient awake    Reviewed: Allergy & Precautions, H&P , NPO status , Patient's Chart, lab work & pertinent test results  Airway Mallampati: I TM Distance: >3 FB Neck ROM: Full    Dental  (+) Teeth Intact and Dental Advidsory Given   Pulmonary shortness of breath,          Cardiovascular hypertension, Pt. on medications + dysrhythmias Atrial Fibrillation     Neuro/Psych    GI/Hepatic   Endo/Other    Renal/GU      Musculoskeletal   Abdominal   Peds  Hematology  (+) Blood dyscrasia, ,   Anesthesia Other Findings   Reproductive/Obstetrics                          Anesthesia Physical Anesthesia Plan  ASA: III  Anesthesia Plan: General   Post-op Pain Management:    Induction: Intravenous  Airway Management Planned: Oral ETT  Additional Equipment: Arterial line, CVP and Ultrasound Guidance Line Placement  Intra-op Plan:   Post-operative Plan: Extubation in OR  Informed Consent:   Dental Advisory Given  Plan Discussed with: CRNA, Surgeon and Anesthesiologist  Anesthesia Plan Comments:        Anesthesia Quick Evaluation

## 2012-11-08 NOTE — Consult Note (Signed)
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 The patient notes that she has had some exertional shortness of breath and fatigue that has been gradually progressive for quite some time. However, 2 weeks ago the patient suffered a fall at work resulting in significant injury with fracture of her right wrist and clavicle. She states that time she fell forward and quadricep with her right arm and also landed on her chest and hit her head. She stayed out of work for a couple of weeks but recently had tried to return to light duty. At that time she was noted to have increased pain in her right shoulder, and it was only than the fracture of her right clavicle was discovered. Several days ago she began to develop shortness of breath. Over the last 3 days for shortness of breath has progressed fairly acutely ultimately causing her to present to the emergency department yesterday evening with severe resting shortness of breath and hypoxemia. CT scan of the chest was performed demonstrating large pericardial effusion with evidence suggestive of compression of the right ventricle. The patient was seen by cardiology and at that time noted to be supratherapeutic on Coumadin with INR 3.8. Vitamin K was ordered last night but apparently not given until just a few minutes ago. Cardiothoracic surgical consultation has been requested.  The patient reports resting shortness of breath of recent onset no previous history of exertional shortness of breath. She denies any pain in her  chest but she does have pain in both shoulders and across her upper back. She also has pain in the right anterior shoulder at the site of her clavicular fracture as well as pain in her right forearm and wrist. She denies any dizzy spells, tachypalpitations, or syncope. She has not had lower extremity edema. She denies any fevers chills or other reason sign of infection. She has not had cough.   Past Medical History  Diagnosis Date  . DVT (deep venous thrombosis)   . CREST syndrome   . Pericardial effusion   . MVP (mitral valve prolapse)   . Antiphospholipid antibody syndrome   . ITP (idiopathic thrombocytopenic purpura)   . Raynaud's phenomenon   . Hypertension   . Paroxysmal atrial fibrillation     Past Surgical History  Procedure Laterality Date  . Splenectomy      ITP  . Uterine ablation    . Tubal ligation      Family History  Problem Relation Age of Onset  . Cancer Mother     Lung  . Diabetes Father   . COPD Father   . Hyperlipidemia Father   . Hypertension Father     History   Social History  . Marital Status: Married    Spouse Name: N/A    Number of Children: 2  . Years of Education: N/A   Occupational History  .     Social History Main Topics  . Smoking status: Never Smoker   . Smokeless tobacco: Never Used  . Alcohol Use: No  . Drug Use: No  .  Sexually Active: Not on file   Other Topics Concern  . Not on file   Social History Narrative   Two children and 3 step children and five grandchildren.  Nurse in the ICU    Prior to Admission medications   Medication Sig Start Date End Date Taking? Authorizing Provider  azithromycin (AZASITE) 1 % ophthalmic solution Place 1 drop into both eyes 2 (two) times daily.   Yes Historical Provider, MD  Calcium Carbonate-Vitamin D (CALCIUM + D PO) Take 1 tablet by mouth daily.   Yes Historical Provider, MD  CYCLOBENZAPRINE HCL PO Take 1 tablet by mouth at bedtime as needed (muscle spasms).   Yes Historical  Provider, MD  esomeprazole (NEXIUM) 40 MG capsule Take 40 mg by mouth daily before breakfast.   Yes Historical Provider, MD  gabapentin (NEURONTIN) 100 MG capsule Take 400 mg by mouth 2 (two) times daily.    Yes Historical Provider, MD  HYDROcodone-acetaminophen (NORCO/VICODIN) 5-325 MG per tablet Take 1-2 tablets by mouth every 6 (six) hours as needed for pain. 10/11/12  Yes Jeanella Craze, NP  ipratropium (ATROVENT) 0.06 % nasal spray Place 2 sprays into the nose 4 (four) times daily as needed for rhinitis.   Yes Historical Provider, MD  losartan (COZAAR) 50 MG tablet Take 50 mg by mouth at bedtime.    Yes Historical Provider, MD  loteprednol (ALREX) 0.2 % SUSP Place 1 drop into both eyes 4 (four) times daily.   Yes Historical Provider, MD  metoprolol tartrate (LOPRESSOR) 25 MG tablet Take 25 mg by mouth 4 (four) times daily as needed. May take up to 4 times a day as needed for palps 09/18/11  Yes [name], MD  mometasone (NASONEX) 50 MCG/ACT nasal spray Place 2 sprays into the nose every morning.   Yes Historical Provider, MD  NIFEdipine (PROCARDIA-XL/ADALAT CC) 30 MG 24 hr tablet Take 30 mg by mouth 2 (two) times daily.    Yes Historical Provider, MD  Polyethyl Glycol-Propyl Glycol (SYSTANE ULTRA OP) Apply 1 drop to eye 4 (four) times daily as needed (dry eyes).   Yes Historical Provider, MD  progesterone (PROMETRIUM) 100 MG capsule Take 100 mg by mouth at bedtime.    Yes Historical Provider, MD  rifaximin (XIFAXAN) 550 MG TABS Take 550 mg by mouth daily.   Yes Historical Provider, MD  Risedronate Sodium (ACTONEL PO) Take 1 tablet by mouth every 30 (thirty) days.   Yes Historical Provider, MD    Current Facility-Administered Medications  Medication Dose Route Frequency Provider Last Rate Last Dose  . 0.9 %  sodium chloride infusion  250 mL Intravenous PRN Leeann Must, MD      . Melene Muller ON 11/09/2012] antiseptic oral rinse (BIOTENE) solution 15 mL  15 mL Mouth Rinse BID Leeann Must, MD      .  azithromycin (AZASITE) 1 % ophthalmic solution 1 drop  1 drop Both Eyes BID Leeann Must, MD      . calcium-vitamin D (OSCAL WITH D) 500-200 MG-UNIT per tablet 1 tablet  1 tablet Oral Q breakfast Leeann Must, MD      . cyclobenzaprine (FLEXERIL) tablet 5 mg  5 mg Oral QHS PRN Leeann Must, MD      . fluticasone [facility]) 50 MCG/ACT nasal spray 1 spray  1 spray Each Nare Daily Leeann Must, MD      . gabapentin (NEURONTIN) capsule 400 mg  400 mg Oral BID Leeann Must, MD   400 mg at 11/08/12 0322  .  HYDROcodone-acetaminophen (NORCO/VICODIN) 5-325 MG per tablet 1-2 tablet  1-2 tablet Oral Q6H PRN Leeann Must, MD      . ipratropium (ATROVENT) 0.06 % nasal spray 2 spray  2 spray Nasal QID PRN Leeann Must, MD      . LORazepam (ATIVAN) injection 0.5 mg  0.5 mg Intravenous Q4H PRN Nicole Pisciotta, PA-C      . loteprednol (LOTEMAX) 0.5 % ophthalmic suspension 1 drop  1 drop Both Eyes TID AC & HS Leeann Must, MD      . morphine 4 MG/ML injection 4 mg  4 mg Intravenous Q3H PRN [name], PA-C   4 mg at 11/08/12 1610  . ondansetron (ZOFRAN) injection 4 mg  4 mg Intravenous Q6H PRN Nicole Pisciotta, PA-C      . pantoprazole (PROTONIX) EC tablet 40 mg  40 mg Oral Daily Leeann Must, MD      . phytonadione (VITAMIN K) 10 mg in dextrose 5 % 50 mL IVPB  10 mg Intravenous Once Purcell Nails, MD   10 mg at 11/08/12 0809  . progesterone (PROMETRIUM) capsule 100 mg  100 mg Oral QHS Leeann Must, MD      . rifaximin [facility]) tablet 550 mg  550 mg Oral Daily Leeann Must, MD      . sodium chloride 0.9 % injection 3 mL  3 mL Intravenous Q12H Leeann Must, MD   3 mL at 11/08/12 0215  . sodium chloride 0.9 % injection 3 mL  3 mL Intravenous Q12H Leeann Must, MD      . sodium chloride 0.9 % injection 3 mL  3 mL Intravenous PRN Leeann Must, MD        Allergies  Allergen Reactions  . Codeine Nausea And Vomiting  . Demerol   . Ultram (Tramadol Hcl) Other (See Comments)    Makes her hands shake.      Review of  Systems:  Constitutional: Positive for malaise/fatigue. Negative for fever, chills and weight loss.  HENT: Negative for hearing loss.  Eyes: Negative for double vision, photophobia and pain.  Respiratory: Positive for shortness of breath. Negative for cough and hemoptysis.  Cardiovascular: Positive for leg swelling. Negative for chest pain and palpitations.  Gastrointestinal: Positive for heartburn. Negative for nausea, vomiting and abdominal pain.  Genitourinary: Negative for dysuria, urgency and frequency.  Musculoskeletal: Positive for joint pain and falls. Negative for myalgias.  Skin: Negative for itching and rash.  Neurological: Negative for dizziness, tingling, tremors and headaches.  Endo/Heme/Allergies: Negative for environmental allergies and polydipsia. Bruises/bleeds easily.  Psychiatric/Behavioral: Negative for depression, suicidal ideas and substance abuse.      Physical Exam:   BP 128/81  Pulse 91  Temp(Src) 98.5 F (36.9 C) (Oral)  Resp 14  Ht 5\' 6"  (1.676 m)  Wt 83.5 kg (184 lb 1.4 oz)  BMI 29.73 kg/m2  SpO2 97%  General:  Obese, chronically ill-appearing  HEENT:  Unremarkable   Neck:   no JVD, no bruits, no adenopathy   Chest:   clear to auscultation, symmetrical breath sounds, no wheezes, no rhonchi   CV:   RRR, no murmur   Abdomen:  soft, non-tender, no masses   Extremities:  warm, well-perfused, pulses thready  Rectal/GU  Deferred  Neuro:   Grossly non-focal and symmetrical throughout  Skin:   Clean and dry, no rashes, no breakdown  Diagnostic Tests:  *RADIOLOGY REPORT*  Clinical Data: Severe shortness of breath with exertion. Recent  wrist and clavicular fractures.  CT ANGIOGRAPHY  CHEST  Technique: Multidetector CT imaging of the chest using the  standard protocol during bolus administration of intravenous  contrast. Multiplanar reconstructed images including MIPs were  obtained and reviewed to evaluate the vascular anatomy.  Contrast:  OMNIPAQUE IOHEXOL 350 MG/ML SOLN  Comparison: 08/30/2010  Findings: Heterogeneous enlargement of the left lobe of the thyroid  extending into the substernal space and displacing the trachea  towards the right. Mass measures about 4.3 x 5.1 cm. There is  some calcification. The appearance is stable since previous study.  Technically adequate study with good opacification of the central  and segmental pulmonary arteries. No focal filling defects  demonstrated. No evidence of significant pulmonary embolus. There  is interval development of a large pericardial effusion which  appears loculated towards the right anteriorly. Density  measurements are increased, suggesting that this might be  hemorrhagic or infected. The effusion causes constriction of the  right ventricle. Correlate for clinical signs of cardiac  tamponade. There is reflux of contrast material into the inferior  vena cava.  New bilateral pleural effusions, greater on the right, with  atelectasis or consolidation in the lung bases. The esophagus is  distended and filled with fluid, gas, and heterogeneous material  consistent with food. This may represent esophageal dysmotility or  achalasia. Scattered lymph nodes in the mediastinum and hilar  regions are not pathologically enlarged. Airways appear patent.  No pneumothorax. Mild degenerative changes of the thoracic spine.  IMPRESSION:  No evidence of significant pulmonary embolus. Large pericardial  effusion appears loculated in the right anteriorly and causing  constriction of the right ventricle. Bilateral pleural effusions,  greater on the right, with atelectasis in both lung bases.  Esophageal dilatation suggesting achalasia or dysmotility. Stable  large left thyroid goiter.  Results were discussed by telephone with Dr. Bernette Mayers at 2127 hours  on 11/07/2012.  Original Report Authenticated By: Burman Nieves, M.D.   Results for CLIFFORD, COUDRIET (MRN [id number]) as of  11/08/2012 08:14  Ref. Range 11/07/2012 18:28 11/08/2012 02:30  Prothrombin Time Latest Range: 11.6-15.2 seconds 35.4 (H) 33.4 (H)  INR Latest Range: 0.00-1.49  3.83 (H) 3.54 (H)     Impression:  Large pericardial effusion which may be partially loculated and is likely hemorrhagic given the patient's long-term anticoagulation with Coumadin and recent history of trauma. The patient remains clinically stable but clearly has signs of right ventricular collapse on CT scan. I agree that prompt pericardial window is the best course of therapy. Unfortunately the patient apparently did not receive the vitamin K that was ordered yesterday evening until just a few moments ago.   Plan:  I discussed matters at length with the patient and her family at the bedside. We will administer another dose of vitamin K as well as 2 units of fresh frozen plasma and subsequent plan to proceed directly to the operative later this morning for subxiphoid pericardial window.  The patient and her family understand and accept all potential associated risks of surgery and desired to proceed as planned. All questions have been answered.    Salvatore Decent. Cornelius Moras, MD 11/08/2012 8:16 AM  I spent in excess of 30 minutes of time directly involved in the conduct of this consultation.

## 2012-11-08 NOTE — Progress Notes (Signed)
Pt. Admitted to room 3314; alert and oriented; vss; plan is for pericardial window in am; vit. K notified for from Pharmacy; call light within reach and explained as well as safety measures explained; NPO for procedure in am; will continue to monitor.  Vivi Martens RN

## 2012-11-08 NOTE — Progress Notes (Signed)
[facility] BRIEF PROGRESS NOTE  Day of Surgery  S/P Procedure(s) (LRB): SUBXYPHOID PERICARDIAL WINDOW (N/A)   Doing well postop Minimal pain Breathing comfortably Vitals stable Bloody pericardial effusion  Plan: Probably best to avoid systemic anticoagulation for at least 48 hours.  Will ask Vascular Surgery or Interventional Radiology to consider IVC filter placement.  OWEN,CLARENCE H 11/08/2012

## 2012-11-08 NOTE — Anesthesia Postprocedure Evaluation (Signed)
Anesthesia Post Note  Patient: [name]  Procedure(s) Performed: Procedure(s) (LRB): SUBXYPHOID PERICARDIAL WINDOW (N/A)  Anesthesia type: general  Patient location: PACU  Post pain: Pain level controlled  Post assessment: Patient's Cardiovascular Status Stable  Last Vitals:  Filed Vitals:   11/08/12 1415  BP:   Pulse:   Temp:   Resp: 17    Post vital signs: Reviewed and stable  Level of consciousness: sedated  Complications: No apparent anesthesia complications

## 2012-11-08 NOTE — Anesthesia Procedure Notes (Signed)
Procedure Name: Intubation Date/Time: 11/08/2012 11:18 AM Performed by: Carmela Rima Pre-anesthesia Checklist: Timeout performed, Patient identified, Emergency Drugs available, Suction available and Patient being monitored Patient Re-evaluated:Patient Re-evaluated prior to inductionOxygen Delivery Method: Circle system utilized Preoxygenation: Pre-oxygenation with 100% oxygen Intubation Type: IV induction, Rapid sequence and Cricoid Pressure applied Ventilation: Mask ventilation without difficulty Laryngoscope Size: Mac and 3 Grade View: Grade I Tube type: Oral Tube size: 7.5 mm Number of attempts: 1 Placement Confirmation: ETT inserted through vocal cords under direct vision,  positive ETCO2 and breath sounds checked- equal and bilateral Secured at: 22 cm Tube secured with: Tape Dental Injury: Teeth and Oropharynx as per pre-operative assessment

## 2012-11-08 NOTE — Progress Notes (Signed)
SUBJECTIVE:  No acute complaints.    PHYSICAL EXAM Filed Vitals:   11/08/12 0400 11/08/12 0630 11/08/12 0915 11/08/12 0932  BP: 128/81   132/84  Pulse: 98 91 88   Temp: 98.5 F (36.9 C)  98.8 F (37.1 C) 98.6 F (37 C)  TempSrc: Oral  Oral Oral  Resp: 21 14 17    Height:      Weight:      SpO2: 96% 97%     General:  No distress Lungs:  Decreased breath sounds at the bases. Heart:  RRR Abdomen:  Positive bowel sounds, no rebound no guarding Extremities:  Mild edema.   LABS: Lab Results  Component Value Date   CKTOTAL 41 08/31/2010   CKMB 0.6 08/31/2010   TROPONINI  Value: 0.05        NO INDICATION OF MYOCARDIAL INJURY. 08/31/2010   Results for orders placed during the hospital encounter of 11/07/12 (from the past 24 hour(s))  CBC     Status: Abnormal   Collection Time    11/07/12  6:28 PM      Result Value Range   WBC 9.8  4.0 - 10.5 K/uL   RBC 3.64 (*) 3.87 - 5.11 MIL/uL   Hemoglobin 10.7 (*) 12.0 - 15.0 g/dL   HCT 11.9 (*) 14.7 - 82.9 %   MCV 87.6  78.0 - 100.0 fL   MCH 29.4  26.0 - 34.0 pg   MCHC 33.5  30.0 - 36.0 g/dL   RDW 56.2  13.0 - 86.5 %   Platelets 388  150 - 400 K/uL  BASIC METABOLIC PANEL     Status: None   Collection Time    11/07/12  6:28 PM      Result Value Range   Sodium 135  135 - 145 mEq/L   Potassium 3.6  3.5 - 5.1 mEq/L   Chloride 102  96 - 112 mEq/L   CO2 21  19 - 32 mEq/L   Glucose, Bld 96  70 - 99 mg/dL   BUN 13  6 - 23 mg/dL   Creatinine, Ser 7.84  0.50 - 1.10 mg/dL   Calcium 9.8  8.4 - 69.6 mg/dL   GFR calc non Af Amer >90  >90 mL/min   GFR calc Af Amer >90  >90 mL/min  PROTIME-INR     Status: Abnormal   Collection Time    11/07/12  6:28 PM      Result Value Range   Prothrombin Time 35.4 (*) 11.6 - 15.2 seconds   INR 3.83 (*) 0.00 - 1.49  PRO B NATRIURETIC PEPTIDE     Status: Abnormal   Collection Time    11/07/12  6:32 PM      Result Value Range   Pro B Natriuretic peptide (BNP) 557.6 (*) 0 - 125 pg/mL  POCT I-STAT  TROPONIN I     Status: None   Collection Time    11/07/12  6:46 PM      Result Value Range   Troponin i, poc 0.02  0.00 - 0.08 ng/mL   Comment 3           PREPARE FRESH FROZEN PLASMA     Status: None   Collection Time    11/08/12  2:27 AM      Result Value Range   Unit Number [id number]     Blood Component Type THAWED PLASMA     Unit division 00     Status of Unit ISSUED  Transfusion Status OK TO TRANSFUSE     Unit Number [id number]     Blood Component Type THAWED PLASMA     Unit division 00     Status of Unit ISSUED     Transfusion Status OK TO TRANSFUSE     Unit Number [id number]     Blood Component Type THAWED PLASMA     Unit division 00     Status of Unit ALLOCATED     Transfusion Status OK TO TRANSFUSE     Unit Number [id number]     Blood Component Type THAWED PLASMA     Unit division 00     Status of Unit ALLOCATED     Transfusion Status OK TO TRANSFUSE    PREPARE RBC (CROSSMATCH)     Status: None   Collection Time    11/08/12  2:28 AM      Result Value Range   Order Confirmation ORDER PROCESSED BY BLOOD BANK    COMPREHENSIVE METABOLIC PANEL     Status: Abnormal   Collection Time    11/08/12  2:30 AM      Result Value Range   Sodium 137  135 - 145 mEq/L   Potassium 3.1 (*) 3.5 - 5.1 mEq/L   Chloride 103  96 - 112 mEq/L   CO2 23  19 - 32 mEq/L   Glucose, Bld 110 (*) 70 - 99 mg/dL   BUN 10  6 - 23 mg/dL   Creatinine, Ser 5.95  0.50 - 1.10 mg/dL   Calcium 9.6  8.4 - 63.8 mg/dL   Total Protein 6.6  6.0 - 8.3 g/dL   Albumin 3.1 (*) 3.5 - 5.2 g/dL   AST 15  0 - 37 U/L   ALT 23  0 - 35 U/L   Alkaline Phosphatase 142 (*) 39 - 117 U/L   Total Bilirubin 0.3  0.3 - 1.2 mg/dL   GFR calc non Af Amer >90  >90 mL/min   GFR calc Af Amer >90  >90 mL/min  CBC     Status: Abnormal   Collection Time    11/08/12  2:30 AM      Result Value Range   WBC 10.5  4.0 - 10.5 K/uL   RBC 3.51 (*) 3.87 - 5.11 MIL/uL   Hemoglobin 10.3 (*) 12.0 - 15.0 g/dL   HCT 75.6  (*) 43.3 - 46.0 %   MCV 86.9  78.0 - 100.0 fL   MCH 29.3  26.0 - 34.0 pg   MCHC 33.8  30.0 - 36.0 g/dL   RDW 29.5  18.8 - 41.6 %   Platelets 440 (*) 150 - 400 K/uL  PROTIME-INR     Status: Abnormal   Collection Time    11/08/12  2:30 AM      Result Value Range   Prothrombin Time 33.4 (*) 11.6 - 15.2 seconds   INR 3.54 (*) 0.00 - 1.49  APTT     Status: Abnormal   Collection Time    11/08/12  2:30 AM      Result Value Range   aPTT 60 (*) 24 - 37 seconds  TYPE AND SCREEN     Status: None   Collection Time    11/08/12  2:40 AM      Result Value Range   ABO/RH(D) B POS     Antibody Screen NEG     Sample Expiration 11/11/2012     Unit Number [id number]     Blood Component Type  RED CELLS,LR     Unit division 00     Status of Unit ALLOCATED     Transfusion Status OK TO TRANSFUSE     Crossmatch Result Compatible     Unit Number [id number]     Blood Component Type RED CELLS,LR     Unit division 00     Status of Unit ALLOCATED     Transfusion Status OK TO TRANSFUSE     Crossmatch Result Compatible     Unit Number [id number]     Blood Component Type RED CELLS,LR     Unit division 00     Status of Unit ALLOCATED     Transfusion Status OK TO TRANSFUSE     Crossmatch Result Compatible     Unit Number [id number]     Blood Component Type RED CELLS,LR     Unit division 00     Status of Unit ALLOCATED     Transfusion Status OK TO TRANSFUSE     Crossmatch Result Compatible    MRSA PCR SCREENING     Status: Abnormal   Collection Time    11/08/12  3:15 AM      Result Value Range   MRSA by PCR POSITIVE (*) NEGATIVE  URINALYSIS, ROUTINE W REFLEX MICROSCOPIC     Status: Abnormal   Collection Time    11/08/12  4:02 AM      Result Value Range   Color, Urine YELLOW  YELLOW   APPearance CLEAR  CLEAR   Specific Gravity, Urine 1.036 (*) 1.005 - 1.030   pH 5.5  5.0 - 8.0   Glucose, UA NEGATIVE  NEGATIVE mg/dL   Hgb urine dipstick TRACE (*) NEGATIVE   Bilirubin Urine  NEGATIVE  NEGATIVE   Ketones, ur 15 (*) NEGATIVE mg/dL   Protein, ur NEGATIVE  NEGATIVE mg/dL   Urobilinogen, UA 0.2  0.0 - 1.0 mg/dL   Nitrite NEGATIVE  NEGATIVE   Leukocytes, UA MODERATE (*) NEGATIVE  URINE MICROSCOPIC-ADD ON     Status: Abnormal   Collection Time    11/08/12  4:02 AM      Result Value Range   Squamous Epithelial / LPF FEW (*) RARE   WBC, UA TOO NUMEROUS TO COUNT  <3 WBC/hpf   RBC / HPF 0-2  <3 RBC/hpf   Bacteria, UA RARE  RARE  PROTIME-INR     Status: Abnormal   Collection Time    11/08/12  8:09 AM      Result Value Range   Prothrombin Time 20.6 (*) 11.6 - 15.2 seconds   INR 1.84 (*) 0.00 - 1.49    Intake/Output Summary (Last 24 hours) at 11/08/12 0950 Last data filed at 11/08/12 0915  Gross per 24 hour  Intake    550 ml  Output    700 ml  Net   -150 ml    ASSESSMENT AND PLAN:  Pericardial effusion:  She is to have a window now.  We will then discuss the timing or resuming systemic anticoagulation.   Atrial fibrillation:  PAF.  We will treat if it recurs.    Antiphospholipid antibody positive:  We will consider the timing of restarting anticoagulation as above.  She is high risk for recurrent DVT given her past history.  There might be a role for IVC filter.   HTN:  For now she will continue her previous medications.  BP is normal despite large effusion.    [name] 11/08/2012 9:50 AM

## 2012-11-08 NOTE — Transfer of Care (Signed)
Immediate Anesthesia Transfer of Care Note  Patient: [name]  Procedure(s) Performed: Procedure(s): SUBXYPHOID PERICARDIAL WINDOW (N/A)  Patient Location: PACU  Anesthesia Type:General  Level of Consciousness: awake, alert  and oriented  Airway & Oxygen Therapy: Patient Spontanous Breathing and Patient connected to face mask oxygen  Post-op Assessment: Report given to PACU RN, Post -op Vital signs reviewed and stable and Patient moving all extremities X 4  Post vital signs: Reviewed and stable  Complications: No apparent anesthesia complications

## 2012-11-08 NOTE — Progress Notes (Addendum)
  Echocardiogram 2D Echocardiogram (stat) has been performed.  [name] 11/08/2012, 10:12 AM

## 2012-11-08 NOTE — Op Note (Signed)
CARDIOTHORACIC SURGERY OPERATIVE NOTE  Date of Procedure:   11/08/2012  Preoperative Diagnosis:  Pericardial Effusion   Postoperative Diagnosis:  same  Procedure:    Subxyphoid Pericardial Window  Surgeon:    Salvatore Decent. 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 The patient notes that she has had some exertional shortness of breath and fatigue that has been gradually progressive for quite some time. However, 2 weeks ago the patient suffered a fall at work resulting in significant injury with fracture of her right wrist and clavicle. She states that time she fell forward and quadricep with her right arm and also landed on her chest and hit her head. She stayed out of work for a couple of weeks but recently had tried to return to light duty. At that time she was noted to have increased pain in her right shoulder, and it was only than the fracture of her right clavicle was discovered. Several days ago she began to develop shortness of breath. Over the last 3 days for shortness of breath has progressed fairly acutely ultimately causing her to present to the emergency department yesterday evening with severe resting shortness of breath and hypoxemia. CT scan of the chest was performed demonstrating large pericardial effusion with evidence suggestive of compression of the right ventricle. The patient was seen by cardiology and at that time noted to be supratherapeutic on Coumadin with INR 3.8. Vitamin K was ordered last  night but apparently not given until just a few minutes ago. Cardiothoracic surgical consultation has been requested. The patient has been seen in consultation and counseled at length regarding the indications, risks and potential benefits of surgery.  All questions have been answered, and the patient provides full informed consent for the operation as described.      DETAILS OF THE OPERATIVE PROCEDURE  The patient is brought to the operating room on the above mentioned date and placed in the supine position on the operating table. A radial arterial line and central venous catheter placed by the anesthesia team. Intravenous antibiotics are administered. Pneumatic sequential compression boots are placed on both lower extremities. General endotracheal anesthesia is induced uneventfully. A Foley catheter is placed. The patient's anterior chest abdomen and both groins are prepared and draped in a sterile manner.  A time out procedure is performed. A small vertical incision is made in the midline beginning at the xiphoid process and extending towards the umbilicus. The incision is completed through the subcutaneous tissue and linea alba using electrocautery. The left body of rectus abdominis muscle is retracted in a cephalad direction and the subjacent pre-peritoneal fat and fascia retracted inferiorly until the anterior surface of the pericardium was exposed. A small incision is made in the pericardium. A large amount of grossly bloody fluid is immediately evacuated. Portions of the fluid are trapped to be sent for routine laboratory data, culture, and cytology. A total of approximately 400 mL of fluid is evacuated. The pericardial space is drained with a single 28 French Bard chest  tube placed through a separate stab incision. The abdominal fascia is closed in the midline using interrupted #1 Vicryl suture. Subcutaneous tissues are closed in layers and the skin is closed with a running subcuticular skin  closure.  The patient tolerated the procedure well, was extubated in the operating room, and transported to the postanesthesia care unit in stable condition. Estimated blood loss was trivial. There are no intraoperative complications. All sponge instrument and needle counts are verified correct at completion the operation.     Salvatore Decent. Cornelius Moras MD 11/08/2012 12:24 PM

## 2012-11-09 LAB — PREPARE FRESH FROZEN PLASMA
Unit division: 0
Unit division: 0

## 2012-11-09 LAB — URINE CULTURE: Colony Count: >=100000

## 2012-11-09 LAB — BASIC METABOLIC PANEL
Chloride: 102 mEq/L (ref 96–112)
GFR calc Af Amer: >90 mL/min (ref >90)
Potassium: 2.9 mEq/L — ABNORMAL LOW (ref 3.5–5.1)

## 2012-11-09 LAB — CBC
Platelets: 416 10*3/uL — ABNORMAL HIGH (ref 150–400)
RBC: 3.54 MIL/uL — ABNORMAL LOW (ref 3.87–5.11)
RDW: 15.3 % (ref 11.5–15.5)
WBC: 16.8 10*3/uL — ABNORMAL HIGH (ref 4.0–10.5)

## 2012-11-09 MED ORDER — POTASSIUM CHLORIDE CRYS ER 20 MEQ PO TBCR
40.0000 meq | EXTENDED_RELEASE_TABLET | Freq: Once | ORAL | Status: AC
  Administered 2012-11-09: 40 meq via ORAL

## 2012-11-09 MED ORDER — POTASSIUM CHLORIDE CRYS ER 20 MEQ PO TBCR
40.0000 meq | EXTENDED_RELEASE_TABLET | Freq: Once | ORAL | Status: AC
  Administered 2012-11-09: 40 meq via ORAL
  Filled 2012-11-09: qty 2

## 2012-11-09 MED ORDER — SODIUM CHLORIDE 0.9 % IJ SOLN
10.0000 mL | INTRAMUSCULAR | Status: DC | PRN
  Filled 2012-11-09: qty 10

## 2012-11-09 MED ORDER — NALOXONE HCL 0.4 MG/ML IJ SOLN: 0.4000 mg | INTRAMUSCULAR | Status: DC | PRN

## 2012-11-09 MED ORDER — DIPHENHYDRAMINE HCL 50 MG/ML IJ SOLN: 12.5000 mg | Freq: Four times a day (QID) | INTRAMUSCULAR | Status: DC | PRN

## 2012-11-09 MED ORDER — DIPHENHYDRAMINE HCL 12.5 MG/5ML PO ELIX
12.5000 mg | ORAL_SOLUTION | Freq: Four times a day (QID) | ORAL | Status: DC | PRN
  Filled 2012-11-09: qty 5

## 2012-11-09 MED ORDER — MIDAZOLAM HCL 2 MG/2ML IJ SOLN: 1.0000 mg | Freq: Once | INTRAMUSCULAR | Status: AC | PRN

## 2012-11-09 MED ORDER — CHLORHEXIDINE GLUCONATE CLOTH 2 % EX PADS
6.0000 | MEDICATED_PAD | Freq: Every day | CUTANEOUS | Status: DC
  Administered 2012-11-11 – 2012-11-14 (×4): 6 via TOPICAL

## 2012-11-09 MED ORDER — HYDROMORPHONE 0.3 MG/ML IV SOLN
INTRAVENOUS | Status: AC
  Filled 2012-11-09: qty 25

## 2012-11-09 MED ORDER — SODIUM CHLORIDE 0.9 % IJ SOLN: 9.0000 mL | INTRAMUSCULAR | Status: DC | PRN

## 2012-11-09 MED ORDER — SODIUM CHLORIDE 0.9 % IJ SOLN
INTRAMUSCULAR | Status: AC
  Filled 2012-11-09: qty 20

## 2012-11-09 MED ORDER — INSULIN ASPART 100 UNIT/ML ~~LOC~~ SOLN: 0.0000 [IU] | Freq: Four times a day (QID) | SUBCUTANEOUS | Status: DC

## 2012-11-09 MED ORDER — POTASSIUM CHLORIDE CRYS ER 20 MEQ PO TBCR
EXTENDED_RELEASE_TABLET | ORAL | Status: AC
  Administered 2012-11-09: 40 meq
  Filled 2012-11-09: qty 2

## 2012-11-09 MED ORDER — MUPIROCIN 2 % EX OINT
1.0000 "application " | TOPICAL_OINTMENT | Freq: Two times a day (BID) | CUTANEOUS | Status: AC
  Administered 2012-11-09 – 2012-11-14 (×10): 1 via NASAL
  Filled 2012-11-09: qty 22

## 2012-11-09 MED ORDER — ONDANSETRON HCL 4 MG/2ML IJ SOLN: 4.0000 mg | Freq: Four times a day (QID) | INTRAMUSCULAR | Status: DC | PRN

## 2012-11-09 MED ORDER — OXYCODONE HCL 5 MG PO TABS
5.0000 mg | ORAL_TABLET | ORAL | Status: DC | PRN
  Administered 2012-11-09 – 2012-11-14 (×9): 5 mg via ORAL
  Filled 2012-11-09 (×9): qty 1

## 2012-11-09 MED ORDER — SODIUM CHLORIDE 0.9 % IJ SOLN
10.0000 mL | Freq: Two times a day (BID) | INTRAMUSCULAR | Status: DC
  Administered 2012-11-09 – 2012-11-12 (×5): 10 mL via INTRAVENOUS
  Filled 2012-11-09 (×3): qty 10

## 2012-11-09 MED ORDER — SODIUM CHLORIDE 0.9 % IJ SOLN
10.0000 mL | Freq: Two times a day (BID) | INTRAMUSCULAR | Status: DC
  Administered 2012-11-10 – 2012-11-14 (×5): 10 mL via INTRAVENOUS
  Filled 2012-11-09: qty 10
  Filled 2012-11-09: qty 20

## 2012-11-09 MED ORDER — HYDROMORPHONE 0.3 MG/ML IV SOLN
INTRAVENOUS | Status: DC
  Administered 2012-11-09: 0.9 mg via INTRAVENOUS
  Administered 2012-11-09: 1.83 mg via INTRAVENOUS
  Administered 2012-11-09: 13:00:00 via INTRAVENOUS
  Administered 2012-11-10: 1.5 mg via INTRAVENOUS
  Administered 2012-11-10: 0.9 mg via INTRAVENOUS
  Administered 2012-11-10: 1.5 mg via INTRAVENOUS
  Administered 2012-11-10: 08:00:00 via INTRAVENOUS
  Administered 2012-11-10: 0.9 mg via INTRAVENOUS
  Administered 2012-11-11: 1.5 mg via INTRAVENOUS
  Administered 2012-11-11: 0.6 mg via INTRAVENOUS
  Administered 2012-11-11: 0.3 mg via INTRAVENOUS
  Administered 2012-11-11: 2.36 mg via INTRAVENOUS
  Administered 2012-11-11: 1.5 mg via INTRAVENOUS
  Administered 2012-11-12 (×2): 0.3 mg via INTRAVENOUS
  Administered 2012-11-12: 0.6 mg via INTRAVENOUS
  Administered 2012-11-12: 0.3 mg via INTRAVENOUS
  Filled 2012-11-09 (×2): qty 25

## 2012-11-09 NOTE — Progress Notes (Addendum)
[facility] DAILY PROGRESS NOTE                   [address]            [place] [address]          [phone]      1 Day Post-Op Procedure(s) (LRB): SUBXYPHOID PERICARDIAL WINDOW (N/A)  Total Length of Stay:  LOS: 2 days   Subjective: Feels ok, wants to change to dilaudid from fentanyl pca  Objective: Vital signs in last 24 hours: Temp:  [97.6 F (36.4 C)-99 F (37.2 C)] 98.2 F (36.8 C) (04/05 1110) Pulse Rate:  [34-103] 84 (04/05 0958) Cardiac Rhythm:  [-] Normal sinus rhythm (04/05 0958) Resp:  [9-24] 19 (04/05 0958) BP: (102-156)/(61-83) 127/64 mmHg (04/05 0958) SpO2:  [83 %-100 %] 100 % (04/05 0958) Arterial Line BP: (93-155)/(53-93) 155/93 mmHg (04/05 0958) FiO2 (%):  [100 %] 100 % (04/05 0800) Weight:  [187 lb 2.7 oz (84.9 kg)] 187 lb 2.7 oz (84.9 kg) (04/05 0500)  Filed Weights   11/08/12 0217 11/09/12 0500  Weight: 184 lb 1.4 oz (83.5 kg) 187 lb 2.7 oz (84.9 kg)    Weight change: 3 lb 1.4 oz (1.4 kg)   Hemodynamic parameters for last 24 hours:    Intake/Output from previous day: 04/04 0701 - 04/05 0700 In: 2875 [I.V.:2025; Blood:600; IV Piggyback:250] Out: 2730 [Urine:2450; Blood:200; Chest Tube:80]  Intake/Output this shift: Total I/O In: -  Out: 350 [Urine:350]  Current Meds: Scheduled Meds: . acetaminophen  1,000 mg Intravenous Q6H  . antiseptic oral rinse  15 mL Mouth Rinse BID  . bisacodyl  10 mg Oral Daily  . calcium-vitamin D  1 tablet Oral Q breakfast  . esomeprazole  40 mg Oral BID  . fentaNYL   Intravenous Q4H  . fluticasone  1 spray Each Nare Daily  . gabapentin  400 mg Oral BID  . insulin aspart  0-24 Units Subcutaneous Q4H  . loteprednol  1 drop Both Eyes TID AC & HS  . potassium chloride SA      . potassium chloride  40 mEq Oral Once  . progesterone  100 mg Oral QHS  . rifaximin  550 mg Oral Daily  . sodium chloride  10 mL Intravenous Q12H  . sodium chloride  10 mL Intravenous Q12H  . sodium chloride        Continuous Infusions:  PRN Meds:.sodium chloride, cyclobenzaprine, diphenhydrAMINE, diphenhydrAMINE, ipratropium, LORazepam, naloxone, ondansetron (ZOFRAN) IV, ondansetron (ZOFRAN) IV, ondansetron (ZOFRAN) IV, oxyCODONE, sodium chloride, sodium chloride  General appearance: alert, cooperative and no distress Heart: regular rate and rhythm Lungs: sl coarse and dim in bases Wound: dressing intact  Lab Results: CBC: Recent Labs  11/08/12 0230 11/09/12 0500  WBC 10.5 16.8*  HGB 10.3* 10.4*  HCT 30.5* 31.2*  PLT 440* 416*   BMET:  Recent Labs  11/08/12 0230 11/09/12 0500  NA 137 135  K 3.1* 2.9*  CL 103 102  CO2 23 21  GLUCOSE 110* 107*  BUN 10 6  CREATININE 0.68 0.52  CALCIUM 9.6 9.2    PT/INR:  Recent Labs  11/08/12 0809  LABPROT 20.6*  INR 1.84*   Radiology: Dg Chest 2 View  11/07/2012  *RADIOLOGY REPORT*  Clinical Data: Short of breath  CHEST - 2 VIEW  Comparison: 09/03/2012  Findings: Borderline cardiomegaly.  Small pleural effusions.  Mild vascular congestion.  Subsegmental atelectasis at the left base. No pneumothorax.  IMPRESSION: Borderline cardiomegaly  with vascular congestion and small pleural effusions.   Original Report Authenticated By: Jolaine Click, M.D.    Ct Angio Chest Pe W/cm &/or Wo Cm  11/07/2012  *RADIOLOGY REPORT*  Clinical Data: Severe shortness of breath with exertion.  Recent wrist and clavicular fractures.  CT ANGIOGRAPHY CHEST  Technique:  Multidetector CT imaging of the chest using the standard protocol during bolus administration of intravenous contrast. Multiplanar reconstructed images including MIPs were obtained and reviewed to evaluate the vascular anatomy.  Contrast: OMNIPAQUE IOHEXOL 350 MG/ML SOLN  Comparison: 08/30/2010  Findings: Heterogeneous enlargement of the left lobe of the thyroid extending into the substernal space and displacing the trachea towards the right.  Mass measures about 4.3 x 5.1 cm.  There is some calcification.   The appearance is stable since previous study.  Technically adequate study with good opacification of the central and segmental pulmonary arteries.  No focal filling defects demonstrated.  No evidence of significant pulmonary embolus.  There is interval development of a large pericardial effusion which appears loculated towards the right anteriorly. Density measurements are increased, suggesting that this might be hemorrhagic or infected.  The effusion causes constriction of the right ventricle.  Correlate for clinical signs of cardiac tamponade.  There is reflux of contrast material into the inferior vena cava.  New bilateral pleural effusions, greater on the right, with atelectasis or consolidation in the lung bases.  The esophagus is distended and filled with fluid, gas, and heterogeneous material consistent with food.  This may represent esophageal dysmotility or achalasia.  Scattered lymph nodes in the mediastinum and hilar regions are not pathologically enlarged.  Airways appear patent. No pneumothorax.  Mild degenerative changes of the thoracic spine.  IMPRESSION: No evidence of significant pulmonary embolus.  Large pericardial effusion appears loculated in the right anteriorly and causing constriction of the right ventricle.  Bilateral pleural effusions, greater on the right, with atelectasis in both lung bases. Esophageal dilatation suggesting achalasia or dysmotility.  Stable large left thyroid goiter.  Results were discussed by telephone with Dr. Bernette Mayers at 2127 hours on 11/07/2012.   Original Report Authenticated By: Burman Nieves, M.D.    Dg Chest Portable 1 View  11/08/2012  *RADIOLOGY REPORT*  Clinical Data: Postop cardiotomy  PORTABLE CHEST - 1 VIEW  Comparison: Chest radiograph 11/07/2012  Findings: There is a right central venous line with tip in the distal SVC.  Stable cardiac silhouette.  There is mild atelectasis in the left lung.  No pulmonary edema.  No pneumothorax.  IMPRESSION:  1.   Right central venous line in proper position. 2.  Mild cardiomegaly.  3.  Mild left atelectasis.   Original Report Authenticated By: Genevive Bi, M.D.      Assessment/Plan: S/P Procedure(s) (LRB): SUBXYPHOID PERICARDIAL WINDOW (N/A)  1 drain 80 cc , will leave till repeat echo done- ordered for today 2 change to dilaudid pca     GOLD,WAYNE E 11/09/2012 11:24 AM patient examined and medical record reviewed,agree with above note. Pericardial fluid neg for organisms, few WBCs- path pending VAN TRIGT III,Ermelinda Eckert 11/09/2012

## 2012-11-09 NOTE — Progress Notes (Signed)
Patient ID: Marni Franzoni, female   DOB: 26-Jul-1956, 57 y.o.   MRN: [id number]    SUBJECTIVE: Feels better this morning.  Good oxygen saturation on nasal cannula.  BP stable.   Marland Kitchen acetaminophen  1,000 mg Intravenous Q6H  . antiseptic oral rinse  15 mL Mouth Rinse BID  . bisacodyl  10 mg Oral Daily  . calcium-vitamin D  1 tablet Oral Q breakfast  . esomeprazole  40 mg Oral BID  . fentaNYL   Intravenous Q4H  . fluticasone  1 spray Each Nare Daily  . gabapentin  400 mg Oral BID  . insulin aspart  0-24 Units Subcutaneous Q4H  . loteprednol  1 drop Both Eyes TID AC & HS  . potassium chloride  40 mEq Oral Once  . potassium chloride  40 mEq Oral Once  . progesterone  100 mg Oral QHS  . rifaximin  550 mg Oral Daily  . sodium chloride  10 mL Intravenous Q12H  . sodium chloride  10 mL Intravenous Q12H  . sodium chloride          Filed Vitals:   11/09/12 0500 11/09/12 0750 11/09/12 0800 11/09/12 0958  BP:    127/64  Pulse:   80 84  Temp:  99 F (37.2 C)    TempSrc:  Oral    Resp:   14 19  Height:      Weight: 187 lb 2.7 oz (84.9 kg)     SpO2:   99% 100%    Intake/Output Summary (Last 24 hours) at 11/09/12 1019 Last data filed at 11/09/12 0750  Gross per 24 hour  Intake   1775 ml  Output   2955 ml  Net  -1180 ml    LABS: Basic Metabolic Panel:  Recent Labs  40/98/11 0230 11/09/12 0500  NA 137 135  K 3.1* 2.9*  CL 103 102  CO2 23 21  GLUCOSE 110* 107*  BUN 10 6  CREATININE 0.68 0.52  CALCIUM 9.6 9.2   Liver Function Tests:  Recent Labs  11/08/12 0230  AST 15  ALT 23  ALKPHOS 142*  BILITOT 0.3  PROT 6.6  ALBUMIN 3.1*   No results found for this basename: LIPASE, AMYLASE,  in the last 72 hours CBC:  Recent Labs  11/08/12 0230 11/09/12 0500  WBC 10.5 16.8*  HGB 10.3* 10.4*  HCT 30.5* 31.2*  MCV 86.9 88.1  PLT 440* 416*   Cardiac Enzymes: No results found for this basename: CKTOTAL, CKMB, CKMBINDEX, TROPONINI,  in the last 72 hours BNP: No  components found with this basename: POCBNP,  D-Dimer: No results found for this basename: DDIMER,  in the last 72 hours Hemoglobin A1C: No results found for this basename: HGBA1C,  in the last 72 hours Fasting Lipid Panel: No results found for this basename: CHOL, HDL, LDLCALC, TRIG, CHOLHDL, LDLDIRECT,  in the last 72 hours Thyroid Function Tests:  Recent Labs  11/08/12 0230  TSH 0.907   Anemia Panel: No results found for this basename: VITAMINB12, FOLATE, FERRITIN, TIBC, IRON, RETICCTPCT,  in the last 72 hours  RADIOLOGY: Dg Chest 2 View  11/07/2012  *RADIOLOGY REPORT*  Clinical Data: Short of breath  CHEST - 2 VIEW  Comparison: 09/03/2012  Findings: Borderline cardiomegaly.  Small pleural effusions.  Mild vascular congestion.  Subsegmental atelectasis at the left base. No pneumothorax.  IMPRESSION: Borderline cardiomegaly with vascular congestion and small pleural effusions.   Original Report Authenticated By: Jolaine Click, M.D.  Ct Angio Chest Pe W/cm &/or Wo Cm  11/07/2012  *RADIOLOGY REPORT*  Clinical Data: Severe shortness of breath with exertion.  Recent wrist and clavicular fractures.  CT ANGIOGRAPHY CHEST  Technique:  Multidetector CT imaging of the chest using the standard protocol during bolus administration of intravenous contrast. Multiplanar reconstructed images including MIPs were obtained and reviewed to evaluate the vascular anatomy.  Contrast: OMNIPAQUE IOHEXOL 350 MG/ML SOLN  Comparison: 08/30/2010  Findings: Heterogeneous enlargement of the left lobe of the thyroid extending into the substernal space and displacing the trachea towards the right.  Mass measures about 4.3 x 5.1 cm.  There is some calcification.  The appearance is stable since previous study.  Technically adequate study with good opacification of the central and segmental pulmonary arteries.  No focal filling defects demonstrated.  No evidence of significant pulmonary embolus.  There is interval  development of a large pericardial effusion which appears loculated towards the right anteriorly. Density measurements are increased, suggesting that this might be hemorrhagic or infected.  The effusion causes constriction of the right ventricle.  Correlate for clinical signs of cardiac tamponade.  There is reflux of contrast material into the inferior vena cava.  New bilateral pleural effusions, greater on the right, with atelectasis or consolidation in the lung bases.  The esophagus is distended and filled with fluid, gas, and heterogeneous material consistent with food.  This may represent esophageal dysmotility or achalasia.  Scattered lymph nodes in the mediastinum and hilar regions are not pathologically enlarged.  Airways appear patent. No pneumothorax.  Mild degenerative changes of the thoracic spine.  IMPRESSION: No evidence of significant pulmonary embolus.  Large pericardial effusion appears loculated in the right anteriorly and causing constriction of the right ventricle.  Bilateral pleural effusions, greater on the right, with atelectasis in both lung bases. Esophageal dilatation suggesting achalasia or dysmotility.  Stable large left thyroid goiter.  Results were discussed by telephone with Dr. Bernette Mayers at 2127 hours on 11/07/2012.   Original Report Authenticated By: Burman Nieves, M.D.    Dg Chest Portable 1 View  11/08/2012  *RADIOLOGY REPORT*  Clinical Data: Postop cardiotomy  PORTABLE CHEST - 1 VIEW  Comparison: Chest radiograph 11/07/2012  Findings: There is a right central venous line with tip in the distal SVC.  Stable cardiac silhouette.  There is mild atelectasis in the left lung.  No pulmonary edema.  No pneumothorax.  IMPRESSION:  1.  Right central venous line in proper position. 2.  Mild cardiomegaly.  3.  Mild left atelectasis.   Original Report Authenticated By: Genevive Bi, M.D.    Dg Knee Complete 4 Views Right  10/10/2012  *RADIOLOGY REPORT*  Clinical Data: Fall.  Pain  RIGHT  KNEE - COMPLETE 4+ VIEW  Comparison: None.  Findings: Normal alignment and no fracture.  No joint effusion. Mild degenerative change in the patella.  Small calcification anterior to the proximal tibia most likely vascular.  IMPRESSION: Negative for fracture.   Original Report Authenticated By: Janeece Riggers, M.D.     PHYSICAL EXAM General: NAD Neck: JVP 10-12 cm, no thyromegaly or thyroid nodule.  Lungs: Clear to auscultation bilaterally with normal respiratory effort. CV: Nondisplaced PMI.  Heart regular S1/S2, no S3/S4, no murmur.  1+ edema 1/2 to knee on right (chronic).  No carotid bruit.  Normal pedal pulses.  Abdomen: Soft, nontender, no hepatosplenomegaly, no distention.  Neurologic: Alert and oriented x 3.  Psych: Normal affect. Extremities: No clubbing or cyanosis.   TELEMETRY: Reviewed  telemetry pt in NSR  ASSESSMENT AND PLAN: 57 yo with history of CREST syndrome, APLAS with prior DVTs, and paroxysmal atrial fibrillation presented with dyspnea and was found to have pericardial tamponade.  She is now s/p pericardial window. 1. Pericardial tamponade: Now s/p window.  Hemorrhagic pericardial effusion in the setting of chest trauma (recent fall on chest) and INR > 3.  Question now will be management of anticoagulation.  She has APLAS with history of DVTs as well as PAF and is at significant risk for thrombotic events.  She is somewhat protected now with pericardial window.  Per last CVTS note, would like to wait 48 hrs for systemic anticoagulation.  Would consider restarting coumadin tonight, goal INR 2.5-3 rather than 2.5-3.5.  Woud favor at least prophylactic heparin subcutaneously.  Not sure she would get much benefit from addition of IVC filter if we are to continue coumadin (which I think she should get as she is at risk for both arterial and venous thrombi with APLAS as well as LA thrombi with PAF).  2. CHF: Mildly decreased LV function but difficult to assess LV EF on last echo with  tamponade.  Will repeat limited echo for LV function.  JVP elevated, would stop IV fluid now that she is taking po.  Marca Ancona 11/09/2012 10:29 AM

## 2012-11-10 ENCOUNTER — Inpatient Hospital Stay (HOSPITAL_COMMUNITY): Payer: 59

## 2012-11-10 DIAGNOSIS — Z7901 Long term (current) use of anticoagulants: Secondary | ICD-10-CM

## 2012-11-10 LAB — CBC
HCT: 28.5 % — ABNORMAL LOW (ref 36.0–46.0)
Hemoglobin: 9.4 g/dL — ABNORMAL LOW (ref 12.0–15.0)
MCV: 88.8 fL (ref 78.0–100.0)
WBC: 14.5 10*3/uL — ABNORMAL HIGH (ref 4.0–10.5)

## 2012-11-10 LAB — COMPREHENSIVE METABOLIC PANEL
Alkaline Phosphatase: 130 U/L — ABNORMAL HIGH (ref 39–117)
BUN: 6 mg/dL (ref 6–23)
CO2: 26 mEq/L (ref 19–32)
Chloride: 101 mEq/L (ref 96–112)
GFR calc Af Amer: >90 mL/min (ref >90)
GFR calc non Af Amer: >90 mL/min (ref >90)
Glucose, Bld: 109 mg/dL — ABNORMAL HIGH (ref 70–99)
Potassium: 3.3 mEq/L — ABNORMAL LOW (ref 3.5–5.1)
Total Bilirubin: 0.6 mg/dL (ref 0.3–1.2)

## 2012-11-10 MED ORDER — DOCUSATE SODIUM 100 MG PO CAPS: 100.0000 mg | ORAL_CAPSULE | Freq: Two times a day (BID) | ORAL | Status: DC | PRN

## 2012-11-10 MED ORDER — WARFARIN - PHARMACIST DOSING INPATIENT
Freq: Every day | Status: DC
  Administered 2012-11-13: 18:00:00

## 2012-11-10 MED ORDER — FUROSEMIDE 10 MG/ML IJ SOLN
40.0000 mg | Freq: Once | INTRAMUSCULAR | Status: AC
  Administered 2012-11-10: 40 mg via INTRAVENOUS
  Filled 2012-11-10: qty 4

## 2012-11-10 MED ORDER — ENOXAPARIN SODIUM 40 MG/0.4ML ~~LOC~~ SOLN
40.0000 mg | SUBCUTANEOUS | Status: DC
  Administered 2012-11-10 – 2012-11-14 (×4): 40 mg via SUBCUTANEOUS
  Filled 2012-11-10 (×6): qty 0.4

## 2012-11-10 MED ORDER — BISMUTH SUBSALICYLATE 262 MG PO CHEW
524.0000 mg | CHEWABLE_TABLET | ORAL | Status: DC | PRN
  Administered 2012-11-10: 524 mg via ORAL
  Filled 2012-11-10: qty 2

## 2012-11-10 MED ORDER — POTASSIUM CHLORIDE CRYS ER 20 MEQ PO TBCR
40.0000 meq | EXTENDED_RELEASE_TABLET | Freq: Once | ORAL | Status: AC
  Administered 2012-11-10: 40 meq via ORAL

## 2012-11-10 MED ORDER — POTASSIUM CHLORIDE CRYS ER 20 MEQ PO TBCR
40.0000 meq | EXTENDED_RELEASE_TABLET | Freq: Once | ORAL | Status: AC
  Administered 2012-11-10: 40 meq via ORAL
  Filled 2012-11-10 (×2): qty 2

## 2012-11-10 MED ORDER — WARFARIN SODIUM 5 MG PO TABS
5.0000 mg | ORAL_TABLET | Freq: Once | ORAL | Status: AC
  Administered 2012-11-10: 5 mg via ORAL
  Filled 2012-11-10: qty 1

## 2012-11-10 NOTE — Progress Notes (Signed)
ANTICOAGULATION CONSULT NOTE - Initial Consult  Pharmacy Consult for coumadin Indication: atrial fibrillation  Allergies  Allergen Reactions  . Codeine Nausea And Vomiting  . Demerol   . Ultram (Tramadol Hcl) Other (See Comments)    Makes her hands shake.    Patient Measurements: Height: 5\' 6"  (167.6 cm) Weight: 187 lb 6.3 oz (85 kg) IBW/kg (Calculated) : 59.3  Vital Signs: Temp: 98.8 F (37.1 C) (04/06 0800) Temp src: Oral (04/06 0800) BP: 116/60 mmHg (04/06 0452) Pulse Rate: 88 (04/06 0452)  Labs:  Recent Labs  11/07/12 1828 11/08/12 0230 11/08/12 0809 11/09/12 0500 11/10/12 0459  HGB 10.7* 10.3*  --  10.4* 9.4*  HCT 31.9* 30.5*  --  31.2* 28.5*  PLT 388 440*  --  416* 390  APTT  --  60*  --   --   --   LABPROT 35.4* 33.4* 20.6*  --   --   INR 3.83* 3.54* 1.84*  --   --   CREATININE 0.64 0.68  --  0.52 0.57    Estimated Creatinine Clearance: 84.2 ml/min (by C-G formula based on Cr of 0.57).   Medical History: Past Medical History  Diagnosis Date  . DVT (deep venous thrombosis)   . CREST syndrome   . Pericardial effusion   . MVP (mitral valve prolapse)   . Antiphospholipid antibody syndrome   . ITP (idiopathic thrombocytopenic purpura)   . Raynaud's phenomenon   . Hypertension   . Paroxysmal atrial fibrillation   . Atrial fibrillation 09/18/2011  . Antiphospholipid antibody positive 10/10/2012  . Hemorrhagic pericardial effusion 11/07/2012  . CREST syndrome 11/08/2012  . 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 Calcium Carbonate-Vitamin D (CALCIUM + D PO) Take 1 tablet by mouth daily.      . CYCLOBENZAPRINE HCL PO Take 1 tablet by mouth at bedtime as needed (muscle spasms).      Marland Kitchen esomeprazole (NEXIUM) 40 MG capsule Take 40 mg by mouth 2 (two) times daily.      Marland Kitchen gabapentin (NEURONTIN) 100 MG capsule Take 400 mg by mouth 2 (two) times daily.       Marland Kitchen  HYDROcodone-acetaminophen (NORCO/VICODIN) 5-325 MG per tablet Take 1-2 tablets by mouth every 6 (six) hours as needed for pain.      Marland Kitchen ipratropium (ATROVENT) 0.06 % nasal spray Place 2 sprays into the nose 4 (four) times daily as needed for rhinitis.      Marland Kitchen losartan (COZAAR) 50 MG tablet Take 50 mg by mouth at bedtime.       Marland Kitchen loteprednol (ALREX) 0.2 % SUSP Place 1 drop into both eyes 4 (four) times daily.      . metoprolol tartrate (LOPRESSOR) 25 MG tablet Take 25 mg by mouth 4 (four) times daily as needed. May take up to 4 times a day as needed for palps      . mometasone (NASONEX) 50 MCG/ACT nasal spray Place 2 sprays into the nose every morning.      Marland Kitchen NIFEdipine (PROCARDIA-XL/ADALAT CC) 30 MG 24 hr tablet Take 30 mg by mouth 2 (two) times daily.       Bertram Gala Glycol-Propyl Glycol (SYSTANE ULTRA OP) Apply 1 drop to eye 4 (four) times daily as needed (dry eyes).      . progesterone (PROMETRIUM) 100 MG capsule Take 100 mg by mouth at bedtime.       . rifaximin (XIFAXAN) 550  MG TABS Take 550 mg by mouth daily.      . Risedronate Sodium (ACTONEL PO) Take 1 tablet by mouth every 30 (thirty) days.       Scheduled:  . [COMPLETED] acetaminophen  1,000 mg Intravenous Q6H  . antiseptic oral rinse  15 mL Mouth Rinse BID  . bisacodyl  10 mg Oral Daily  . calcium-vitamin D  1 tablet Oral Q breakfast  . Chlorhexidine Gluconate Cloth  6 each Topical Q0600  . esomeprazole  40 mg Oral BID  . fluticasone  1 spray Each Nare Daily  . furosemide  40 mg Intravenous Once  . gabapentin  400 mg Oral BID  . HYDROmorphone PCA 0.3 mg/mL   Intravenous Q4H  . [EXPIRED] HYDROmorphone PCA 0.3 mg/mL      . insulin aspart  0-24 Units Subcutaneous QID  . mupirocin ointment  1 application Nasal BID  . [COMPLETED] potassium chloride  40 mEq Oral Once  . [COMPLETED] potassium chloride  40 mEq Oral Once  . potassium chloride  40 mEq Oral Once  . potassium chloride  40 mEq Oral Once  . progesterone  100 mg Oral QHS   . rifaximin  550 mg Oral Daily  . sodium chloride  10 mL Intravenous Q12H  . sodium chloride  10 mL Intravenous Q12H  . [EXPIRED] sodium chloride      . [DISCONTINUED] fentaNYL   Intravenous Q4H  . [DISCONTINUED] insulin aspart  0-24 Units Subcutaneous Q4H  . [DISCONTINUED] loteprednol  1 drop Both Eyes TID AC & HS    Assessment: 57 yo female here with SOB and found with pericardial tamponade noted s/p pericardial window on 4/4 (noted hemorrhagic pericardial effusion). Patient with h/o positive anti-phospholipid antibody with history of DVTs as well as PAF. INR noted 3.83 on admit and at 1.84 today. Hg/Hct= 9.4/28.5 with plt= 390.  Home coumadin dose: 7.5mg /day except 5mg  on Sun  Goal of Therapy:  INR 2.5- 3.0 (per MD) Monitor platelets by anticoagulation protocol: Yes   Plan:  -Coumadin 5mg  po  -Daily PT/INR  Harland German, Pharm D 11/10/2012 12:48 PM

## 2012-11-10 NOTE — Progress Notes (Addendum)
[facility] DAILY PROGRESS NOTE                   [address]            [place] [address]          [phone]      2 Days Post-Op Procedure(s) (LRB): SUBXYPHOID PERICARDIAL WINDOW (N/A)  Total Length of Stay:  LOS: 3 days   Subjective: Feels better, pain controlled better with dilaudid  Objective: Vital signs in last 24 hours: Temp:  [98.2 F (36.8 C)-99.5 F (37.5 C)] 98.8 F (37.1 C) (04/06 0800) Pulse Rate:  [81-89] 88 (04/06 0452) Cardiac Rhythm:  [-] Normal sinus rhythm (04/06 0452) Resp:  [10-21] 16 (04/06 0452) BP: (104-119)/(60-77) 116/60 mmHg (04/06 0452) SpO2:  [91 %-100 %] 94 % (04/06 0452) Arterial Line BP: (148)/(92) 148/92 mmHg (04/05 1100) FiO2 (%):  [95 %-97 %] 95 % (04/06 0400) Weight:  [187 lb 6.3 oz (85 kg)] 187 lb 6.3 oz (85 kg) (04/06 0500)  Filed Weights   11/08/12 0217 11/09/12 0500 11/10/12 0500  Weight: 184 lb 1.4 oz (83.5 kg) 187 lb 2.7 oz (84.9 kg) 187 lb 6.3 oz (85 kg)    Weight change: 3.5 oz (0.1 kg)   Hemodynamic parameters for last 24 hours:    Intake/Output from previous day: 04/05 0701 - 04/06 0700 In: 2393.9 [P.O.:1340; I.V.:853.9; IV Piggyback:200] Out: 1445 [Urine:1375; Chest Tube:70]  Intake/Output this shift:    Current Meds: Scheduled Meds: . antiseptic oral rinse  15 mL Mouth Rinse BID  . bisacodyl  10 mg Oral Daily  . calcium-vitamin D  1 tablet Oral Q breakfast  . Chlorhexidine Gluconate Cloth  6 each Topical Q0600  . esomeprazole  40 mg Oral BID  . fluticasone  1 spray Each Nare Daily  . gabapentin  400 mg Oral BID  . HYDROmorphone PCA 0.3 mg/mL   Intravenous Q4H  . insulin aspart  0-24 Units Subcutaneous QID  . loteprednol  1 drop Both Eyes TID AC & HS  . mupirocin ointment  1 application Nasal BID  . progesterone  100 mg Oral QHS  . rifaximin  550 mg Oral Daily  . sodium chloride  10 mL Intravenous Q12H  . sodium chloride  10 mL Intravenous Q12H   Continuous Infusions:  PRN Meds:.sodium  chloride, bismuth subsalicylate, cyclobenzaprine, diphenhydrAMINE, diphenhydrAMINE, ipratropium, LORazepam, midazolam, naloxone, ondansetron (ZOFRAN) IV, ondansetron (ZOFRAN) IV, oxyCODONE, sodium chloride, sodium chloride  General appearance: alert, appears stated age and no distress Heart: regular rate and rhythm and no rub Lungs: ddim in lower fields, faint crackles Abdomen: soft, nontender Wound: ok  Lab Results: CBC:  Recent Labs  11/09/12 0500 11/10/12 0459  WBC 16.8* 14.5*  HGB 10.4* 9.4*  HCT 31.2* 28.5*  PLT 416* 390   BMET:   Recent Labs  11/09/12 0500 11/10/12 0459  NA 135 134*  K 2.9* 3.3*  CL 102 101  CO2 21 26  GLUCOSE 107* 109*  BUN 6 6  CREATININE 0.52 0.57  CALCIUM 9.2 9.0    PT/INR:   Recent Labs  11/08/12 0809  LABPROT 20.6*  INR 1.84*   Radiology: Dg Chest 2 View  11/10/2012  *RADIOLOGY REPORT*  Clinical Data: Status post drainage of pericardial effusion.  CHEST - 2 VIEW  Comparison: 11/08/2012  Findings: Pericardial drain remains.  Cardiac and mediastinal contours are stable.  Lungs show increase in right lower lung atelectasis and associated small amount of right  pleural fluid. Left lower lobe atelectasis is stable.  No pulmonary edema or pneumothorax identified.  Right jugular central line positioning stable.  IMPRESSION: Increase in right lower lung atelectasis and associated small right pleural effusion.  Stable left lower lobe atelectasis.   Original Report Authenticated By: Irish Lack, M.D.    Dg Chest Portable 1 View  11/08/2012  *RADIOLOGY REPORT*  Clinical Data: Postop cardiotomy  PORTABLE CHEST - 1 VIEW  Comparison: Chest radiograph 11/07/2012  Findings: There is a right central venous line with tip in the distal SVC.  Stable cardiac silhouette.  There is mild atelectasis in the left lung.  No pulmonary edema.  No pneumothorax.  IMPRESSION:  1.  Right central venous line in proper position. 2.  Mild cardiomegaly.  3.  Mild left  atelectasis.   Original Report Authenticated By: Genevive Bi, M.D.      Assessment/Plan: S/P Procedure(s) (LRB): SUBXYPHOID PERICARDIAL WINDOW (N/A)  1 pericardial drain with small amts serosang drainage. Cardiology restarting coumadin today. Echo pending. Also asking if ok to use SQ lovenox. Poss d/c tube today.  GOLD,WAYNE E 11/10/2012 10:13 AM  leave MT with resuming anticoagulation Coumadin ordered, lovenox 40/day ordered

## 2012-11-10 NOTE — Progress Notes (Signed)
Patient ID: [name], female   DOB: [birth year], 57 y.o.   MRN: [id number]    SUBJECTIVE: Stable this morning.  Still on oxygen by nasal cannula with chest tube in.  Serosanguinous drainage.   Marland Kitchen antiseptic oral rinse  15 mL Mouth Rinse BID  . bisacodyl  10 mg Oral Daily  . calcium-vitamin D  1 tablet Oral Q breakfast  . Chlorhexidine Gluconate Cloth  6 each Topical Q0600  . esomeprazole  40 mg Oral BID  . fluticasone  1 spray Each Nare Daily  . furosemide  40 mg Intravenous Once  . gabapentin  400 mg Oral BID  . HYDROmorphone PCA 0.3 mg/mL   Intravenous Q4H  . insulin aspart  0-24 Units Subcutaneous QID  . mupirocin ointment  1 application Nasal BID  . potassium chloride  40 mEq Oral Once  . potassium chloride  40 mEq Oral Once  . progesterone  100 mg Oral QHS  . rifaximin  550 mg Oral Daily  . sodium chloride  10 mL Intravenous Q12H  . sodium chloride  10 mL Intravenous Q12H      Filed Vitals:   11/10/12 0027 11/10/12 0452 11/10/12 0500 11/10/12 0800  BP: 119/72 116/60    Pulse: 89 88    Temp: 99.4 F (37.4 C) 99.5 F (37.5 C)  98.8 F (37.1 C)  TempSrc: Oral Oral  Oral  Resp: 21 16    Height:      Weight:   187 lb 6.3 oz (85 kg)   SpO2: 94% 94%      Intake/Output Summary (Last 24 hours) at 11/10/12 1017 Last data filed at 11/10/12 0400  Gross per 24 hour  Intake 1516.1 ml  Output   1220 ml  Net  296.1 ml    LABS: Basic Metabolic Panel:  Recent Labs  88/75/79 0500 11/10/12 0459  NA 135 134*  K 2.9* 3.3*  CL 102 101  CO2 21 26  GLUCOSE 107* 109*  BUN 6 6  CREATININE 0.52 0.57  CALCIUM 9.2 9.0   Liver Function Tests:  Recent Labs  11/08/12 0230 11/10/12 0459  AST 15 14  ALT 23 18  ALKPHOS 142* 130*  BILITOT 0.3 0.6  PROT 6.6 5.9*  ALBUMIN 3.1* 2.6*   No results found for this basename: LIPASE, AMYLASE,  in the last 72 hours CBC:  Recent Labs  11/09/12 0500 11/10/12 0459  WBC 16.8* 14.5*  HGB 10.4* 9.4*  HCT 31.2* 28.5*  MCV  88.1 88.8  PLT 416* 390   Cardiac Enzymes: No results found for this basename: CKTOTAL, CKMB, CKMBINDEX, TROPONINI,  in the last 72 hours BNP: No components found with this basename: POCBNP,  D-Dimer: No results found for this basename: DDIMER,  in the last 72 hours Hemoglobin A1C: No results found for this basename: HGBA1C,  in the last 72 hours Fasting Lipid Panel: No results found for this basename: CHOL, HDL, LDLCALC, TRIG, CHOLHDL, LDLDIRECT,  in the last 72 hours Thyroid Function Tests:  Recent Labs  11/08/12 0230  TSH 0.907   Anemia Panel: No results found for this basename: VITAMINB12, FOLATE, FERRITIN, TIBC, IRON, RETICCTPCT,  in the last 72 hours  RADIOLOGY: Dg Chest 2 View  11/07/2012  *RADIOLOGY REPORT*  Clinical Data: Short of breath  CHEST - 2 VIEW  Comparison: 09/03/2012  Findings: Borderline cardiomegaly.  Small pleural effusions.  Mild vascular congestion.  Subsegmental atelectasis at the left base. No pneumothorax.  IMPRESSION: Borderline cardiomegaly with vascular  congestion and small pleural effusions.   Original Report Authenticated By: Jolaine Click, M.D.    Ct Angio Chest Pe W/cm &/or Wo Cm  11/07/2012  *RADIOLOGY REPORT*  Clinical Data: Severe shortness of breath with exertion.  Recent wrist and clavicular fractures.  CT ANGIOGRAPHY CHEST  Technique:  Multidetector CT imaging of the chest using the standard protocol during bolus administration of intravenous contrast. Multiplanar reconstructed images including MIPs were obtained and reviewed to evaluate the vascular anatomy.  Contrast: OMNIPAQUE IOHEXOL 350 MG/ML SOLN  Comparison: 08/30/2010  Findings: Heterogeneous enlargement of the left lobe of the thyroid extending into the substernal space and displacing the trachea towards the right.  Mass measures about 4.3 x 5.1 cm.  There is some calcification.  The appearance is stable since previous study.  Technically adequate study with good opacification of the  central and segmental pulmonary arteries.  No focal filling defects demonstrated.  No evidence of significant pulmonary embolus.  There is interval development of a large pericardial effusion which appears loculated towards the right anteriorly. Density measurements are increased, suggesting that this might be hemorrhagic or infected.  The effusion causes constriction of the right ventricle.  Correlate for clinical signs of cardiac tamponade.  There is reflux of contrast material into the inferior vena cava.  New bilateral pleural effusions, greater on the right, with atelectasis or consolidation in the lung bases.  The esophagus is distended and filled with fluid, gas, and heterogeneous material consistent with food.  This may represent esophageal dysmotility or achalasia.  Scattered lymph nodes in the mediastinum and hilar regions are not pathologically enlarged.  Airways appear patent. No pneumothorax.  Mild degenerative changes of the thoracic spine.  IMPRESSION: No evidence of significant pulmonary embolus.  Large pericardial effusion appears loculated in the right anteriorly and causing constriction of the right ventricle.  Bilateral pleural effusions, greater on the right, with atelectasis in both lung bases. Esophageal dilatation suggesting achalasia or dysmotility.  Stable large left thyroid goiter.  Results were discussed by telephone with Dr. Bernette Mayers at 2127 hours on 11/07/2012.   Original Report Authenticated By: Burman Nieves, M.D.    Dg Chest Portable 1 View  11/08/2012  *RADIOLOGY REPORT*  Clinical Data: Postop cardiotomy  PORTABLE CHEST - 1 VIEW  Comparison: Chest radiograph 11/07/2012  Findings: There is a right central venous line with tip in the distal SVC.  Stable cardiac silhouette.  There is mild atelectasis in the left lung.  No pulmonary edema.  No pneumothorax.  IMPRESSION:  1.  Right central venous line in proper position. 2.  Mild cardiomegaly.  3.  Mild left atelectasis.   Original  Report Authenticated By: Genevive Bi, M.D.    Dg Knee Complete 4 Views Right  10/10/2012  *RADIOLOGY REPORT*  Clinical Data: Fall.  Pain  RIGHT KNEE - COMPLETE 4+ VIEW  Comparison: None.  Findings: Normal alignment and no fracture.  No joint effusion. Mild degenerative change in the patella.  Small calcification anterior to the proximal tibia most likely vascular.  IMPRESSION: Negative for fracture.   Original Report Authenticated By: Janeece Riggers, M.D.     PHYSICAL EXAM General: NAD Neck: JVP 10-12 cm, no thyromegaly or thyroid nodule.  Lungs: Clear to auscultation bilaterally with normal respiratory effort. CV: Nondisplaced PMI.  Heart regular S1/S2, no S3/S4, no murmur.  1+ edema 1/2 to knee on right (chronic).  No carotid bruit.  Normal pedal pulses.  Abdomen: Soft, nontender, no hepatosplenomegaly, no distention.  Neurologic: Alert  and oriented x 3.  Psych: Normal affect. Extremities: No clubbing or cyanosis.   TELEMETRY: Reviewed telemetry pt in NSR  ASSESSMENT AND PLAN: 57 yo with history of CREST syndrome, APLAS with prior DVTs, and paroxysmal atrial fibrillation presented with dyspnea and was found to have pericardial tamponade.  She is now s/p pericardial window. 1. Pericardial tamponade: Now s/p window.  Hemorrhagic pericardial effusion in the setting of chest trauma (recent fall on chest) and INR > 3.  Question now will be management of anticoagulation.  She has APLAS with history of DVTs as well as PAF and is at significant risk for thrombotic events.  She is somewhat protected now with pericardial window.  Per last CVTS note, would like to wait 48 hrs for systemic anticoagulation.  Will start coumadin today (as long as CVTS is ok with this), goal INR 2.5-3 rather than 2.5-3.5.  Would favor at least prophylactic heparin subcutaneously => talked to PA Gold who will discuss with Dr. Donata Clay.  Not sure she would get much benefit from addition of IVC filter if we are to continue  coumadin (which I think she should get as she is at risk for both arterial and venous thrombi with APLAS as well as LA thrombi with PAF).  2. CHF: Mildly decreased LV function but difficult to assess LV EF on last echo with tamponade.  Will repeat limited echo for LV function.  JVP elevated, will give a dose of IV Lasix with KCl today.   Marca Ancona 11/10/2012 10:17 AM

## 2012-11-11 ENCOUNTER — Encounter (HOSPITAL_COMMUNITY): Payer: Self-pay | Admitting: Thoracic Surgery (Cardiothoracic Vascular Surgery)

## 2012-11-11 ENCOUNTER — Inpatient Hospital Stay (HOSPITAL_COMMUNITY): Payer: 59

## 2012-11-11 DIAGNOSIS — R0602 Shortness of breath: Secondary | ICD-10-CM

## 2012-11-11 DIAGNOSIS — R918 Other nonspecific abnormal finding of lung field: Secondary | ICD-10-CM

## 2012-11-11 DIAGNOSIS — R894 Abnormal immunological findings in specimens from other organs, systems and tissues: Secondary | ICD-10-CM

## 2012-11-11 DIAGNOSIS — J90 Pleural effusion, not elsewhere classified: Secondary | ICD-10-CM

## 2012-11-11 DIAGNOSIS — I319 Disease of pericardium, unspecified: Secondary | ICD-10-CM

## 2012-11-11 LAB — BASIC METABOLIC PANEL
Calcium: 9.3 mg/dL (ref 8.4–10.5)
GFR calc Af Amer: >90 mL/min (ref >90)
GFR calc non Af Amer: >90 mL/min (ref >90)
Glucose, Bld: 95 mg/dL (ref 70–99)
Sodium: 135 mEq/L (ref 135–145)

## 2012-11-11 LAB — URINALYSIS, ROUTINE W REFLEX MICROSCOPIC
Bilirubin Urine: NEGATIVE
Hgb urine dipstick: NEGATIVE
Ketones, ur: NEGATIVE mg/dL
Nitrite: NEGATIVE
Urobilinogen, UA: 0.2 mg/dL (ref 0.0–1.0)

## 2012-11-11 LAB — SEDIMENTATION RATE: Sed Rate: 55 mm/hr — ABNORMAL HIGH (ref 0–22)

## 2012-11-11 LAB — C4 COMPLEMENT: Complement C4, Body Fluid: 8 mg/dL — ABNORMAL LOW (ref 10–40)

## 2012-11-11 LAB — C3 COMPLEMENT: C3 Complement: 129 mg/dL (ref 90–180)

## 2012-11-11 LAB — PROCALCITONIN: Procalcitonin: <0.1 ng/mL

## 2012-11-11 MED ORDER — VANCOMYCIN HCL IN DEXTROSE 1-5 GM/200ML-% IV SOLN
1000.0000 mg | Freq: Three times a day (TID) | INTRAVENOUS | Status: DC
  Administered 2012-11-11 – 2012-11-14 (×10): 1000 mg via INTRAVENOUS
  Filled 2012-11-11 (×11): qty 200

## 2012-11-11 MED ORDER — ACETAMINOPHEN 325 MG PO TABS
650.0000 mg | ORAL_TABLET | Freq: Four times a day (QID) | ORAL | Status: DC | PRN
  Administered 2012-11-11: 650 mg via ORAL
  Filled 2012-11-11: qty 2

## 2012-11-11 MED ORDER — PIPERACILLIN-TAZOBACTAM 3.375 G IVPB
3.3750 g | Freq: Three times a day (TID) | INTRAVENOUS | Status: DC
  Administered 2012-11-11 – 2012-11-14 (×10): 3.375 g via INTRAVENOUS
  Filled 2012-11-11 (×11): qty 50

## 2012-11-11 MED ORDER — METOCLOPRAMIDE HCL 5 MG PO TABS
5.0000 mg | ORAL_TABLET | Freq: Three times a day (TID) | ORAL | Status: DC
  Administered 2012-11-11 – 2012-11-14 (×11): 5 mg via ORAL
  Filled 2012-11-11 (×16): qty 1

## 2012-11-11 MED ORDER — WARFARIN SODIUM 5 MG PO TABS
5.0000 mg | ORAL_TABLET | Freq: Once | ORAL | Status: AC
  Administered 2012-11-11: 5 mg via ORAL
  Filled 2012-11-11: qty 1

## 2012-11-11 MED ORDER — FUROSEMIDE 40 MG PO TABS
40.0000 mg | ORAL_TABLET | Freq: Every day | ORAL | Status: DC
  Administered 2012-11-11 – 2012-11-14 (×4): 40 mg via ORAL
  Filled 2012-11-11 (×4): qty 1

## 2012-11-11 NOTE — Consult Note (Signed)
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 She was s/p fall at work 2 weeks prior, slipping on an object on the floor. Her INR was briefly subtherapeutic but she was 3.8 on admit. She was saturating in the 80s on arrival and she responded to 4L leading to the ER team to pursue CTA to look for PE. No PE found but a large pericardial loculated effusion was noted on CT compressing the right ventricle. She was admitted to Cards service. Underwent pericardial window on 4/4. On 4/5 had un-eventful day. On 4/6 CXR began to demonstrate pulmonary infiltrates which were treated w/ diuresis. On 4/7 she began to report cough and her CXR demonstrated worsening aeration in spite of diuresis and neg fluid balance. PCCM was asked to see on 4/7 for worsening infiltrates.    Per Dr Dierdre Forth 11/11/12: Patient used to be seen by Dr Norina Buzzard in past. Past year follows with Dr Dierdre Forth. Per his chart review: patient only has scleroderma and not SLE   SIGNIFICANT EVENTS / STUDIES:  Pericardial window 4/4 Right IJ CVL 4/4>>> Echo 4/5>>> ECHO 4/7>>>  LINES / TUBES:   CULTURES: MRSA PCR 4/4: positive  UC 4/4: gp B strep Pericardial fluid 4/4: >>>  ANTIBIOTICS: Rifaximin      PAST MEDICAL HISTORY :  Past Medical History  Diagnosis Date  . DVT (deep venous thrombosis)   . CREST syndrome   . Pericardial effusion   . MVP (mitral valve prolapse)   . Antiphospholipid antibody syndrome   .  ITP (idiopathic thrombocytopenic purpura)   . Raynaud's phenomenon   . Hypertension   . Paroxysmal atrial fibrillation   . Atrial fibrillation 09/18/2011  . Antiphospholipid antibody positive 10/10/2012  . Hemorrhagic pericardial effusion 11/07/2012  . CREST syndrome 11/08/2012  . 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 Splenectomy      ITP  . Uterine ablation    . Tubal ligation     Prior to Admission medications   Medication Sig Start Date End Date Taking? Authorizing Provider  Calcium Carbonate-Vitamin D (CALCIUM + D PO) Take 1 tablet by mouth daily.   Yes Historical Provider, MD  CYCLOBENZAPRINE HCL PO Take 1 tablet by mouth at bedtime as needed (muscle spasms).   Yes Historical Provider, MD  esomeprazole (NEXIUM) 40 MG capsule Take 40 mg by mouth 2 (two) times daily.   Yes Historical Provider, MD  gabapentin (NEURONTIN) 100 MG capsule Take 400 mg by mouth 2 (two) times daily.    Yes Historical Provider, MD  HYDROcodone-acetaminophen (NORCO/VICODIN) 5-325 MG per tablet Take 1-2 tablets by mouth every 6 (six) hours as needed for pain. 10/11/12  Yes Jeanella Craze, NP  ipratropium (ATROVENT) 0.06 % nasal spray Place 2 sprays into the nose 4 (four) times daily as needed for rhinitis.   Yes Historical Provider, MD  losartan (COZAAR) 50 MG tablet Take 50 mg by mouth at bedtime.    Yes Historical  Provider, MD  loteprednol (ALREX) 0.2 % SUSP Place 1 drop into both eyes 4 (four) times daily.   Yes Historical Provider, MD  metoprolol tartrate (LOPRESSOR) 25 MG tablet Take 25 mg by mouth 4 (four) times daily as needed. May take up to 4 times a day as needed for palps 09/18/11  Yes [name], MD  mometasone (NASONEX) 50 MCG/ACT nasal spray Place 2 sprays into the nose every morning.   Yes Historical Provider, MD  NIFEdipine (PROCARDIA-XL/ADALAT CC) 30 MG 24 hr tablet Take 30 mg by mouth 2 (two) times daily.    Yes Historical Provider, MD   Polyethyl Glycol-Propyl Glycol (SYSTANE ULTRA OP) Apply 1 drop to eye 4 (four) times daily as needed (dry eyes).   Yes Historical Provider, MD  progesterone (PROMETRIUM) 100 MG capsule Take 100 mg by mouth at bedtime.    Yes Historical Provider, MD  rifaximin (XIFAXAN) 550 MG TABS Take 550 mg by mouth daily.   Yes Historical Provider, MD  Risedronate Sodium (ACTONEL PO) Take 1 tablet by mouth every 30 (thirty) days.   Yes Historical Provider, MD  warfarin (COUMADIN) 5 MG tablet Take 5 mg by mouth daily. 7.5mg  daily excpet take 7.5mg  on Sunday   Yes Historical Provider, MD   Allergies  Allergen Reactions  . Codeine Nausea And Vomiting  . Demerol   . Ultram (Tramadol Hcl) Other (See Comments)    Makes her hands shake.    FAMILY HISTORY:  Family History  Problem Relation Age of Onset  . Cancer Mother     Lung  . Diabetes Father   . COPD Father   . Hyperlipidemia Father   . Hypertension Father    SOCIAL HISTORY:  reports that she has never smoked. She has never used smokeless tobacco. She reports that she does not drink alcohol or use illicit drugs.  REVIEW OF SYSTEMS:   Constitutional: low grade fever, chills, weight loss, malaise/fatigue and diaphoresis.  HENT: Negative for hearing loss, ear pain, nosebleeds, congestion, sore throat, neck pain, tinnitus and ear discharge.   Eyes: Negative for blurred vision, double vision, photophobia, pain, discharge and redness.  Respiratory:  Cough now non-productive, states she has been trying to cough as instructed per post-op, hemoptysis, sputum production, shortness of breath, with activity  wheezing and stridor.   Cardiovascular: post op chest pain, palpitations, orthopnea, claudication, leg swelling and PND.  Gastrointestinal: reflux and heart burn symptoms are remarkable worse than baseline , nausea, vomiting, abdominal pain, diarrhea, constipation, blood in stool and melena.  Genitourinary: Negative for dysuria, urgency, frequency,  hematuria and flank pain.  Musculoskeletal: Negative for myalgias, back pain, joint pain and falls.  Skin: Negative for itching and rash.  Neurological: Negative for dizziness, tingling, tremors, sensory change, speech change, focal weakness, seizures, loss of consciousness, weakness and headaches.  Endo/Heme/Allergies: Negative for environmental allergies and polydipsia. Does not bruise/bleed easily.  SUBJECTIVE:  Frustrates as she wants to go home  VITAL SIGNS: Temp:  [98.3 F (36.8 C)-100.4 F (38 C)] 98.3 F (36.8 C) (04/07 0700) Pulse Rate:  [88-101] 90 (04/07 0448) Resp:  [15-28] 20 (04/07 0448) BP: (104-117)/(63-68) 111/65 mmHg (04/07 0448) SpO2:  [93 %-96 %] 95 % (04/07 0448) FiO2 (%):  [98 %] 98 % (04/06 2000) Weight:  [84.3 kg (185 lb 13.6 oz)] 84.3 kg (185 lb 13.6 oz) (04/07 0500)  PHYSICAL EXAMINATION: General:  No distress  Neuro:  No focal def  HEENT:  [place], no JVD  Neck:  Right  IJ unremarkable  Cardiovascular:  rrr Lungs:  Crackles in bases, no accessory muscle use. Window un-remarkable  Abdomen:  + bowel sounds, no OM  Musculoskeletal:  Intact  Skin:  Lower extremity trace edema    Recent Labs Lab 11/09/12 0500 11/10/12 0459 11/11/12 0454  NA 135 134* 135  K 2.9* 3.3* 3.9  CL 102 101 100  CO2 21 26 28   BUN 6 6 9   CREATININE 0.52 0.57 0.62  GLUCOSE 107* 109* 95    Recent Labs Lab 11/08/12 0230 11/09/12 0500 11/10/12 0459  HGB 10.3* 10.4* 9.4*  HCT 30.5* 31.2* 28.5*  WBC 10.5 16.8* 14.5*  PLT 440* 416* 390   Dg Chest 2 View  11/10/2012  *RADIOLOGY REPORT*  Clinical Data: Status post drainage of pericardial effusion.  CHEST - 2 VIEW  Comparison: 11/08/2012  Findings: Pericardial drain remains.  Cardiac and mediastinal contours are stable.  Lungs show increase in right lower lung atelectasis and associated small amount of right pleural fluid. Left lower lobe atelectasis is stable.  No pulmonary edema or pneumothorax identified.  Right jugular central  line positioning stable.  IMPRESSION: Increase in right lower lung atelectasis and associated small right pleural effusion.  Stable left lower lobe atelectasis.   Original Report Authenticated By: Irish Lack, M.D.    Dg Chest Port 1 View  11/11/2012  *RADIOLOGY REPORT*  Clinical Data: Chest tube  PORTABLE CHEST - 1 VIEW  Comparison: Yesterday  Findings: Internal jugular vein central venous catheters stable. Mild cardiomegaly.  Bilateral central basilar opacities worse. Small pleural effusions stable.  No pneumothorax.  IMPRESSION: Bilateral airspace opacities in a pulmonary edema pattern are worse.  Stable small pleural effusions.   Original Report Authenticated By: Jolaine Click, M.D.   agree she has progressive bilateral airspace disease.   ASSESSMENT / PLAN:  1) traumatic Pericardial effusion after fall (in setting of therapeutic anticoagulation) s/p window on 4/4. Thoracic surgery planning on CT removal 4/7 2) h/o AF, currently NSR 3) Antiphospholipid AB positive w/ h/o DVT: back on coumadin 4) CREST syndrome/Scloroderma 5 ) Pulmonary infiltrates: These have evolved since 4/6.   Being asked to see for evolving pulmonary infiltrates. She had essentially a clear CXR on admit and infiltrates first noted as of 4/6. Diff dx include: pulmonary edema. Agree that a repeat ECHO is in order, HCAP, or possibly element of aspiration pneumonitis. Of note Izabel states that her reflux (a complication of her CREST syndrome) is much worse than baseline. She states she is typically asymptomatic on BID nexium, she classifies her symptoms as severe currently. Do not think that this represents evolving ILD as the onset has been rather rapid. But could represent pneumonitis flare of her underlying connective tissue disease.    Recommendations -f/u ECHO -push diuresis  -ck: BNP, ESR, Procalcitonin -DS DNA, ANA, complement C3, C4 and CH 50.  -UA for microscopic hematuria  -Strict reflux precautions -add HCAP  coverage -will add low dose, short term reglan AC and HS. Will need to watch for diarrhea, which has been an issue in the past d/t her CREST syndrome (which is why she is on Rifaximin).  -will f/u CXR in am - Check Doppler legs to rule out DVT   Pulmonary and Critical Care Medicine [facility] Pager: [phone]  11/11/2012, 8:45 AM    STAFF NOTE: I, Dr Lavinia Sharps have personally reviewed patient's available data, including medical history, events of note, physical examination and test results as part of my evaluation. I  have discussed with resident/NP and other care providers such as pharmacist, RN and RRT.  In addition,  I personally evaluated patient and elicited key findings of  bilateral small pleural effusions and pulmonary infiltrates after pericardiocentesis and pericardial window. Etiology is unclear but differential diagnosis includes hospital-acquired pneumonia, diastolic heart failure, chemical aspiration pneumonitis due to uncontrolled acid reflux from her scleroderma. Scleroderma can cause interstitial lung disease and pulmonary hypertension but current clinical picture does not seem consistent with that. Nevertheless, we'll check autoimmune parameters to reflect disease activity including urinalysis. I have discussed with Dr. Dierdre Forth her rheumatologist.  Rest per NP/medical resident whose note is outlined above and that I agree with      Dr. Kalman Shan, M.D., [facility].C.P Pulmonary and Critical Care Medicine Staff Physician [facility] Pulmonary and Critical Care Pager: [phone], If no answer or between  15:00h - 7:00h: call [phone]  11/11/2012 10:21 AM

## 2012-11-11 NOTE — Progress Notes (Signed)
ANTICOAGULATION CONSULT NOTE - Follow up Consult  Pharmacy Consult for coumadin Indication: atrial fibrillation  Allergies  Allergen Reactions  . Codeine Nausea And Vomiting  . Demerol   . Ultram (Tramadol Hcl) Other (See Comments)    Makes her hands shake.    Patient Measurements: Height: 5\' 6"  (167.6 cm) Weight: 185 lb 13.6 oz (84.3 kg) IBW/kg (Calculated) : 59.3  Vital Signs: Temp: 98.3 F (36.8 C) (04/07 0700) Temp src: Oral (04/07 0700) BP: 111/65 mmHg (04/07 0448) Pulse Rate: 90 (04/07 0448)  Labs:  Recent Labs  11/09/12 0500 11/10/12 0459 11/11/12 0454  HGB 10.4* 9.4*  --   HCT 31.2* 28.5*  --   PLT 416* 390  --   LABPROT  --   --  16.8*  INR  --   --  1.40  CREATININE 0.52 0.57 0.62    Estimated Creatinine Clearance: 83.9 ml/min (by C-G formula based on Cr of 0.62).   Medical History: Past Medical History  Diagnosis Date  . DVT (deep venous thrombosis)   . CREST syndrome   . Pericardial effusion   . MVP (mitral valve prolapse)   . Antiphospholipid antibody syndrome   . ITP (idiopathic thrombocytopenic purpura)   . Raynaud's phenomenon   . Hypertension   . Paroxysmal atrial fibrillation   . Atrial fibrillation 09/18/2011  . Antiphospholipid antibody positive 10/10/2012  . Hemorrhagic pericardial effusion 11/07/2012  . CREST syndrome 11/08/2012  . 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 Calcium Carbonate-Vitamin D (CALCIUM + D PO) Take 1 tablet by mouth daily.      . CYCLOBENZAPRINE HCL PO Take 1 tablet by mouth at bedtime as needed (muscle spasms).      Marland Kitchen esomeprazole (NEXIUM) 40 MG capsule Take 40 mg by mouth 2 (two) times daily.      Marland Kitchen gabapentin (NEURONTIN) 100 MG capsule Take 400 mg by mouth 2 (two) times daily.       Marland Kitchen HYDROcodone-acetaminophen (NORCO/VICODIN) 5-325 MG per tablet Take 1-2 tablets by mouth every 6 (six) hours as needed for pain.      Marland Kitchen  ipratropium (ATROVENT) 0.06 % nasal spray Place 2 sprays into the nose 4 (four) times daily as needed for rhinitis.      Marland Kitchen losartan (COZAAR) 50 MG tablet Take 50 mg by mouth at bedtime.       Marland Kitchen loteprednol (ALREX) 0.2 % SUSP Place 1 drop into both eyes 4 (four) times daily.      . metoprolol tartrate (LOPRESSOR) 25 MG tablet Take 25 mg by mouth 4 (four) times daily as needed. May take up to 4 times a day as needed for palps      . mometasone (NASONEX) 50 MCG/ACT nasal spray Place 2 sprays into the nose every morning.      Marland Kitchen NIFEdipine (PROCARDIA-XL/ADALAT CC) 30 MG 24 hr tablet Take 30 mg by mouth 2 (two) times daily.       Bertram Gala Glycol-Propyl Glycol (SYSTANE ULTRA OP) Apply 1 drop to eye 4 (four) times daily as needed (dry eyes).      . progesterone (PROMETRIUM) 100 MG capsule Take 100 mg by mouth at bedtime.       . rifaximin (XIFAXAN) 550 MG TABS Take 550 mg by mouth daily.      . Risedronate Sodium (ACTONEL PO) Take 1 tablet by mouth every 30 (thirty) days.      Marland Kitchen  warfarin (COUMADIN) 5 MG tablet Take 5 mg by mouth daily. 7.5mg  daily excpet take 7.5mg  on Sunday       Scheduled:  . antiseptic oral rinse  15 mL Mouth Rinse BID  . bisacodyl  10 mg Oral Daily  . calcium-vitamin D  1 tablet Oral Q breakfast  . Chlorhexidine Gluconate Cloth  6 each Topical Q0600  . enoxaparin  40 mg Subcutaneous Q24H  . esomeprazole  40 mg Oral BID  . fluticasone  1 spray Each Nare Daily  . [COMPLETED] furosemide  40 mg Intravenous Once  . furosemide  40 mg Oral Daily  . gabapentin  400 mg Oral BID  . HYDROmorphone PCA 0.3 mg/mL   Intravenous Q4H  . insulin aspart  0-24 Units Subcutaneous QID  . metoCLOPramide  5 mg Oral TID AC & HS  . mupirocin ointment  1 application Nasal BID  . piperacillin-tazobactam (ZOSYN)  IV  3.375 g Intravenous Q8H  . [COMPLETED] potassium chloride  40 mEq Oral Once  . [COMPLETED] potassium chloride  40 mEq Oral Once  . progesterone  100 mg Oral QHS  . rifaximin  550 mg  Oral Daily  . sodium chloride  10 mL Intravenous Q12H  . sodium chloride  10 mL Intravenous Q12H  . vancomycin  1,000 mg Intravenous Q8H  . [COMPLETED] warfarin  5 mg Oral ONCE-1800  . Warfarin - Pharmacist Dosing Inpatient   Does not apply q1800    Assessment: 57 yo female here with SOB and found with pericardial tamponade noted s/p pericardial window on 4/4 (noted hemorrhagic pericardial effusion). Patient with h/o positive anti-phospholipid antibody with history of DVTs as well as PAF. INR noted 3.83 on admit. Today INR = 1.4  (3.83 -> 3.54-> 1.84 -> 1.4 today ). No CBC today. Yesterday Hbg/Hct= 9.4/28.5 with plt= 390K. Admit H/H was 10.7/31.9.  Home coumadin dose: 7.5mg /day except 5mg  on Sun  Goal of Therapy:  INR 2.5- 3.0 (per MD) Monitor platelets by anticoagulation protocol: Yes   Plan:  -Coumadin 5mg  po today x1 -Daily PT/INR  Noah Delaine, RPh Clinical Pharmacist Pager: [phone]  11/11/2012 10:38 AM

## 2012-11-11 NOTE — ED Provider Notes (Signed)
Medical screening examination/treatment/procedure(s) were performed by non-physician practitioner and as supervising physician I was immediately available for consultation/collaboration.  Gilda Crease, MD 11/11/12 [phone]

## 2012-11-11 NOTE — Progress Notes (Signed)
   SUBJECTIVE:  No acute complaints. She does report a cough.  Her sats drop when she is off of her O2.     PHYSICAL EXAM Filed Vitals:   11/11/12 0000 11/11/12 0014 11/11/12 0448 11/11/12 0500  BP:  117/63 111/65   Pulse:  95 90   Temp:  100.4 F (38 C) 99 F (37.2 C)   TempSrc:  Oral Oral   Resp: 16 15 20    Height:      Weight:    185 lb 13.6 oz (84.3 kg)  SpO2: 96% 94% 95%    General:  No distress Lungs:  Diffuse expiratory wheezing. Heart:  RRR Abdomen:  Positive bowel sounds, no rebound no guarding Extremities:  Mild edema.  Neuro:  Nonfocal  LABS:  Results for orders placed during the hospital encounter of 11/07/12 (from the past 24 hour(s))  BASIC METABOLIC PANEL     Status: None   Collection Time    11/11/12  4:54 AM      Result Value Range   Sodium 135  135 - 145 mEq/L   Potassium 3.9  3.5 - 5.1 mEq/L   Chloride 100  96 - 112 mEq/L   CO2 28  19 - 32 mEq/L   Glucose, Bld 95  70 - 99 mg/dL   BUN 9  6 - 23 mg/dL   Creatinine, Ser 1.61  0.50 - 1.10 mg/dL   Calcium 9.3  8.4 - 09.6 mg/dL   GFR calc non Af Amer >90  >90 mL/min   GFR calc Af Amer >90  >90 mL/min  PROTIME-INR     Status: Abnormal   Collection Time    11/11/12  4:54 AM      Result Value Range   Prothrombin Time 16.8 (*) 11.6 - 15.2 seconds   INR 1.40  0.00 - 1.49    Intake/Output Summary (Last 24 hours) at 11/11/12 0651 Last data filed at 11/11/12 0500  Gross per 24 hour  Intake    926 ml  Output   2498 ml  Net  -1572 ml    ASSESSMENT AND PLAN:  Pericardial effusion:   Traumatic.  Status post window.  Back on warfarin per pharmacy.    Atrial fibrillation:  NSR.    Antiphospholipid antibody positive:  She is back on warfarin and she is getting Lovenox DVT prophylaxis and sequential compression boots.     HTN:  BP is on the lower side without her usual meds.  Continue to follow.  Abnormal CXR:  Bilateral diffuse fluffy infiltrates on CXR with wheezing.   Good UO yesterday after IV  Lasix.   Repeat echo for EF has been ordered. I will give oral Lasix today.  Might need to consider broadening antibiotics.  I will discuss with [facility].    [name] 11/11/2012 6:51 AM

## 2012-11-11 NOTE — Progress Notes (Signed)
Removed patient medistinal chest tube at 1100, patient tolerated procedure well. BP 114/64, HR 94, O2 sats 99% on room air, pain a 4/10, encouraged patient to use PCA. Will continue to monitor.

## 2012-11-11 NOTE — Progress Notes (Signed)
ANTIBIOTIC CONSULT NOTE - INITIAL  Pharmacy Consult for Vancomycin and Zosyn Indication: suspected  pneumonia  Allergies  Allergen Reactions  . Codeine Nausea And Vomiting  . Demerol   . Ultram (Tramadol Hcl) Other (See Comments)    Makes her hands shake.    Patient Measurements: Height: 5\' 6"  (167.6 cm) Weight: 185 lb 13.6 oz (84.3 kg) IBW/kg (Calculated) : 59.3 kg   Vital Signs: Temp: 98.3 F (36.8 C) (04/07 0700) Temp src: Oral (04/07 0700) BP: 111/65 mmHg (04/07 0448) Pulse Rate: 90 (04/07 0448) Intake/Output from previous day: 04/06 0701 - 04/07 0700 In: 926 [P.O.:910; I.V.:16] Out: 2773 [Urine:2750; Chest Tube:23] Intake/Output from this shift: Total I/O In: 10 [I.V.:10] Out: -   Labs:  Recent Labs  11/09/12 0500 11/10/12 0459 11/11/12 0454  WBC 16.8* 14.5*  --   HGB 10.4* 9.4*  --   PLT 416* 390  --   CREATININE 0.52 0.57 0.62   Estimated Creatinine Clearance: 83.9 ml/min (by C-G formula based on Cr of 0.62). No results found for this basename: VANCOTROUGH, Leodis Binet, VANCORANDOM, GENTTROUGH, GENTPEAK, GENTRANDOM, TOBRATROUGH, TOBRAPEAK, TOBRARND, AMIKACINPEAK, AMIKACINTROU, AMIKACIN,  in the last 72 hours   Microbiology: Recent Results (from the past 720 hour(s))  MRSA PCR SCREENING     Status: Abnormal   Collection Time    11/08/12  3:15 AM      Result Value Range Status   MRSA by PCR POSITIVE (*) NEGATIVE Final   Comment:            The GeneXpert MRSA Assay (FDA     approved for NASAL specimens     only), is one component of a     comprehensive MRSA colonization     surveillance program. It is not     intended to diagnose MRSA     infection nor to guide or     monitor treatment for     MRSA infections.     RESULT CALLED TO, READ BACK BY AND VERIFIED WITH:     CALLED TO RN Vivi Martens [id number] @[phone]  THANEY  URINE CULTURE     Status: None   Collection Time    11/08/12  4:02 AM      Result Value Range Status   Specimen Description  URINE, CLEAN CATCH   Final   Special Requests NONE   Final   Culture  Setup Time 11/08/2012 04:02   Final   Colony Count >=100,000 COLONIES/ML   Final   Culture     Final   Value: GROUP B STREP(S.AGALACTIAE)ISOLATED     Note: TESTING AGAINST S. AGALACTIAE NOT ROUTINELY PERFORMED DUE TO PREDICTABILITY OF AMP/PEN/VAN SUSCEPTIBILITY.   Report Status 11/09/2012 FINAL   Final  BODY FLUID CULTURE     Status: None   Collection Time    11/08/12 11:53 AM      Result Value Range Status   Specimen Description FLUID PERICARDIAL   Final   Special Requests PT ON ZINACEF   Final   Gram Stain     Final   Value: WBC PRESENT,BOTH PMN AND MONONUCLEAR     NO ORGANISMS SEEN   Culture NO GROWTH 2 DAYS   Final   Report Status PENDING   Incomplete    Medical History: Past Medical History  Diagnosis Date  . DVT (deep venous thrombosis)   . CREST syndrome   . Pericardial effusion   . MVP (mitral valve prolapse)   . Antiphospholipid antibody syndrome   .  ITP (idiopathic thrombocytopenic purpura)   . Raynaud's phenomenon   . Hypertension   . Paroxysmal atrial fibrillation   . Atrial fibrillation 09/18/2011  . Antiphospholipid antibody positive 10/10/2012  . Hemorrhagic pericardial effusion 11/07/2012  . CREST syndrome 11/08/2012  . 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 antiseptic oral rinse  15 mL Mouth Rinse BID  . bisacodyl  10 mg Oral Daily  . calcium-vitamin D  1 tablet Oral Q breakfast  . Chlorhexidine Gluconate Cloth  6 each Topical Q0600  . enoxaparin  40 mg Subcutaneous Q24H  . esomeprazole  40 mg Oral BID  . fluticasone  1 spray Each Nare Daily  . [COMPLETED] furosemide  40 mg Intravenous Once  . furosemide  40 mg Oral Daily  . gabapentin  400 mg Oral BID  . HYDROmorphone PCA 0.3 mg/mL   Intravenous Q4H  . insulin aspart  0-24 Units Subcutaneous QID  . metoCLOPramide  5 mg Oral TID AC & HS  . mupirocin ointment  1 application Nasal BID  . [COMPLETED]  potassium chloride  40 mEq Oral Once  . [COMPLETED] potassium chloride  40 mEq Oral Once  . progesterone  100 mg Oral QHS  . rifaximin  550 mg Oral Daily  . sodium chloride  10 mL Intravenous Q12H  . sodium chloride  10 mL Intravenous Q12H  . [COMPLETED] warfarin  5 mg Oral ONCE-1800  . Warfarin - Pharmacist Dosing Inpatient   Does not apply q1800   Assessment: 57 y.o female with h/o CREST syndrome/scleroderma, APLSAS syndrome on chronic anticoagulation for 2 prior DVTs. Presented to [facility] on 4/4 with SOB and significant dyspnea. She is s/p fall at work 2 weeks prior.  CTA revealed large pericardial loculated effusion. S/P pericardial window on 4/4.  CXR 4/6 =pulmonary infiltrates. Today began reporting cough and her CXR demonstrated worsening aeration.  Pharmacy consulted to dose vancomycin and zosyn for suspected PNA.  SCr =0.62, estimated CrCl ~ 84 ml/min  Goal of Therapy:  Vancomycin trough level = 15-20 mcg/ml  Plan: Zosyn 3.375 g IV q8h (infuse each dose over 4 hrs) Vancomycin 1g IV q8h  Check vancomycin trough at steady state.    Noah Delaine, RPh Clinical Pharmacist Pager: [phone] 11/11/2012,10:18 AM

## 2012-11-11 NOTE — Progress Notes (Signed)
VASCULAR LAB PRELIMINARY  PRELIMINARY  PRELIMINARY  PRELIMINARY  Bilateral lower extremity venous duplex  completed.    Preliminary report:  Bilateral:  No evidence of DVT, superficial thrombosis, or Baker's Cyst.   Taras Rask, RVT 11/11/2012, 12:30 PM

## 2012-11-11 NOTE — Progress Notes (Signed)
11/11/12 2045  Vitals  Temp ! 101.1 F (38.4 C)   Pt already on Vanco and Zosyn  C berge called 650 tylenol ordered will continue to monitor pt currently obtaining 1000 on IS

## 2012-11-11 NOTE — Progress Notes (Signed)
   CARDIOTHORACIC SURGERY PROGRESS NOTE  3 Days Post-Op  S/P Procedure(s) (LRB): SUBXYPHOID PERICARDIAL WINDOW (N/A)  Subjective: Feels better.  Denies SOB.  Objective: Vital signs in last 24 hours: Temp:  [98.3 F (36.8 C)-100.4 F (38 C)] 98.3 F (36.8 C) (04/07 0700) Pulse Rate:  [88-101] 90 (04/07 0448) Cardiac Rhythm:  [-] Normal sinus rhythm (04/07 0014) Resp:  [15-28] 20 (04/07 0448) BP: (104-117)/(63-68) 111/65 mmHg (04/07 0448) SpO2:  [93 %-96 %] 95 % (04/07 0448) FiO2 (%):  [98 %] 98 % (04/06 2000) Weight:  [84.3 kg (185 lb 13.6 oz)] 84.3 kg (185 lb 13.6 oz) (04/07 0500)  Physical Exam:  Rhythm:   sinus  Breath sounds: Bibasilar crackles  Heart sounds:  RRR  Incisions:  Clean and dry  Abdomen:  soft  Extremities:  Warm  Chest tube:  Trivial output   Intake/Output from previous day: 04/06 0701 - 04/07 0700 In: 926 [P.O.:910; I.V.:16] Out: 2773 [Urine:2750; Chest Tube:23] Intake/Output this shift:    Lab Results:  Recent Labs  11/09/12 0500 11/10/12 0459  WBC 16.8* 14.5*  HGB 10.4* 9.4*  HCT 31.2* 28.5*  PLT 416* 390   BMET:  Recent Labs  11/10/12 0459 11/11/12 0454  NA 134* 135  K 3.3* 3.9  CL 101 100  CO2 26 28  GLUCOSE 109* 95  BUN 6 9  CREATININE 0.57 0.62  CALCIUM 9.0 9.3    CBG (last 3)  No results found for this basename: GLUCAP,  in the last 72 hours PT/INR:   Recent Labs  11/11/12 0454  LABPROT 16.8*  INR 1.40    CXR:  *RADIOLOGY REPORT*  Clinical Data: Chest tube  PORTABLE CHEST - 1 VIEW  Comparison: Yesterday  Findings: Internal jugular vein central venous catheters stable.  Mild cardiomegaly. Bilateral central basilar opacities worse.  Small pleural effusions stable. No pneumothorax.  IMPRESSION:  Bilateral airspace opacities in a pulmonary edema pattern are  worse. Stable small pleural effusions.  Original Report Authenticated By: Jolaine Click, M.D.    Assessment/Plan: S/P Procedure(s) (LRB): SUBXYPHOID  PERICARDIAL WINDOW (N/A)  Overall stable Bibasilar rales on exam and CXR looks wet - I agree w/ IV lasix today D/C chest tube Coumadin F/U path + cytology Mobilize  OWEN,CLARENCE H 11/11/2012 8:32 AM

## 2012-11-12 ENCOUNTER — Inpatient Hospital Stay (HOSPITAL_COMMUNITY): Payer: 59

## 2012-11-12 DIAGNOSIS — R0989 Other specified symptoms and signs involving the circulatory and respiratory systems: Secondary | ICD-10-CM

## 2012-11-12 DIAGNOSIS — M349 Systemic sclerosis, unspecified: Secondary | ICD-10-CM

## 2012-11-12 DIAGNOSIS — R0609 Other forms of dyspnea: Secondary | ICD-10-CM

## 2012-11-12 LAB — TYPE AND SCREEN
Antibody Screen: NEGATIVE
Unit division: 0
Unit division: 0

## 2012-11-12 LAB — BODY FLUID CULTURE

## 2012-11-12 LAB — BASIC METABOLIC PANEL
BUN: 8 mg/dL (ref 6–23)
CO2: 29 mEq/L (ref 19–32)
Calcium: 9.6 mg/dL (ref 8.4–10.5)
Creatinine, Ser: 0.7 mg/dL (ref 0.50–1.10)
Glucose, Bld: 101 mg/dL — ABNORMAL HIGH (ref 70–99)

## 2012-11-12 LAB — CBC
MCH: 29.5 pg (ref 26.0–34.0)
MCHC: 33.3 g/dL (ref 30.0–36.0)
MCV: 88.6 fL (ref 78.0–100.0)
Platelets: 401 10*3/uL — ABNORMAL HIGH (ref 150–400)
RBC: 3.32 MIL/uL — ABNORMAL LOW (ref 3.87–5.11)
RDW: 15.2 % (ref 11.5–15.5)

## 2012-11-12 LAB — ANTI-SCLERODERMA ANTIBODY: Scleroderma (Scl-70) (ENA) Antibody, IgG: <1 AU/mL (ref <30)

## 2012-11-12 LAB — ANTI-NUCLEAR AB-TITER (ANA TITER)

## 2012-11-12 MED ORDER — GUAIFENESIN ER 600 MG PO TB12
600.0000 mg | ORAL_TABLET | Freq: Two times a day (BID) | ORAL | Status: DC
  Administered 2012-11-12 – 2012-11-13 (×3): 600 mg via ORAL
  Filled 2012-11-12 (×6): qty 1

## 2012-11-12 MED ORDER — WARFARIN SODIUM 5 MG PO TABS
5.0000 mg | ORAL_TABLET | Freq: Once | ORAL | Status: AC
  Administered 2012-11-12: 5 mg via ORAL
  Filled 2012-11-12: qty 1

## 2012-11-12 MED ORDER — POTASSIUM CHLORIDE CRYS ER 20 MEQ PO TBCR
40.0000 meq | EXTENDED_RELEASE_TABLET | Freq: Once | ORAL | Status: AC
  Administered 2012-11-12: 40 meq via ORAL
  Filled 2012-11-12: qty 2

## 2012-11-12 NOTE — Progress Notes (Addendum)
                    [address]            [place] [address]          [phone]     4 Days Post-Op Procedure(s) (LRB): SUBXYPHOID PERICARDIAL WINDOW (N/A)  Subjective: Just back from x-ray.  Sore, some non-productive cough.    Objective: Vital signs in last 24 hours: Patient Vitals for the past 24 hrs:  BP Temp Temp src Pulse Resp SpO2 Weight  11/12/12 0800 - - - - 17 - -  11/12/12 0433 - - - - - - 185 lb 3 oz (84 kg)  11/12/12 0400 116/68 mmHg 98 F (36.7 C) Oral 90 19 95 % -  11/11/12 2345 99/59 mmHg 99.3 F (37.4 C) Oral - 19 94 % -  11/11/12 2045 119/76 mmHg 101.1 F (38.4 C) Oral 102 24 93 % -  11/11/12 1551 - - - - 20 - -  11/11/12 1545 112/60 mmHg 100.5 F (38.1 C) Oral 90 20 99 % -  11/11/12 1200 114/64 mmHg 100.4 F (38 C) Oral 92 21 98 % -  11/11/12 1152 - 100.4 F (38 C) - - - - -   Current Weight  11/12/12 185 lb 3 oz (84 kg)     Intake/Output from previous day: 04/07 0701 - 04/08 0700 In: 850 [P.O.:840; I.V.:10] Out: 1950 [Urine:1950]    PHYSICAL EXAM:  Heart: RRR Lungs: crackles in bases Wound: Dressed and dry     Lab Results: CBC: Recent Labs  11/10/12 0459 11/12/12 0400  WBC 14.5* 9.6  HGB 9.4* 9.8*  HCT 28.5* 29.4*  PLT 390 401*   BMET:  Recent Labs  11/11/12 0454 11/12/12 0400  NA 135 139  K 3.9 3.6  CL 100 102  CO2 28 29  GLUCOSE 95 101*  BUN 9 8  CREATININE 0.62 0.70  CALCIUM 9.3 9.6    PT/INR:  Recent Labs  11/12/12 0400  LABPROT 15.6*  INR 1.27   Cytology: Negative for malignancy Path: benign inflamed pericardial tissue, no evidence for malignancy  CXR: Findings: Stable mild cardiomegaly. Right internal jugular  catheter line is unchanged in position. Mild bilateral pleural  effusions are noted. Linear opacity is noted in the lingula most  consistent with subsegmental atelectasis. Bilateral lung opacities  noted on prior exam are otherwise significantly improved or  resolved.    IMPRESSION:  Mild bilateral pleural effusions. Minimal subsegmental atelectasis  seen in lingular region. No other significant abnormality seen.   Assessment/Plan: S/P Procedure(s) (LRB): SUBXYPHOID PERICARDIAL WINDOW (N/A) Tm 101 overnight, probably atelectasis.  WBC trending down.  Will watch temp curve and continue current abx.  Encouraged increased pulm toilet/IS/FV.  Will add Mucinex for cough. Continue diuresis. Other issues per CCM, cardiology.   LOS: 5 days    COLLINS,GINA H 11/12/2012  I have seen and examined the patient and agree with the assessment and plan as outlined.  Pericardial fluid cytology and biopsy pathology results benign as expected.  OWEN,CLARENCE H 11/12/2012 9:00 AM

## 2012-11-12 NOTE — Consult Note (Signed)
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 She was s/p fall at work 2 weeks prior, slipping on an object on the floor. Her INR was briefly subtherapeutic but she was 3.8 on admit. She was saturating in the 80s on arrival and she responded to 4L leading to the ER team to pursue CTA to look for PE. No PE found but a large pericardial loculated effusion was noted on CT compressing the right ventricle. She was admitted to Cards service. Underwent pericardial window on 4/4. On 4/5 had un-eventful day. On 4/6 CXR began to demonstrate pulmonary infiltrates which were treated w/ diuresis. On 4/7 she began to report cough and her CXR demonstrated worsening aeration in spite of diuresis and neg fluid balance. PCCM was asked to see on 4/7 for worsening infiltrates.    Per Dr Dierdre Forth 11/11/12: Patient used to be seen by Dr Norina Buzzard in past. Past year follows with Dr Dierdre Forth. Per his chart review: patient only has scleroderma and not SLE   SIGNIFICANT EVENTS / STUDIES:  Pericardial window 4/4 Right IJ CVL 4/4>>> Echo 4/5>>> ECHO 4/7>>>  LINES / TUBES:   CULTURES: MRSA PCR 4/4: positive  UC 4/4: gp B strep Pericardial fluid 4/4: >>>  ANTIBIOTICS: Rifaximin      PAST MEDICAL HISTORY :  Past Medical History  Diagnosis Date  . DVT (deep venous thrombosis)   . CREST syndrome   . Pericardial effusion   . MVP (mitral valve prolapse)   . Antiphospholipid antibody syndrome   .  ITP (idiopathic thrombocytopenic purpura)   . Raynaud's phenomenon   . Hypertension   . Paroxysmal atrial fibrillation   . Atrial fibrillation 09/18/2011  . Antiphospholipid antibody positive 10/10/2012  . Hemorrhagic pericardial effusion 11/07/2012  . CREST syndrome 11/08/2012  . 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 Splenectomy      ITP  . Uterine ablation    . Tubal ligation    . Subxyphoid pericardial window N/A 11/08/2012    Procedure: SUBXYPHOID PERICARDIAL WINDOW;  Surgeon: Purcell Nails, MD;  Location: [facility] OR;  Service: Thoracic;  Laterality: N/A;   Prior to Admission medications   Medication Sig Start Date End Date Taking? Authorizing Provider  Calcium Carbonate-Vitamin D (CALCIUM + D PO) Take 1 tablet by mouth daily.   Yes Historical Provider, MD  CYCLOBENZAPRINE HCL PO Take 1 tablet by mouth at bedtime as needed (muscle spasms).   Yes Historical Provider, MD  esomeprazole (NEXIUM) 40 MG capsule Take 40 mg by mouth 2 (two) times daily.   Yes Historical Provider, MD  gabapentin (NEURONTIN) 100 MG capsule Take 400 mg by mouth 2 (two) times daily.    Yes Historical Provider, MD  HYDROcodone-acetaminophen (NORCO/VICODIN) 5-325 MG per tablet Take 1-2 tablets by mouth every 6 (six) hours as needed for pain. 10/11/12  Yes Jeanella Craze, NP  ipratropium (ATROVENT) 0.06 % nasal spray Place 2 sprays into the nose 4 (four)  times daily as needed for rhinitis.   Yes Historical Provider, MD  losartan (COZAAR) 50 MG tablet Take 50 mg by mouth at bedtime.    Yes Historical Provider, MD  loteprednol (ALREX) 0.2 % SUSP Place 1 drop into both eyes 4 (four) times daily.   Yes Historical Provider, MD  metoprolol tartrate (LOPRESSOR) 25 MG tablet Take 25 mg by mouth 4 (four) times daily as needed. May take up to 4 times a day as needed for palps 09/18/11  Yes [name], MD  mometasone (NASONEX) 50 MCG/ACT nasal spray Place 2 sprays into the nose  every morning.   Yes Historical Provider, MD  NIFEdipine (PROCARDIA-XL/ADALAT CC) 30 MG 24 hr tablet Take 30 mg by mouth 2 (two) times daily.    Yes Historical Provider, MD  Polyethyl Glycol-Propyl Glycol (SYSTANE ULTRA OP) Apply 1 drop to eye 4 (four) times daily as needed (dry eyes).   Yes Historical Provider, MD  progesterone (PROMETRIUM) 100 MG capsule Take 100 mg by mouth at bedtime.    Yes Historical Provider, MD  rifaximin (XIFAXAN) 550 MG TABS Take 550 mg by mouth daily.   Yes Historical Provider, MD  Risedronate Sodium (ACTONEL PO) Take 1 tablet by mouth every 30 (thirty) days.   Yes Historical Provider, MD  warfarin (COUMADIN) 5 MG tablet Take 5 mg by mouth daily. 7.5mg  daily excpet take 7.5mg  on Sunday   Yes Historical Provider, MD   Allergies  Allergen Reactions  . Codeine Nausea And Vomiting  . Demerol   . Ultram (Tramadol Hcl) Other (See Comments)    Makes her hands shake.    FAMILY HISTORY:  Family History  Problem Relation Age of Onset  . Cancer Mother     Lung  . Diabetes Father   . COPD Father   . Hyperlipidemia Father   . Hypertension Father    SOCIAL HISTORY:  reports that she has never smoked. She has never used smokeless tobacco. She reports that she does not drink alcohol or use illicit drugs.  REVIEW OF SYSTEMS:   Constitutional: low grade fever, chills, weight loss, malaise/fatigue and diaphoresis.  HENT: Negative for hearing loss, ear pain, nosebleeds, congestion, sore throat, neck pain, tinnitus and ear discharge.   Eyes: Negative for blurred vision, double vision, photophobia, pain, discharge and redness.  Respiratory:  Cough now non-productive, states she has been trying to cough as instructed per post-op, hemoptysis, sputum production, shortness of breath, with activity  wheezing and stridor.   Cardiovascular: post op chest pain, palpitations, orthopnea, claudication, leg swelling and PND.  Gastrointestinal: reflux and heart burn symptoms are  remarkable worse than baseline , nausea, vomiting, abdominal pain, diarrhea, constipation, blood in stool and melena.  Genitourinary: Negative for dysuria, urgency, frequency, hematuria and flank pain.  Musculoskeletal: Negative for myalgias, back pain, joint pain and falls.  Skin: Negative for itching and rash.  Neurological: Negative for dizziness, tingling, tremors, sensory change, speech change, focal weakness, seizures, loss of consciousness, weakness and headaches.  Endo/Heme/Allergies: Negative for environmental allergies and polydipsia. Does not bruise/bleed easily.  SUBJECTIVE:  Frustrates as she wants to go home  VITAL SIGNS: Temp:  [98 F (36.7 C)-101.1 F (38.4 C)] 98.8 F (37.1 C) (04/08 0800) Pulse Rate:  [90-105] 105 (04/08 0800) Resp:  [17-24] 17 (04/08 0800) BP: (99-119)/(59-76) 116/66 mmHg (04/08 0800) SpO2:  [93 %-99 %] 96 % (04/08 0800) FiO2 (%):  [98 %] 98 % (04/08 0400) Weight:  [84 kg (185 lb 3 oz)] 84  kg (185 lb 3 oz) (04/08 0433)  PHYSICAL EXAMINATION: General:  No distress  Neuro:  No focal def  HEENT:  [place], no JVD  Neck:  Right IJ unremarkable  Cardiovascular:  rrr Lungs:  Crackles in bases, no accessory muscle use. Window un-remarkable  Abdomen:  + bowel sounds, no OM  Musculoskeletal:  Intact  Skin:  Lower extremity trace edema   Recent Labs Lab 11/07/12 1832  PROBNP 557.6*     Recent Labs Lab 11/10/12 0459 11/11/12 0454 11/12/12 0400  NA 134* 135 139  K 3.3* 3.9 3.6  CL 101 100 102  CO2 26 28 29   BUN 6 9 8   CREATININE 0.57 0.62 0.70  GLUCOSE 109* 95 101*    Recent Labs Lab 11/09/12 0500 11/10/12 0459 11/12/12 0400  HGB 10.4* 9.4* 9.8*  HCT 31.2* 28.5* 29.4*  WBC 16.8* 14.5* 9.6  PLT 416* 390 401*   Dg Chest 2 View  11/12/2012  *RADIOLOGY REPORT*  Clinical Data: Congestive heart failure  CHEST - 2 VIEW  Comparison: November 11, 2012.  Findings: Stable mild cardiomegaly.  Right internal jugular catheter line is unchanged in  position.  Mild bilateral pleural effusions are noted.  Linear opacity is noted in the lingula most consistent with subsegmental atelectasis.  Bilateral lung opacities noted on prior exam are otherwise significantly improved or resolved.  IMPRESSION: Mild bilateral pleural effusions.  Minimal subsegmental atelectasis seen in lingular region.  No other significant abnormality seen.   Original Report Authenticated By: Lupita Raider.,  M.D.    Dg Chest Port 1 View  11/11/2012  *RADIOLOGY REPORT*  Clinical Data: Chest tube  PORTABLE CHEST - 1 VIEW  Comparison: Yesterday  Findings: Internal jugular vein central venous catheters stable. Mild cardiomegaly.  Bilateral central basilar opacities worse. Small pleural effusions stable.  No pneumothorax.  IMPRESSION: Bilateral airspace opacities in a pulmonary edema pattern are worse.  Stable small pleural effusions.   Original Report Authenticated By: Jolaine Click, M.D.   agree she has progressive bilateral airspace disease.   ASSESSMENT / PLAN:  1) traumatic Pericardial effusion after fall (in setting of therapeutic anticoagulation) s/p window on 4/4. Thoracic surgery planning on CT removal 4/7 2) h/o AF, currently NSR 3) Antiphospholipid AB positive w/ h/o DVT: back on coumadin 4) CREST syndrome/Scloroderma 5 ) Pulmonary infiltrates: These have evolved since 4/6.   Being asked to see for evolving pulmonary infiltrates. She had essentially a clear CXR on admit and infiltrates first noted as of 4/6. Diff dx include: pulmonary edema. Agree that a repeat ECHO is in order, HCAP, or possibly element of aspiration pneumonitis. Of note Doriana states that her reflux (a complication of her CREST syndrome) is much worse than baseline. She states she is typically asymptomatic on BID nexium, she classifies her symptoms as severe currently. Do not think that this represents evolving ILD as the onset has been rather rapid. But could represent pneumonitis flare of her underlying  connective tissue disease.     4.8/14: She is feeling better. She did have some overnight fever but that has improved. Chest x-ray is clearing. Clinically this is looking less cough autoimmune disease flare. Doppler legs are negative for DVT. Feet are somewhat consistent with volume overload or diastolic heart failure or hospital acquired pneumonia/pneumonitis.    Recmmendations -Await autoimmune profile - Continue current management of antibiotics, acid reflux control   Dr. Kalman Shan, M.D., [facility].C.P Pulmonary and Critical Care Medicine Staff Physician [facility] Pager: [phone], If no answer or between  15:00h - 7:00h: call [phone]  11/12/2012 12:28 PM

## 2012-11-12 NOTE — Progress Notes (Signed)
Patient ID: [name], female   DOB: October 29, 1955, 57 y.o.   MRN: [id number]   Patient Name: [name] Date of Encounter: 11/12/2012    SUBJECTIVE  Sitting in chair. Feels flutter valve will help her. No CP. Chest tube out. Temp spike last evening.  CURRENT MEDS . antiseptic oral rinse  15 mL Mouth Rinse BID  . bisacodyl  10 mg Oral Daily  . calcium-vitamin D  1 tablet Oral Q breakfast  . Chlorhexidine Gluconate Cloth  6 each Topical Q0600  . enoxaparin  40 mg Subcutaneous Q24H  . esomeprazole  40 mg Oral BID  . fluticasone  1 spray Each Nare Daily  . furosemide  40 mg Oral Daily  . gabapentin  400 mg Oral BID  . HYDROmorphone PCA 0.3 mg/mL   Intravenous Q4H  . insulin aspart  0-24 Units Subcutaneous QID  . metoCLOPramide  5 mg Oral TID AC & HS  . mupirocin ointment  1 application Nasal BID  . piperacillin-tazobactam (ZOSYN)  IV  3.375 g Intravenous Q8H  . progesterone  100 mg Oral QHS  . rifaximin  550 mg Oral Daily  . sodium chloride  10 mL Intravenous Q12H  . sodium chloride  10 mL Intravenous Q12H  . vancomycin  1,000 mg Intravenous Q8H  . Warfarin - Pharmacist Dosing Inpatient   Does not apply q1800    OBJECTIVE  Filed Vitals:   11/11/12 2045 11/11/12 2345 11/12/12 0400 11/12/12 0433  BP: 119/76 99/59 116/68   Pulse: 102  90   Temp: 101.1 F (38.4 C) 99.3 F (37.4 C) 98 F (36.7 C)   TempSrc: Oral Oral Oral   Resp: 24 19 19    Height:      Weight:    185 lb 3 oz (84 kg)  SpO2: 93% 94% 95%     Intake/Output Summary (Last 24 hours) at 11/12/12 0811 Last data filed at 11/12/12 0600  Gross per 24 hour  Intake    730 ml  Output   1950 ml  Net  -1220 ml   Filed Weights   11/10/12 0500 11/11/12 0500 11/12/12 0433  Weight: 187 lb 6.3 oz (85 kg) 185 lb 13.6 oz (84.3 kg) 185 lb 3 oz (84 kg)    PHYSICAL EXAM  General: Pleasant, NAD. Neuro: Alert and oriented X 3. Moves all extremities spontaneously. Psych: Normal affect. HEENT:  Normal  Neck: Supple  without bruits or JVD. Lungs:  Resp regular and unlabored, CTA. Heart: RRR no s3, s4, or murmurs. No rub Abdomen: Soft, non-tender, non-distended, BS + x 4.  Extremities: No clubbing, cyanosis or edema. DP/PT/Radials 2+ and equal bilaterally.  Accessory Clinical Findings  CBC  Recent Labs  11/10/12 0459 11/12/12 0400  WBC 14.5* 9.6  HGB 9.4* 9.8*  HCT 28.5* 29.4*  MCV 88.8 88.6  PLT 390 401*   Basic Metabolic Panel  Recent Labs  11/11/12 0454 11/12/12 0400  NA 135 139  K 3.9 3.6  CL 100 102  CO2 28 29  GLUCOSE 95 101*  BUN 9 8  CREATININE 0.62 0.70  CALCIUM 9.3 9.6   Liver Function Tests  Recent Labs  11/10/12 0459  AST 14  ALT 18  ALKPHOS 130*  BILITOT 0.6  PROT 5.9*  ALBUMIN 2.6*   No results found for this basename: LIPASE, AMYLASE,  in the last 72 hours Cardiac Enzymes No results found for this basename: CKTOTAL, CKMB, CKMBINDEX, TROPONINI,  in the last 72 hours BNP No components  found with this basename: POCBNP,  D-Dimer No results found for this basename: DDIMER,  in the last 72 hours Hemoglobin A1C No results found for this basename: HGBA1C,  in the last 72 hours Fasting Lipid Panel No results found for this basename: CHOL, HDL, LDLCALC, TRIG, CHOLHDL, LDLDIRECT,  in the last 72 hours Thyroid Function Tests No results found for this basename: TSH, T4TOTAL, FREET3, T3FREE, THYROIDAB,  in the last 72 hours  TELE  NSR  ECG    Radiology/Studies  Dg Chest 2 View  11/10/2012  *RADIOLOGY REPORT*  Clinical Data: Status post drainage of pericardial effusion.  CHEST - 2 VIEW  Comparison: 11/08/2012  Findings: Pericardial drain remains.  Cardiac and mediastinal contours are stable.  Lungs show increase in right lower lung atelectasis and associated small amount of right pleural fluid. Left lower lobe atelectasis is stable.  No pulmonary edema or pneumothorax identified.  Right jugular central line positioning stable.  IMPRESSION: Increase in right  lower lung atelectasis and associated small right pleural effusion.  Stable left lower lobe atelectasis.   Original Report Authenticated By: Irish Lack, M.D.    Dg Chest 2 View  11/07/2012  *RADIOLOGY REPORT*  Clinical Data: Short of breath  CHEST - 2 VIEW  Comparison: 09/03/2012  Findings: Borderline cardiomegaly.  Small pleural effusions.  Mild vascular congestion.  Subsegmental atelectasis at the left base. No pneumothorax.  IMPRESSION: Borderline cardiomegaly with vascular congestion and small pleural effusions.   Original Report Authenticated By: Jolaine Click, M.D.    Ct Angio Chest Pe W/cm &/or Wo Cm  11/07/2012  *RADIOLOGY REPORT*  Clinical Data: Severe shortness of breath with exertion.  Recent wrist and clavicular fractures.  CT ANGIOGRAPHY CHEST  Technique:  Multidetector CT imaging of the chest using the standard protocol during bolus administration of intravenous contrast. Multiplanar reconstructed images including MIPs were obtained and reviewed to evaluate the vascular anatomy.  Contrast: OMNIPAQUE IOHEXOL 350 MG/ML SOLN  Comparison: 08/30/2010  Findings: Heterogeneous enlargement of the left lobe of the thyroid extending into the substernal space and displacing the trachea towards the right.  Mass measures about 4.3 x 5.1 cm.  There is some calcification.  The appearance is stable since previous study.  Technically adequate study with good opacification of the central and segmental pulmonary arteries.  No focal filling defects demonstrated.  No evidence of significant pulmonary embolus.  There is interval development of a large pericardial effusion which appears loculated towards the right anteriorly. Density measurements are increased, suggesting that this might be hemorrhagic or infected.  The effusion causes constriction of the right ventricle.  Correlate for clinical signs of cardiac tamponade.  There is reflux of contrast material into the inferior vena cava.  New bilateral pleural  effusions, greater on the right, with atelectasis or consolidation in the lung bases.  The esophagus is distended and filled with fluid, gas, and heterogeneous material consistent with food.  This may represent esophageal dysmotility or achalasia.  Scattered lymph nodes in the mediastinum and hilar regions are not pathologically enlarged.  Airways appear patent. No pneumothorax.  Mild degenerative changes of the thoracic spine.  IMPRESSION: No evidence of significant pulmonary embolus.  Large pericardial effusion appears loculated in the right anteriorly and causing constriction of the right ventricle.  Bilateral pleural effusions, greater on the right, with atelectasis in both lung bases. Esophageal dilatation suggesting achalasia or dysmotility.  Stable large left thyroid goiter.  Results were discussed by telephone with Dr. Bernette Mayers at 2127 hours  on 11/07/2012.   Original Report Authenticated By: Burman Nieves, M.D.    Dg Chest Port 1 View  11/11/2012  *RADIOLOGY REPORT*  Clinical Data: Chest tube  PORTABLE CHEST - 1 VIEW  Comparison: Yesterday  Findings: Internal jugular vein central venous catheters stable. Mild cardiomegaly.  Bilateral central basilar opacities worse. Small pleural effusions stable.  No pneumothorax.  IMPRESSION: Bilateral airspace opacities in a pulmonary edema pattern are worse.  Stable small pleural effusions.   Original Report Authenticated By: Jolaine Click, M.D.    Dg Chest Portable 1 View  11/08/2012  *RADIOLOGY REPORT*  Clinical Data: Postop cardiotomy  PORTABLE CHEST - 1 VIEW  Comparison: Chest radiograph 11/07/2012  Findings: There is a right central venous line with tip in the distal SVC.  Stable cardiac silhouette.  There is mild atelectasis in the left lung.  No pulmonary edema.  No pneumothorax.  IMPRESSION:  1.  Right central venous line in proper position. 2.  Mild cardiomegaly.  3.  Mild left atelectasis.   Original Report Authenticated By: Genevive Bi, M.D.      ASSESSMENT AND PLAN  Principal Problem:   Hemorrhagic pericardial effusion Active Problems:   HTN (hypertension)   Dyspnea   Atrial fibrillation   Antiphospholipid antibody positive   Chronic anticoagulation   CREST syndrome   Bilateral pulmonary infiltrates on CXR   Bilateral pleural effusion    Improving and feeling better. For PA and Lat CXR this am. Pulmonary involved. Will receive 40mg  of Lasix this am. Will supplement potassium. Flutter valve ordered.  Signed, Valera Castle MD

## 2012-11-12 NOTE — Progress Notes (Signed)
Pt PCA d/cd per MD order, wasted 2.4 mg or 8 ml of dilaudid IV with Kathlene Cote, RN.   Leta Baptist, RN

## 2012-11-12 NOTE — Progress Notes (Signed)
Utilization review completed.  

## 2012-11-12 NOTE — Progress Notes (Addendum)
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 Codeine Nausea And Vomiting  . Demerol   . Ultram (Tramadol Hcl) Other (See Comments)    Makes her hands shake.    Patient Measurements: Height: 5\' 6"  (167.6 cm) Weight: 185 lb 3 oz (84 kg) IBW/kg (Calculated) : 59.3  Vital Signs: Temp: 98.3 F (36.8 C) (04/08 1200) Temp src: Oral (04/08 1200) BP: 116/59 mmHg (04/08 1200) Pulse Rate: 104 (04/08 1200)  Labs:  Recent Labs  11/10/12 0459 11/11/12 0454 11/12/12 0400  HGB 9.4*  --  9.8*  HCT 28.5*  --  29.4*  PLT 390  --  401*  LABPROT  --  16.8* 15.6*  INR  --  1.40 1.27  CREATININE 0.57 0.62 0.70     Recent Labs  11/10/12 0459 11/11/12 0454 11/12/12 0400  WBC 14.5*  --  9.6  HGB 9.4*  --  9.8*  PLT 390  --  401*  CREATININE 0.57 0.62 0.70   Estimated Creatinine Clearance: 83.7 ml/min (by C-G formula based on Cr of 0.7).    11/12/12 1140  VANCOTROUGH 13.0     Microbiology: Recent Results (from the past 720 hour(s))  MRSA PCR SCREENING     Status: Abnormal   Collection Time    11/08/12  3:15 AM      Result Value Range Status   MRSA by PCR POSITIVE (*) NEGATIVE Final   Comment:            The GeneXpert MRSA Assay (FDA     approved for NASAL specimens     only), is one component of a     comprehensive MRSA colonization     surveillance program. It is not     intended to diagnose MRSA     infection nor to guide or     monitor treatment for     MRSA infections.     RESULT CALLED TO, READ BACK BY AND VERIFIED WITH:     CALLED TO RN Vivi Martens [id number] @[phone]  THANEY  URINE CULTURE     Status: None   Collection Time    11/08/12  4:02 AM      Result Value Range  Status   Specimen Description URINE, CLEAN CATCH   Final   Special Requests NONE   Final   Culture  Setup Time 11/08/2012 04:02   Final   Colony Count >=100,000 COLONIES/ML   Final   Culture     Final   Value: GROUP B STREP(S.AGALACTIAE)ISOLATED     Note: TESTING AGAINST S. AGALACTIAE NOT ROUTINELY PERFORMED DUE TO PREDICTABILITY OF AMP/PEN/VAN SUSCEPTIBILITY.   Report Status 11/09/2012 FINAL   Final  BODY FLUID CULTURE     Status: None   Collection Time    11/08/12 11:53 AM      Result Value Range Status   Specimen Description FLUID PERICARDIAL   Final   Special Requests PT ON ZINACEF   Final   Gram Stain     Final   Value: WBC PRESENT,BOTH PMN AND MONONUCLEAR     NO ORGANISMS SEEN   Culture NO GROWTH 3 DAYS   Final   Report Status 11/12/2012 FINAL   Final    Anti-infectives  Start     Dose/Rate Route Frequency Ordered Stop   11/11/12 1230  piperacillin-tazobactam (ZOSYN) IVPB 3.375 g     3.375 g 12.5 mL/hr over 240 Minutes Intravenous Every 8 hours 11/11/12 1037     11/11/12 1130  vancomycin (VANCOCIN) IVPB 1000 mg/200 mL premix     1,000 mg 200 mL/hr over 60 Minutes Intravenous Every 8 hours 11/11/12 1037     11/08/12 2200  cefUROXime (ZINACEF) 1.5 g in dextrose 5 % 50 mL IVPB     1.5 g 100 mL/hr over 30 Minutes Intravenous Every 12 hours 11/08/12 1529 11/09/12 0955   11/08/12 1528  cefUROXime (ZINACEF) 1.5 g in dextrose 5 % 50 mL IVPB  Status:  Discontinued     1.5 g 100 mL/hr over 30 Minutes Intravenous 60 min pre-op 11/08/12 1528 11/08/12 1635   11/08/12 1200  cefUROXime (ZINACEF) 1.5 g in dextrose 5 % 50 mL IVPB  Status:  Discontinued     1.5 g 100 mL/hr over 30 Minutes Intravenous To Surgery 11/08/12 1148 11/08/12 1504   11/08/12 1000  rifaximin (XIFAXAN) tablet 550 mg     550 mg Oral Daily 11/08/12 0214        Medical History: Past Medical History  Diagnosis Date  . DVT (deep venous thrombosis)   . CREST syndrome   . Pericardial effusion   . MVP (mitral  valve prolapse)   . Antiphospholipid antibody syndrome   . ITP (idiopathic thrombocytopenic purpura)   . Raynaud's phenomenon   . Hypertension   . Paroxysmal atrial fibrillation   . Atrial fibrillation 09/18/2011  . Antiphospholipid antibody positive 10/10/2012  . Hemorrhagic pericardial effusion 11/07/2012  . CREST syndrome 11/08/2012  . 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 Calcium Carbonate-Vitamin D (CALCIUM + D PO) Take 1 tablet by mouth daily.      . CYCLOBENZAPRINE HCL PO Take 1 tablet by mouth at bedtime as needed (muscle spasms).      Marland Kitchen esomeprazole (NEXIUM) 40 MG capsule Take 40 mg by mouth 2 (two) times daily.      Marland Kitchen gabapentin (NEURONTIN) 100 MG capsule Take 400 mg by mouth 2 (two) times daily.       Marland Kitchen HYDROcodone-acetaminophen (NORCO/VICODIN) 5-325 MG per tablet Take 1-2 tablets by mouth every 6 (six) hours as needed for pain.      Marland Kitchen ipratropium (ATROVENT) 0.06 % nasal spray Place 2 sprays into the nose 4 (four) times daily as needed for rhinitis.      Marland Kitchen losartan (COZAAR) 50 MG tablet Take 50 mg by mouth at bedtime.       Marland Kitchen loteprednol (ALREX) 0.2 % SUSP Place 1 drop into both eyes 4 (four) times daily.      . metoprolol tartrate (LOPRESSOR) 25 MG tablet Take 25 mg by mouth 4 (four) times daily as needed. May take up to 4 times a day as needed for palps      . mometasone (NASONEX) 50 MCG/ACT nasal spray Place 2 sprays into the nose every morning.      Marland Kitchen NIFEdipine (PROCARDIA-XL/ADALAT CC) 30 MG 24 hr tablet Take 30 mg by mouth 2 (two) times daily.       Bertram Gala Glycol-Propyl Glycol (SYSTANE ULTRA OP) Apply 1 drop to eye 4 (four) times daily as needed (dry eyes).      . progesterone (PROMETRIUM) 100 MG capsule Take 100 mg by mouth  at bedtime.       . rifaximin (XIFAXAN) 550 MG TABS Take 550 mg by mouth daily.      . Risedronate Sodium (ACTONEL PO) Take 1 tablet by mouth every 30 (thirty)  days.      Marland Kitchen warfarin (COUMADIN) 5 MG tablet Take 5 mg by mouth daily. 7.5mg  daily excpet take 7.5mg  on Sunday       Scheduled:  . antiseptic oral rinse  15 mL Mouth Rinse BID  . bisacodyl  10 mg Oral Daily  . calcium-vitamin D  1 tablet Oral Q breakfast  . Chlorhexidine Gluconate Cloth  6 each Topical Q0600  . enoxaparin  40 mg Subcutaneous Q24H  . esomeprazole  40 mg Oral BID  . fluticasone  1 spray Each Nare Daily  . furosemide  40 mg Oral Daily  . gabapentin  400 mg Oral BID  . guaiFENesin  600 mg Oral BID  . HYDROmorphone PCA 0.3 mg/mL   Intravenous Q4H  . insulin aspart  0-24 Units Subcutaneous QID  . metoCLOPramide  5 mg Oral TID AC & HS  . mupirocin ointment  1 application Nasal BID  . piperacillin-tazobactam (ZOSYN)  IV  3.375 g Intravenous Q8H  . [COMPLETED] potassium chloride  40 mEq Oral Once  . progesterone  100 mg Oral QHS  . rifaximin  550 mg Oral Daily  . sodium chloride  10 mL Intravenous Q12H  . sodium chloride  10 mL Intravenous Q12H  . vancomycin  1,000 mg Intravenous Q8H  . [COMPLETED] warfarin  5 mg Oral ONCE-1800  . Warfarin - Pharmacist Dosing Inpatient   Does not apply q1800    Assessment: 57 yo female here with SOB and found with pericardial tamponade noted s/p pericardial window on 4/4 (noted hemorrhagic pericardial effusion). Patient with h/o positive anti-phospholipid antibody with history of DVTs as well as PAF. On 4/3 admit INR = 3.83. Today INR = 1.27 , decreased from 1.4 on yesterday.  CBC stable with Hgb 9.8, pltc 401K.    Home coumadin dose: 7.5mg /day except 5mg  on Sun (last taken 4/3 PTA). Coumadin resumed 11/10/12. He has received 5mg  daily x 2 days. Also currently receiving lovenox 40 mg SQ q24h for VTE prophylaxis per CVTS.  Dopplers bilateral LE completed on 4/7 = No evidence of DVT, superficial thrombosis, or Baker's Cyst.    Vancomycin trough today = 13 mcg/ml on vancomycin 1 g IV q8hr.  Had received only 3 doses prior to steady state  trough drawn.  Anticipate trough should approach ~79mcg/ml on current dose of 1g IV q8h in this 57 y.o female weighing 84 kg and CrCl ~ 84 ml/min.  UOP 1.1 ml/kg/hr yesterday.  Afebrile and WBC is within normal.    Goal of Therapy:  INR 2.5- 3.0 (per MD) Monitor platelets by anticoagulation protocol: Yes Vancomycin trough level 15-20 mcg/ml    Plan:  -Continue Vancomycin IV at current dose of 1g IV q8h -No change in Zosyn. -Coumadin 5mg  po today x1 -Daily PT/INR -Stop Lovenox when INR =/>2.5 (or 2.0 ) CVTS to determine.   Noah Delaine, RPh Clinical Pharmacist Pager: [phone]  11/12/2012 3:30 PM

## 2012-11-13 LAB — PROCALCITONIN: Procalcitonin: <0.1 ng/mL

## 2012-11-13 MED ORDER — WARFARIN SODIUM 10 MG PO TABS
10.0000 mg | ORAL_TABLET | Freq: Once | ORAL | Status: AC
  Administered 2012-11-13: 10 mg via ORAL
  Filled 2012-11-13: qty 1

## 2012-11-13 NOTE — Progress Notes (Signed)
ANTICOAGULATION CONSULT NOTE - Follow Up Consult  Pharmacy Consult for Coumadin Indication: DVT, Afib  Allergies  Allergen Reactions  . Codeine Nausea And Vomiting  . Demerol   . Ultram (Tramadol Hcl) Other (See Comments)    Makes her hands shake.    Patient Measurements: Height: 5\' 6"  (167.6 cm) Weight: 181 lb 7 oz (82.3 kg) IBW/kg (Calculated) : 59.3 Heparin Dosing Weight:   Vital Signs: Temp: 98.1 F (36.7 C) (04/09 0446) Temp src: Oral (04/09 0446) BP: 97/59 mmHg (04/09 0813) Pulse Rate: 85 (04/09 0813)  Labs:  Recent Labs  11/11/12 0454 11/12/12 0400 11/13/12 0450  HGB  --  9.8*  --   HCT  --  29.4*  --   PLT  --  401*  --   LABPROT 16.8* 15.6* 16.4*  INR 1.40 1.27 1.35  CREATININE 0.62 0.70  --     Estimated Creatinine Clearance: 82.9 ml/min (by C-G formula based on Cr of 0.7).  Pharmacy protocols: Warfarin, Zosyn, Vanco Assessment: 57 yo F w/ CREST syndrome/Scleroderma, APLAS syndrome on chronic anticoagulation for two prior DVTs/ PAF, ITP w prior splenectomy, HTN.  Presents on 4/4 w/ SOB, signif dyspnea. s/p fall at work 2 wks prior. Found with pericardial tamponade after fall in setting of therapeutic anticoagulation; , s/p pericardial window on 4/4 (noted hemorrhagic pericardial effusion).  Anticoagulation: h/o +anti-phospholipid antibody w/ h/o 2 prior DVTs/ PAF. Dopplers 4/7 bilat LE= NEG. INR 1.35 today on Lovenox 40mg /d until INR therapeutic. (Home WARF dose: 7.5mg /day except 5mg  qSun (admit INR 3.83))   Goal of Therapy:  INR 2.5-3 per MD Monitor platelets by anticoagulation protocol: Yes   Plan:  Coumadin 10mg  po x 1 tonight  Merilynn Finland, [employer] 11/13/2012,10:07 AM

## 2012-11-13 NOTE — Progress Notes (Signed)
PULMONARY  / CRITICAL CARE MEDICINE  Name: [name] MRN: [id number] DOB: 08-21-1955    ADMISSION DATE:  11/07/2012 CONSULTATION DATE: 4/7  REFERRING MD :  Carney Harder PRIMARY SERVICE:  Tresa Endo  CHIEF COMPLAINT:  Pulmonary infiltrates   HISTORY OF PRESENT ILLNESS:   57 yo woman with CREST syndrome/Scleroderma, APLAS syndrome on chronic anticoagulation for two prior DVTS, ITP with prior splenectomy, hypertension and mention of trivial pericardial effusion on a prior echo with most recent echo without any effusion who presents on 4/4 with SOB and significant dyspnea. She was s/p fall at work 2 weeks prior, slipping on an object on the floor. Her INR was briefly subtherapeutic but she was 3.8 on admit. She was saturating in the 80s on arrival and she responded to 4L leading to the ER team to pursue CTA to look for PE. No PE found but a large pericardial loculated effusion was noted on CT compressing the right ventricle. She was admitted to Cards service. Underwent pericardial window on 4/4. On 4/5 had un-eventful day. On 4/6 CXR began to demonstrate pulmonary infiltrates which were treated w/ diuresis. On 4/7 she began to report cough and her CXR demonstrated worsening aeration in spite of diuresis and neg fluid balance. PCCM was asked to see on 4/7 for worsening infiltrates.    Per Dr Dierdre Forth 11/11/12: Patient used to be seen by Dr Norina Buzzard in past. Past year follows with Dr Dierdre Forth. Per his chart review: patient only has scleroderma and not SLE   SIGNIFICANT EVENTS / STUDIES:  Pericardial window 4/4 > drain removed 4/714 Right IJ CVL 4/4>>> Echo 4/5>>> ECHO 4/7>>>  LINES / TUBES:   CULTURES: MRSA PCR 4/4: positive  UC 4/4: gp B strep Pericardial fluid 4/4: >>>  PATH Pericardial bx 4/4 - benign tissue. No malignancy  ANTIBIOTICS: Rifaximin     SUBJECTIVE:  Better    VITAL SIGNS: Temp:  [98.1 F (36.7 C)-100.1 F (37.8 C)] 98.1 F (36.7 C) (04/09 0813) Pulse Rate:  [84-104]  87 (04/09 1030) Resp:  [17-21] 18 (04/09 1030) BP: (97-116)/(52-63) 108/63 mmHg (04/09 1030) SpO2:  [94 %-98 %] 97 % (04/09 1030) Weight:  [82.3 kg (181 lb 7 oz)] 82.3 kg (181 lb 7 oz) (04/09 0448)  PHYSICAL EXAMINATION: General:  No distress  Neuro:  No focal def  HEENT:  Colony, no JVD  Neck:  Right IJ unremarkable  Cardiovascular:  rrr Lungs:  Crackles in bases, no accessory muscle use. Window un-remarkable  Abdomen:  + bowel sounds, no OM  Musculoskeletal:  Intact  Skin:  Lower extremity trace edema   Recent Labs Lab 11/07/12 1832  PROBNP 557.6*     Recent Labs Lab 11/10/12 0459 11/11/12 0454 11/12/12 0400  NA 134* 135 139  K 3.3* 3.9 3.6  CL 101 100 102  CO2 26 28 29   BUN 6 9 8   CREATININE 0.57 0.62 0.70  GLUCOSE 109* 95 101*    Recent Labs Lab 11/09/12 0500 11/10/12 0459 11/12/12 0400  HGB 10.4* 9.4* 9.8*  HCT 31.2* 28.5* 29.4*  WBC 16.8* 14.5* 9.6  PLT 416* 390 401*   Dg Chest 2 View  11/12/2012  *RADIOLOGY REPORT*  Clinical Data: Congestive heart failure  CHEST - 2 VIEW  Comparison: November 11, 2012.  Findings: Stable mild cardiomegaly.  Right internal jugular catheter line is unchanged in position.  Mild bilateral pleural effusions are noted.  Linear opacity is noted in the lingula most consistent with subsegmental atelectasis.  Bilateral lung opacities  noted on prior exam are otherwise significantly improved or resolved.  IMPRESSION: Mild bilateral pleural effusions.  Minimal subsegmental atelectasis seen in lingular region.  No other significant abnormality seen.   Original Report Authenticated By: Lupita Raider.,  M.D.   agree she has progressive bilateral airspace disease.   ASSESSMENT / PLAN:  1) traumatic Pericardial effusion after fall (in setting of therapeutic anticoagulation) s/p window on 4/4. Thoracic surgery planning on CT removal 4/7 2) h/o AF, currently NSR 3) Antiphospholipid AB positive w/ h/o DVT: back on coumadin 4) CREST  syndrome/Scloroderma 5 ) Pulmonary infiltrates: These have evolved since 4/6.   Being asked to see for evolving pulmonary infiltrates. She had essentially a clear CXR on admit and infiltrates first noted as of 4/6. Diff dx include: pulmonary edema. Agree that a repeat ECHO is in order, HCAP, or possibly element of aspiration pneumonitis. Of note Yudith states that her reflux (a complication of her CREST syndrome) is much worse than baseline. She states she is typically asymptomatic on BID nexium, she classifies her symptoms as severe currently. Do not think that this represents evolving ILD as the onset has been rather rapid. But could represent pneumonitis flare of her underlying connective tissue disease.     4.8/14: She is feeling better. She did have some overnight fever but that has improved. Chest x-ray is clearing. Clinically this is looking less cough autoimmune disease flare. Doppler legs are negative for DVT. Feet are somewhat consistent with volume overload or diastolic heart failure or hospital acquired pneumonia/pneumonitis.  11/13/12: Imrpoved clinically    Recmmendations -Await autoimmune profile - Continue current management of antibiotics, acid reflux control   Dr. Kalman Shan, M.D., [facility].C.P Pulmonary and Critical Care Medicine Staff Physician [facility] [place] [facility] Pager: [phone], If no answer or between  15:00h - 7:00h: call [phone]  11/13/2012 11:46 AM

## 2012-11-13 NOTE — Progress Notes (Signed)
   SUBJECTIVE:  No acute complaints.  She has chest soreness from coughing.  She has a nonproductive cough with deep breathing.   PHYSICAL EXAM Filed Vitals:   11/13/12 0446 11/13/12 0448 11/13/12 0530 11/13/12 0813  BP: 103/56   97/59  Pulse: 84   85  Temp: 98.1 F (36.7 C)     TempSrc: Oral     Resp:   21 19  Height:      Weight:  181 lb 7 oz (82.3 kg)    SpO2: 98%   96%   General:  No distress Lungs:  No wheezing, bilateral coarse basilar crackles. Heart:  RRR Abdomen:  Positive bowel sounds, no rebound no guarding Extremities:  Mild edema.    LABS:  Results for orders placed during the hospital encounter of 11/07/12 (from the past 24 hour(s))  VANCOMYCIN, TROUGH     Status: None   Collection Time    11/12/12 11:40 AM      Result Value Range   Vancomycin Tr 13.0  10.0 - 20.0 ug/mL  PROTIME-INR     Status: Abnormal   Collection Time    11/13/12  4:50 AM      Result Value Range   Prothrombin Time 16.4 (*) 11.6 - 15.2 seconds   INR 1.35  0.00 - 1.49  PROCALCITONIN     Status: None   Collection Time    11/13/12  4:50 AM      Result Value Range   Procalcitonin <0.10      Intake/Output Summary (Last 24 hours) at 11/13/12 0826 Last data filed at 11/13/12 0700  Gross per 24 hour  Intake   1300 ml  Output   1675 ml  Net   -375 ml    ASSESSMENT AND PLAN:  Pericardial effusion:   Traumatic.  Status post window.  Back on warfarin per pharmacy.    Atrial fibrillation:  NSR.    Antiphospholipid antibody positive:  She is back on warfarin although it might take a while for her INR to come up after her warfarin was reversed on admission.  Continue Lovenox.   HTN:  BP is running low.  No change in therapy.   Respiratory:  She had infiltrates on CXR probable edema.  She remains on PO Lasix.  Also, antibiotics per CCM.  I encouraged cough and deep breathing as she has probable atelectasis.    Fayrene Fearing [facility] 11/13/2012 8:26 AM

## 2012-11-13 NOTE — Progress Notes (Addendum)
                    [address]            [place] [address]          [phone]     5 Days Post-Op Procedure(s) (LRB): SUBXYPHOID PERICARDIAL WINDOW (N/A)  Subjective: Coughed a lot overnight, mostly nonproductive.  Sore.   Objective: Vital signs in last 24 hours: Patient Vitals for the past 24 hrs:  BP Temp Temp src Pulse Resp SpO2 Weight  11/13/12 0530 - - - - 21 - -  11/13/12 0448 - - - - - - 181 lb 7 oz (82.3 kg)  11/13/12 0446 103/56 mmHg 98.1 F (36.7 C) Oral 84 - 98 % -  11/12/12 2356 104/56 mmHg 99.9 F (37.7 C) Oral - 17 96 % -  11/12/12 2000 106/52 mmHg 100.1 F (37.8 C) Oral 95 20 94 % -  11/12/12 1623 111/63 mmHg 99.6 F (37.6 C) Oral 94 19 97 % -  11/12/12 1600 - - - - 20 - -  11/12/12 1200 116/59 mmHg 98.3 F (36.8 C) Oral 104 17 98 % -   Current Weight  11/13/12 181 lb 7 oz (82.3 kg)     Intake/Output from previous day: 04/08 0701 - 04/09 0700 In: 1540 [P.O.:960; I.V.:280; IV Piggyback:300] Out: 1926 [Urine:1925; Stool:1]    PHYSICAL EXAM:  Heart: RRR Lungs: Few basilar crackles, but sounds clearer today Wound: Clean and dry    Lab Results: CBC: Recent Labs  11/12/12 0400  WBC 9.6  HGB 9.8*  HCT 29.4*  PLT 401*   BMET:  Recent Labs  11/11/12 0454 11/12/12 0400  NA 135 139  K 3.9 3.6  CL 100 102  CO2 28 29  GLUCOSE 95 101*  BUN 9 8  CREATININE 0.62 0.70  CALCIUM 9.3 9.6    PT/INR:  Recent Labs  11/13/12 0450  LABPROT 16.4*  INR 1.35      Assessment/Plan: S/P Procedure(s) (LRB): SUBXYPHOID PERICARDIAL WINDOW (N/A) Stable from surgical standpoint. Continue current care per pulm, cardiology.   LOS: 6 days    COLLINS,GINA H 11/13/2012  I have seen and examined the patient and agree with the assessment and plan as outlined.  Thunder [place] H 11/13/2012 9:48 AM

## 2012-11-14 ENCOUNTER — Other Ambulatory Visit: Payer: Self-pay | Admitting: Physician Assistant

## 2012-11-14 ENCOUNTER — Encounter (HOSPITAL_COMMUNITY): Payer: Self-pay | Admitting: Physician Assistant

## 2012-11-14 DIAGNOSIS — E876 Hypokalemia: Secondary | ICD-10-CM

## 2012-11-14 DIAGNOSIS — D649 Anemia, unspecified: Secondary | ICD-10-CM

## 2012-11-14 DIAGNOSIS — E049 Nontoxic goiter, unspecified: Secondary | ICD-10-CM

## 2012-11-14 DIAGNOSIS — I313 Pericardial effusion (noninflammatory): Secondary | ICD-10-CM

## 2012-11-14 DIAGNOSIS — I480 Paroxysmal atrial fibrillation: Secondary | ICD-10-CM

## 2012-11-14 LAB — PROTIME-INR
INR: 1.46 (ref 0.00–1.49)
Prothrombin Time: 17.3 seconds — ABNORMAL HIGH (ref 11.6–15.2)

## 2012-11-14 LAB — BASIC METABOLIC PANEL
BUN: 5 mg/dL — ABNORMAL LOW (ref 6–23)
GFR calc Af Amer: >90 mL/min (ref >90)
GFR calc non Af Amer: >90 mL/min (ref >90)
Potassium: 3.3 mEq/L — ABNORMAL LOW (ref 3.5–5.1)

## 2012-11-14 LAB — CBC
Hemoglobin: 9.7 g/dL — ABNORMAL LOW (ref 12.0–15.0)
MCHC: 33.1 g/dL (ref 30.0–36.0)
RDW: 15.5 % (ref 11.5–15.5)
WBC: 5.5 10*3/uL (ref 4.0–10.5)

## 2012-11-14 MED ORDER — LEVOFLOXACIN 750 MG PO TABS
750.0000 mg | ORAL_TABLET | Freq: Every day | ORAL | Status: DC
  Administered 2012-11-14: 750 mg via ORAL
  Filled 2012-11-14: qty 1

## 2012-11-14 MED ORDER — METOPROLOL TARTRATE 12.5 MG HALF TABLET: 12.5000 mg | ORAL_TABLET | Freq: Two times a day (BID) | ORAL | Status: DC

## 2012-11-14 MED ORDER — DILTIAZEM HCL 100 MG IV SOLR
5.0000 mg/h | INTRAVENOUS | Status: DC
  Administered 2012-11-14: 5 mg/h via INTRAVENOUS
  Filled 2012-11-14: qty 100

## 2012-11-14 MED ORDER — POTASSIUM CHLORIDE ER 20 MEQ PO TBCR: 20.0000 meq | EXTENDED_RELEASE_TABLET | Freq: Every day | ORAL | Status: DC

## 2012-11-14 MED ORDER — ENOXAPARIN SODIUM 40 MG/0.4ML ~~LOC~~ SOLN: 40.0000 mg | SUBCUTANEOUS | Status: DC

## 2012-11-14 MED ORDER — FUROSEMIDE 40 MG PO TABS: 40.0000 mg | ORAL_TABLET | Freq: Every day | ORAL | Status: DC

## 2012-11-14 MED ORDER — WARFARIN SODIUM 5 MG PO TABS: 7.5000 mg | ORAL_TABLET | Freq: Every day | ORAL | Status: DC

## 2012-11-14 MED ORDER — WARFARIN SODIUM 10 MG PO TABS
10.0000 mg | ORAL_TABLET | Freq: Once | ORAL | Status: AC
  Administered 2012-11-14: 10 mg via ORAL
  Filled 2012-11-14: qty 1

## 2012-11-14 MED ORDER — OXYCODONE HCL 5 MG PO TABS: 5.0000 mg | ORAL_TABLET | ORAL | Status: DC | PRN

## 2012-11-14 MED ORDER — POTASSIUM CHLORIDE CRYS ER 20 MEQ PO TBCR
40.0000 meq | EXTENDED_RELEASE_TABLET | Freq: Once | ORAL | Status: AC
  Administered 2012-11-14: 40 meq via ORAL
  Filled 2012-11-14: qty 2

## 2012-11-14 MED ORDER — OXYCODONE HCL 5 MG PO TABS
5.0000 mg | ORAL_TABLET | ORAL | Status: DC | PRN
  Administered 2012-11-14: 10 mg via ORAL
  Filled 2012-11-14: qty 2

## 2012-11-14 MED ORDER — LEVOFLOXACIN 750 MG PO TABS: 750.0000 mg | ORAL_TABLET | Freq: Every day | ORAL | Status: AC

## 2012-11-14 NOTE — Progress Notes (Signed)
Patient ambulated the unit 600 ft, HR stayed in the 90's in normal sinus . Patient tolerated well.

## 2012-11-14 NOTE — Progress Notes (Addendum)
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 She was s/p fall at work 2 weeks prior, slipping on an object on the floor. Her INR was briefly subtherapeutic but she was 3.8 on admit. She was saturating in the 80s on arrival and she responded to 4L leading to the ER team to pursue CTA to look for PE. No PE found but a large pericardial loculated effusion was noted on CT compressing the right ventricle. She was admitted to Cards service. Underwent pericardial window on 4/4. On 4/5 had un-eventful day. On 4/6 CXR began to demonstrate pulmonary infiltrates which were treated w/ diuresis. On 4/7 she began to report cough and her CXR demonstrated worsening aeration in spite of diuresis and neg fluid balance. PCCM was asked to see on 4/7 for worsening infiltrates.    Per Dr Dierdre Forth 11/11/12: Patient used to be seen by Dr Norina Buzzard in past. Past year follows with Dr Dierdre Forth. Per his chart review: patient only has scleroderma and not SLE   SIGNIFICANT EVENTS / STUDIES:  Pericardial window 4/4 > drain removed 4/714 Right IJ CVL 4/4>>> Echo 4/5>>> ECHO 4/7>>>  LINES / TUBES:   CULTURES: MRSA PCR 4/4: positive  UC 4/4: gp B strep Pericardial fluid 4/4: >>>  PATH Pericardial bx 4/4 - benign tissue. No malignancy  ANTIBIOTICS: Anti-infectives   Start     Dose/Rate Route Frequency Ordered Stop   11/11/12 1230  piperacillin-tazobactam (ZOSYN) IVPB 3.375 g     3.375 g 12.5 mL/hr over  240 Minutes Intravenous Every 8 hours 11/11/12 1037     11/11/12 1130  vancomycin (VANCOCIN) IVPB 1000 mg/200 mL premix     1,000 mg 200 mL/hr over 60 Minutes Intravenous Every 8 hours 11/11/12 1037     11/08/12 2200  cefUROXime (ZINACEF) 1.5 g in dextrose 5 % 50 mL IVPB     1.5 g 100 mL/hr over 30 Minutes Intravenous Every 12 hours 11/08/12 1529 11/09/12 0955   11/08/12 1528  cefUROXime (ZINACEF) 1.5 g in dextrose 5 % 50 mL IVPB  Status:  Discontinued     1.5 g 100 mL/hr over 30 Minutes Intravenous 60 min pre-op 11/08/12 1528 11/08/12 1635   11/08/12 1200  cefUROXime (ZINACEF) 1.5 g in dextrose 5 % 50 mL IVPB  Status:  Discontinued     1.5 g 100 mL/hr over 30 Minutes Intravenous To Surgery 11/08/12 1148 11/08/12 1504   11/08/12 1000  rifaximin (XIFAXAN) tablet 550 mg     550 mg Oral Daily 11/08/12 0214           SUBJECTIVE:  Better . Wants to go home. Planning to ambulate   VITAL SIGNS: Temp:  [98 F (36.7 C)-98.8 F (37.1 C)] 98.1 F (36.7 C) (04/10 0800) Pulse Rate:  [81-88] 81 (04/10 0037) Resp:  [16-25] 16 (04/10 0800) BP: (91-111)/(55-82) 104/60 mmHg (04/10 0800) SpO2:  [94 %-99 %] 97 % (04/10 0800) Weight:  [82.5 kg (181 lb 14.1 oz)] 82.5 kg (181 lb 14.1 oz) (  04/10 0500)  PHYSICAL EXAMINATION: General:  No distress  Neuro:  No focal def  HEENT:  [place], no JVD  Neck:  Right IJ unremarkable  Cardiovascular:  rrr Lungs:  Crackles in bases, no accessory muscle use. Window un-remarkable  Abdomen:  + bowel sounds, no OM  Musculoskeletal:  Intact  Skin:  Lower extremity trace edema   Recent Labs Lab 11/07/12 1832  PROBNP 557.6*     Recent Labs Lab 11/11/12 0454 11/12/12 0400 11/14/12 0507  NA 135 139 138  K 3.9 3.6 3.3*  CL 100 102 105  CO2 28 29 24   BUN 9 8 5*  CREATININE 0.62 0.70 0.69  GLUCOSE 95 101* 101*    Recent Labs Lab 11/10/12 0459 11/12/12 0400 11/14/12 0507  HGB 9.4* 9.8* 9.7*  HCT 28.5* 29.4* 29.3*  WBC 14.5* 9.6 5.5  PLT 390 401*  377   No results found. x IMGAING x 48h   Recent Labs Lab 11/11/12 1120 11/12/12 0400 11/13/12 0450  PROCALCITON <0.10 0.83 <0.10      ASSESSMENT / PLAN:  1) traumatic Pericardial effusion after fall (in setting of therapeutic anticoagulation) s/p window on 4/4. Thoracic surgery planning on CT removal 4/7 2) h/o AF, currently NSR 3) Antiphospholipid AB positive w/ h/o DVT: back on coumadin 4) CREST syndrome/Scloroderma 5 ) Pulmonary infiltrates: These have evolved since 4/6.   Being asked to see for evolving pulmonary infiltrates. She had essentially a clear CXR on admit and infiltrates first noted as of 4/6. Diff dx include: pulmonary edema. Agree that a repeat ECHO is in order, HCAP, or possibly element of aspiration pneumonitis. Of note Lesha states that her reflux (a complication of her CREST syndrome) is much worse than baseline. She states she is typically asymptomatic on BID nexium, she classifies her symptoms as severe currently. Do not think that this represents evolving ILD as the onset has been rather rapid. But could represent pneumonitis flare of her underlying connective tissue disease.     4.8/14: She is feeling better. She did have some overnight fever but that has improved. Chest x-ray is clearing. Clinically this is looking less cough autoimmune disease flare. Doppler legs are negative for DVT. Feet are somewhat consistent with volume overload or diastolic heart failure or hospital acquired pneumonia/pneumonitis.  11/13/12: Imrpoved clinically  11/14/12: Furher clinical impriovement. Autoimmune 11/11/12 shows positive ANA and normal complement except C4. DS-DNA is normal. scl-70 is nomrmal. Therefore, no evidence of autioimmune disease activity.    - Also mild hypokalemia   - Procalcitonin profile favors early termination abx And suggests at the most localzied infection  Recmmendations  dc vanc Dc zosyn Do levaquin  daily x 3 days (uinstructed RN to inform pharmacy  for interaction watch with coumadin)    Dr. Kalman Shan, M.D., [facility].C.P Pulmonary and Critical Care Medicine Staff Physician  System [facility] Pager: [phone], If no answer or between  15:00h - 7:00h: call [phone]  11/14/2012 12:26 PM

## 2012-11-14 NOTE — Progress Notes (Signed)
ANTICOAGULATION CONSULT NOTE - Follow Up Consult  Pharmacy Consult for Coumadin Indication: DVT, Afib  Allergies  Allergen Reactions  . Codeine Nausea And Vomiting  . Demerol   . Ultram (Tramadol Hcl) Other (See Comments)    Makes her hands shake.    Patient Measurements: Height: 5\' 6"  (167.6 cm) Weight: 181 lb 14.1 oz (82.5 kg) IBW/kg (Calculated) : 59.3 Heparin Dosing Weight:   Vital Signs: Temp: 98.1 F (36.7 C) (04/10 0800) Temp src: Oral (04/10 0800) BP: 104/60 mmHg (04/10 0800) Pulse Rate: 81 (04/10 0037)  Labs:  Recent Labs  11/12/12 0400 11/13/12 0450 11/14/12 0507  HGB 9.8*  --  9.7*  HCT 29.4*  --  29.3*  PLT 401*  --  377  LABPROT 15.6* 16.4* 17.3*  INR 1.27 1.35 1.46  CREATININE 0.70  --  0.69    Estimated Creatinine Clearance: 83 ml/min (by C-G formula based on Cr of 0.69).  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 Presents on 4/4 w/ SOB, signif dyspnea. s/p fall at work 2 wks prior. Found with pericardial tamponade after fall in setting of therapeutic anticoagulation; s/p pericardial window on 4/4 (noted hemorrhagic pericardial effusion).  Anticoagulation: h/o +anti-phospholipid antibody w/ h/o 2 prior DVTs/ PAF. Dopplers 4/7 bilat LE= NEG. INR 1.46 today on Lovenox 40mg /d until INR therapeutic. (Home WARF dose: 7.5mg /day except 5mg  qSun (admit INR 3.83))  Goal of Therapy:  INR 2.5-3 per MD Monitor platelets by anticoagulation protocol: Yes   Plan:  Coumadin 10mg  po x 1 tonight  Chalon Zobrist, [employer] 11/14/2012,11:17 AM

## 2012-11-14 NOTE — Progress Notes (Signed)
Pt HR elevated into 130-140's, SBP 108. EKG obtained and pt in afib with RVR. MD Adelene Amas cardiology fellow, [place] made aware. New orders received, will continue to monitor.

## 2012-11-14 NOTE — Progress Notes (Signed)
Patient ambulated 2 x around unit, 600 feet, patient HR normal sinus in the 90'S, patient was not SOB. Will prepare to discharge patient per MD order.

## 2012-11-14 NOTE — Progress Notes (Signed)
SUBJECTIVE: Afib last night.  She has chest soreness but is no longer coughing.  She wants to go home.   PHYSICAL EXAM Filed Vitals:   11/14/12 0500 11/14/12 0534 11/14/12 0600 11/14/12 0800  BP: 91/65  111/70 104/60  Pulse:      Temp:    98.1 F (36.7 C)  TempSrc:    Oral  Resp: 20 20 25 16   Height:      Weight: 181 lb 14.1 oz (82.5 kg)     SpO2:    97%   General:  No distress Lungs:  No wheezing, bilateral coarse basilar crackles improved with a pleuritic rub. Heart:  RRR Abdomen:  Positive bowel sounds, no rebound no guarding Extremities:  No edema.    LABS:  Results for orders placed during the hospital encounter of 11/07/12 (from the past 24 hour(s))  PROTIME-INR     Status: Abnormal   Collection Time    11/14/12  5:07 AM      Result Value Range   Prothrombin Time 17.3 (*) 11.6 - 15.2 seconds   INR 1.46  0.00 - 1.49  BASIC METABOLIC PANEL     Status: Abnormal   Collection Time    11/14/12  5:07 AM      Result Value Range   Sodium 138  135 - 145 mEq/L   Potassium 3.3 (*) 3.5 - 5.1 mEq/L   Chloride 105  96 - 112 mEq/L   CO2 24  19 - 32 mEq/L   Glucose, Bld 101 (*) 70 - 99 mg/dL   BUN 5 (*) 6 - 23 mg/dL   Creatinine, Ser 1.02  0.50 - 1.10 mg/dL   Calcium 9.4  8.4 - 72.5 mg/dL   GFR calc non Af Amer >90  >90 mL/min   GFR calc Af Amer >90  >90 mL/min  CBC     Status: Abnormal   Collection Time    11/14/12  5:07 AM      Result Value Range   WBC 5.5  4.0 - 10.5 K/uL   RBC 3.37 (*) 3.87 - 5.11 MIL/uL   Hemoglobin 9.7 (*) 12.0 - 15.0 g/dL   HCT 36.6 (*) 44.0 - 34.7 %   MCV 86.9  78.0 - 100.0 fL   MCH 28.8  26.0 - 34.0 pg   MCHC 33.1  30.0 - 36.0 g/dL   RDW 42.5  95.6 - 38.7 %   Platelets 377  150 - 400 K/uL    Intake/Output Summary (Last 24 hours) at 11/14/12 0936 Last data filed at 11/14/12 0800  Gross per 24 hour  Intake    995 ml  Output   1500 ml  Net   -505 ml    EKG:  Atrial fibrillation, rate 148, no acute ST T wave changes.   11/14/2012  ASSESSMENT AND PLAN:  Pericardial effusion:   Traumatic. This was directly related to her fall at work.  Status post window.  Back on warfarin per pharmacy.  OK go home.  I would like to get a repeat echo in our office in two weeks.    Atrial fibrillation:  NSR now.  I will stop the IV Dilt.    Antiphospholipid antibody positive:  She is back on warfarin although it might take a while for her INR to come up after her warfarin was reversed on admission.  Continue Lovenox.   If OK with Dr. Cornelius Moras I would like to send home with bridging Lovenox.  HTN:  BP is running low.  I will not restart her previous antihypertensives at discharge.   Send home on low dose 12.5 metoprolol bid at discharge  Respiratory:  Home on Lasix 20 mg daily.  Talk to pulmonary about discharge antibiotics or whether these can be stopped.   Fayrene Fearing [facility] 11/14/2012 9:36 AM

## 2012-11-14 NOTE — Progress Notes (Addendum)
                    [address]            [place] [id number]          [phone]     6 Days Post-Op Procedure(s) (LRB): SUBXYPHOID PERICARDIAL WINDOW (N/A)  Subjective: Had an episode of AF overnight, now in SR on Cardizem gtt.  Sore under R ribs.   Objective: Vital signs in last 24 hours: Patient Vitals for the past 24 hrs:  BP Temp Temp src Pulse Resp SpO2 Weight  11/14/12 0600 111/70 mmHg - - - 25 - -  11/14/12 0534 - - - - 20 - -  11/14/12 0500 91/65 mmHg - - - 20 - 181 lb 14.1 oz (82.5 kg)  11/14/12 0400 99/82 mmHg 98 F (36.7 C) Oral - 18 98 % -  11/14/12 0037 104/59 mmHg 98.6 F (37 C) Oral 81 21 96 % -  11/13/12 2100 106/55 mmHg 98.2 F (36.8 C) Oral 86 17 96 % -  11/13/12 1651 104/74 mmHg 98.2 F (36.8 C) Oral 88 17 99 % -  11/13/12 1510 107/62 mmHg 98.8 F (37.1 C) Oral 88 23 94 % -  11/13/12 1220 104/61 mmHg 98 F (36.7 C) Oral 87 20 95 % -  11/13/12 1030 108/63 mmHg - - 87 18 97 % -  11/13/12 0813 97/59 mmHg 98.1 F (36.7 C) Oral 85 19 96 % -   Current Weight  11/14/12 181 lb 14.1 oz (82.5 kg)     Intake/Output from previous day: 04/09 0701 - 04/10 0700 In: 755 [P.O.:240; I.V.:15; IV Piggyback:500] Out: 1500 [Urine:1500]    PHYSICAL EXAM:  Heart: RRR Lungs: Clear Wound: Clean and dry    Lab Results: CBC: Recent Labs  11/12/12 0400 11/14/12 0507  WBC 9.6 5.5  HGB 9.8* 9.7*  HCT 29.4* 29.3*  PLT 401* 377   BMET:  Recent Labs  11/12/12 0400 11/14/12 0507  NA 139 138  K 3.6 3.3*  CL 102 105  CO2 29 24  GLUCOSE 101* 101*  BUN 8 5*  CREATININE 0.70 0.69  CALCIUM 9.6 9.4    PT/INR:  Recent Labs  11/14/12 0507  LABPROT 17.3*  INR 1.46      Assessment/Plan: S/P Procedure(s) (LRB): SUBXYPHOID PERICARDIAL WINDOW (N/A) Stable from surgical standpoint. Continue current care. Medical issues per cardiology, CCM.   LOS: 7 days    COLLINS,GINA H 11/14/2012  I have seen and examined the patient and  agree with the assessment and plan as outlined.  Plan per Cardiology and Pulm/CCM teams.    Magdalina Whitehead H 11/14/2012 9:23 AM

## 2012-11-14 NOTE — Discharge Summary (Signed)
Discharge Summary   Patient ID: [name],  MRN: [id number], DOB/AGE: July 29, 1956 57 y.o.  Admit date: 11/07/2012 Discharge date: 11/14/2012  Primary Physician: Pearson Grippe, MD Primary Cardiologist: J. Hochrein, MD  Discharge Diagnoses Principal Problem:   Hemorrhagic pericardial effusion Active Problems:   HTN (hypertension)   Dyspnea   Atrial fibrillation   Antiphospholipid antibody positive   Chronic anticoagulation   CREST syndrome   Bilateral pulmonary infiltrates on CXR   Bilateral pleural effusion   Allergies Allergies  Allergen Reactions  . Codeine Nausea And Vomiting  . Demerol   . Ultram (Tramadol Hcl) Other (See Comments)    Makes her hands shake.    Diagnostic Studies/Procedures  PA/LATERAL CHEST X-RAY - 11/07/12  IMPRESSION:  Borderline cardiomegaly with vascular congestion and small pleural  effusions.   CT-ANGIO CHEST - 11/07/12  IMPRESSION:  No evidence of significant pulmonary embolus. Large pericardial effusion appears loculated in the right anteriorly and causing  constriction of the right ventricle. Bilateral pleural effusions, greater on the right, with atelectasis in both lung bases. Esophageal dilatation suggesting achalasia or dysmotility. Stable large left thyroid goiter.  PORTABLE CHEST X-RAY - 11/08/12  IMPRESSION:  1. Right central venous line in proper position.  2. Mild cardiomegaly.  3. Mild left atelectasis.   TRANSTHORACIC ECHOCARDIOGRAM - 11/08/12  Study Conclusions  - Left ventricle: The cavity size was normal. Wall thickness was normal. Systolic function was mildly reduced. The estimated ejection fraction was in the range of 45% to 50%. Regional wall motion abnormalities cannot be excluded. - Left atrium: The atrium was mildly dilated. - Right ventricle: The cavity size was severely decreased. - Atrial septum: There was an atrial septal aneurysm. - Pericardium, extracardiac: A large, loculated pericardial effusion was  identified anterior to the heart.  Impressions:  - LV function difficult to assess but appears to be mildly reduced; Large  loculated anterior pericardial effusion; severe RV compression noted. Suggest repeat study after pericardial window.  PA/LATERAL CHEST X-RAY - 11/10/12  IMPRESSION:  Increase in right lower lung atelectasis and associated small right pleural effusion. Stable left lower lobe atelectasis.  PORTABLE CHEST X-RAY - 11/11/12  IMPRESSION:  Bilateral airspace opacities in a pulmonary edema pattern are worse. Stable small pleural effusions.   LOWER EXTREMITY VENOUS DUPLEX U/S - 11/11/12  - No evidence of deep vein thrombosis involving the right lower extremity. - No evidence of deep vein thrombosis involving the left lower extremity.  PA/LATERAL CHEST X-RAY - 11/12/12  IMPRESSION:  Mild bilateral pleural effusions. Minimal subsegmental atelectasis seen in lingular region. No other significant abnormality seen.  History of Present Illness [name] is a 57 y.o. female who was admitted to [facility] on 11/07/12 with the above problem list.  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 She reported experiencing exertional shortness of breath and fatigue occurring for quite some time. 2 weeks prior to admission, the patient unfortunately suffered a fall at work resulting in significant injury with fracture of her right wrist and clavicle. She fell forward and caught herself with her right arm and also landed on her chest and hit her head. She stayed out of work for a couple of weeks only recently returning to light-duty.  Several days prior to admission, she began to develop worsening shortness of  breath progressing fairly acutely  within the prior 3 days of admission. She subsequently presented to the emergency department on 11/07/12 complaining of severe shortness of breath and hypoxemia.   A PA/lateral chest x-ray indicated borderline cardiomegaly with vascular congestion and small pleural effusions. A CT angiogram of the chest was performed demonstrating large loculated pericardial effusion with evidence suggestive of compression of the right ventricle. Her INR was noted to be supratherapeutic at 3.8. She was suspected to have acquired a traumatic, hemorrhagic pericardial effusion in the setting of her recent fall. She remained hemodynamically stable in the emergency department. She was made n.p.o., administer gentle IV fluids, and typed and screened for 4 units of packed red blood cells in anticipation for the OR to undergo pericardial window. Antihypertensives were held. TSH returned within normal limits. She was admitted to the step down unit.  Hospital Course   Cardiothoracic surgery was consulted the following morning and agreed that emergent pericardial window was appropriate. The plan was discussed with the patient who agreed to proceed. Unfortunately, the patient did not receive vitamin K or fresh frozen plasma the evening before, and this was administered to reverse her Coumadin-induced coagulopathy. She was found to be normotensive prior to undergoing the procedure and was resumed on home antihypertensives. A formal 2-D echocardiogram was performed as above which revealed LVEF 45-50%, mild LA dilatation, severely decreased RV size, atrial septal aneurysm and a large, loculated pericardial effusion identified anterior to the heart.  She was informed, consented and prepped for the procedure. For full details please see the operative report. A sub-xiphoid, then pericardial incision was made and a large amont of grossly bloody fluid was immediately evacuated. Portions of the fluid were sent for labwork, culture and cytology. A  total amount of ~ 400 mL was evacuated. She tolerated the procedure well without complications. Anticoagulation was avoided for 48 hours post-op to allow for adequate hemostasis. SCDs were added.   She remained stable the following morning. The pericardial drain continued to remove a small amount of serosanguinous fluid. She was soon started on IV heparin with Coumadin bridging given her significant risk of thromboembolic events. This was managed by pharmacy.  Pericardial fluid analysis revealed no organism growth, few WBCs, benign cytology and pericardium biopsy findings.   She did develop volume overload and was diuresed with improvement. As above, a CXR indicated pulmonary infiltrates, CBC revealed leukocytosis, and given the concern for developing HCAP or aspiration pneumonitis, the pulmonary/CCM team was consulted. She was placed on BS antibiotics. Her reflux symptoms did worsen shortly post-op and there was increased concern for aspiration. She was placed on appropriate precautions. Low-dose short term Reglan was added to her BID PPI regimen for this.  She was noted to be transiently febrile that evening after being started on antibiotics. This was suspected to be post-op atelectasis and improved with acetaminophen.   Lower extremity dopplers were ordered which indicated no evidence of DVT/SVT or Baker's cyst. Her chest tube was removed without incident. Diuresis was continued and a repeat CXR as above indicated improved aeration from the prior radiograph. Flutter valve was provided with further improvement. Lasix was transitioned to PO.   The evening prior to admission, she had a self-limiting paroxysm of atrial fibrillation which was responsive to diltiazem gtt. She was evaluated by Dr. Antoine Poche this AM. The patient wished to go home, and Dr. Antoine Poche deemed her stable for discharge.   After confirming safety with TCTS, she will resume Lovenox bridging to Coumadin. INR  is checked at her PCP's  office. This will be checked on Monday and further recommendations made at that time. Coumadin dosing has been adjusted to allow for the patient's INR to become therapeutic based on pharmacy's recommendations. She will resume three days of Levaquin for pulm/CCM recommendations. Given labile BPs near discharge, previous antihypertensives were held. She will be discharged on low-dose Lopressor for rate-control as noted below. She will also be discharged on Lasix PO. A repeat echocardiogram has been scheduled in 2 weeks as an outpatient. She will otherwise follow-up as noted below and recommended this admission. This information, including supplemental discharge instructions, has been clearly outlined in the discharge.    Discharge Vitals:  Blood pressure 126/64, pulse 81, temperature 98.2 F (36.8 C), temperature source Oral, resp. rate 16, height 5\' 6"  (1.676 m), weight 82.5 kg (181 lb 14.1 oz), SpO2 96.00%.  Weight change: 0.2 kg (7.1 oz)  Labs: Recent Labs     11/12/12  0400  11/14/12  0507  WBC  9.6  5.5  HGB  9.8*  9.7*  HCT  29.4*  29.3*  MCV  88.6  86.9  PLT  401*  377   Recent Labs Lab 11/10/12 0459 11/11/12 0454 11/12/12 0400 11/14/12 0507  NA 134* 135 139 138  K 3.3* 3.9 3.6 3.3*  CL 101 100 102 105  CO2 26 28 29 24   BUN 6 9 8  5*  CREATININE 0.57 0.62 0.70 0.69  CALCIUM 9.0 9.3 9.6 9.4  PROT 5.9*  --   --   --   BILITOT 0.6  --   --   --   ALKPHOS 130*  --   --   --   ALT 18  --   --   --   AST 14  --   --   --   GLUCOSE 109* 95 101* 101*   Disposition:  Discharge Orders   Future Appointments Provider Department Dept Phone   11/21/2012 3:45 PM Rosalio Macadamia, NP [facility] [place]) [phone]   11/21/2012 4:00 PM [facility] [place]) [phone]   11/28/2012 9:30 AM [facility] [place] SITE 3 ECHO LAB [phone]   Future Orders Complete By Expires     Diet - low sodium heart healthy   As directed     Increase activity slowly  As directed       Follow-up Information   Follow up with Norma Fredrickson, NP On 11/21/2012. (At 3:45 PM for post-hospital follow-up and labwork. )    Contact information:   [address] [place] [address] [phone]       Follow up with [facility] On 11/28/2012. (At 9:30 AM for repeat echocardiogram. )    Contact information:   [address] [place] [address]       Follow up with Pearson Grippe, MD On 11/18/2012. (INR check. )       Discharge Medications:    Medication List    STOP taking these medications       HYDROcodone-acetaminophen 5-325 MG per tablet  Commonly known as:  NORCO/VICODIN     losartan 50 MG tablet  Commonly known as:  COZAAR     NIFEdipine 30 MG 24 hr tablet  Commonly known as:  PROCARDIA-XL/ADALAT CC      TAKE these medications       ACTONEL PO  Take 1 tablet by mouth every 30 (thirty)  days.     ALREX 0.2 % Susp  Generic drug:  loteprednol  Place 1 drop into both eyes 4 (four) times daily.     CALCIUM + D PO  Take 1 tablet by mouth daily.     CYCLOBENZAPRINE HCL PO  Take 1 tablet by mouth at bedtime as needed (muscle spasms).     enoxaparin 40 MG/0.4ML injection  Commonly known as:  LOVENOX  Inject 0.4 mLs (40 mg total) into the skin daily.     esomeprazole 40 MG capsule  Commonly known as:  NEXIUM  Take 40 mg by mouth 2 (two) times daily.     furosemide 40 MG tablet  Commonly known as:  LASIX  Take 1 tablet (40 mg total) by mouth daily.     gabapentin 100 MG capsule  Commonly known as:  NEURONTIN  Take 400 mg by mouth 2 (two) times daily.     ipratropium 0.06 % nasal spray  Commonly known as:  ATROVENT  Place 2 sprays into the nose 4 (four) times daily as needed for rhinitis.     levofloxacin 750 MG tablet  Commonly known as:  LEVAQUIN  Take 1 tablet (750 mg total) by mouth daily.  Start taking on:  11/15/2012     metoprolol tartrate 12.5  mg Tabs  Commonly known as:  LOPRESSOR  Take 0.5 tablets (12.5 mg total) by mouth 2 (two) times daily.     mometasone 50 MCG/ACT nasal spray  Commonly known as:  NASONEX  Place 2 sprays into the nose every morning.     oxyCODONE 5 MG immediate release tablet  Commonly known as:  Oxy IR/ROXICODONE  Take 1-2 tablets (5-10 mg total) by mouth every 3 (three) hours as needed.     Potassium Chloride ER 20 MEQ Tbcr  Take 20 mEq by mouth daily.     progesterone 100 MG capsule  Commonly known as:  PROMETRIUM  Take 100 mg by mouth at bedtime.     rifaximin 550 MG Tabs  Commonly known as:  XIFAXAN  Take 550 mg by mouth daily.     SYSTANE ULTRA OP  Apply 1 drop to eye 4 (four) times daily as needed (dry eyes).     warfarin 5 MG tablet  Commonly known as:  COUMADIN  Take 1.5 tablets (7.5 mg total) by mouth daily.  Start taking on:  11/15/2012       Outstanding Labs/Studies: 2D echocardiogram on 11/28/12  Duration of Discharge Encounter: Greater than 30 minutes including physician time.  Signed, R. Hurman Horn, PA-C 11/14/2012, 1:13 PM

## 2012-11-14 NOTE — Progress Notes (Signed)
Patient being discharged home per MD order. All discharge instruction gone over and patient verbalized understanding. Patient will go home with her husband.

## 2012-11-16 LAB — C2 COMPLEMENT: C2 Complement: 2.1 mg/dL (ref 1.6–3.5)

## 2012-11-18 ENCOUNTER — Telehealth: Payer: Self-pay | Admitting: *Deleted

## 2012-11-18 NOTE — Telephone Encounter (Signed)
TCM Patient:  Aware of appt with Norma Fredrickson, NP on 11/22/2012  C/o nagging/sharp pain to right rib area.  Feels better when she presses her arm against her side.  Advised pt to continue with incentive spirometer from hospital and try splinting with a pillow for coughing and deep breathing.  So tender, pt cannot sleep on her right side.  Will pass along to Dr. Antoine Poche and Lawson Fiscal for further suggestions.

## 2012-11-21 ENCOUNTER — Other Ambulatory Visit: Payer: 59

## 2012-11-21 ENCOUNTER — Encounter: Payer: 59 | Admitting: Nurse Practitioner

## 2012-11-22 ENCOUNTER — Ambulatory Visit (INDEPENDENT_AMBULATORY_CARE_PROVIDER_SITE_OTHER): Payer: 59 | Admitting: Nurse Practitioner

## 2012-11-22 ENCOUNTER — Encounter: Payer: Self-pay | Admitting: Nurse Practitioner

## 2012-11-22 VITALS — BP 124/70 | HR 74 | Ht 66.0 in | Wt 165.4 lb

## 2012-11-22 DIAGNOSIS — I319 Disease of pericardium, unspecified: Secondary | ICD-10-CM

## 2012-11-22 DIAGNOSIS — I313 Pericardial effusion (noninflammatory): Secondary | ICD-10-CM

## 2012-11-22 LAB — BASIC METABOLIC PANEL
BUN: 14 mg/dL (ref 6–23)
CO2: 26 mEq/L (ref 19–32)
Calcium: 10.3 mg/dL (ref 8.4–10.5)
Chloride: 104 mEq/L (ref 96–112)
Creatinine, Ser: 0.8 mg/dL (ref 0.4–1.2)
GFR: 79.38 mL/min (ref >60.00)
Glucose, Bld: 93 mg/dL (ref 70–99)
Potassium: 3.8 mEq/L (ref 3.5–5.1)
Sodium: 139 mEq/L (ref 135–145)

## 2012-11-22 LAB — CBC WITH DIFFERENTIAL/PLATELET
Basophils Absolute: 0.1 10*3/uL (ref 0.0–0.1)
Basophils Relative: 0.4 % (ref 0.0–3.0)
Eosinophils Absolute: 0.3 10*3/uL (ref 0.0–0.7)
Eosinophils Relative: 2.8 % (ref 0.0–5.0)
HCT: 38.4 % (ref 36.0–46.0)
Hemoglobin: 12.3 g/dL (ref 12.0–15.0)
Lymphocytes Relative: 10.3 % — ABNORMAL LOW (ref 12.0–46.0)
Lymphs Abs: 1.2 10*3/uL (ref 0.7–4.0)
MCHC: 31.9 g/dL (ref 30.0–36.0)
MCV: 89.7 fl (ref 78.0–100.0)
Monocytes Absolute: 1.3 10*3/uL — ABNORMAL HIGH (ref 0.1–1.0)
Monocytes Relative: 11.1 % (ref 3.0–12.0)
Neutro Abs: 8.8 10*3/uL — ABNORMAL HIGH (ref 1.4–7.7)
Neutrophils Relative %: 75.4 % (ref 43.0–77.0)
Platelets: 575 10*3/uL — ABNORMAL HIGH (ref 150.0–400.0)
RBC: 4.28 Mil/uL (ref 3.87–5.11)
RDW: 15.2 % — ABNORMAL HIGH (ref 11.5–14.6)
WBC: 11.7 10*3/uL — ABNORMAL HIGH (ref 4.5–10.5)

## 2012-11-22 NOTE — Patient Instructions (Addendum)
We need to check labs today.   Increase activity slowly  Will get your echo later this month as planned and see Dr. Antoine Poche in follow up  Keep an eye on your incision. You may apply Neosporin prn.   Call the [facility] office at [phone] if you have any questions, problems or concerns.

## 2012-11-22 NOTE — Progress Notes (Addendum)
[name] Date of Birth: October 29, 1955 Medical Record #[id number]  History of Present Illness: [name] is seen back today for a post hospital visit. She is seen for Dr. Antoine Poche. 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   She was most recently admitted with worsening shortness of breath and fatigue. She had suffered a fall prior. She has fractured her right wrist and clavicle. Then developed a hemorrhagic pericardial effusion requiring surgical intervention per Dr. Cornelius Moras.   She comes in today. She has been home just a week. Slowly making progress. She still has some right sided chest/lumbar soreness - worse with lying down but certainly improving. No cough. Rhythm has been ok. Off her lovenox now. INR on Monday was 3.0 by her PCP. Incision is a little red but not draining. Still with a suture in place. Has her echo scheduled for the 24th. She remains out of work.   Current Outpatient Prescriptions on File Prior to Visit  Medication Sig Dispense Refill  . Calcium Carbonate-Vitamin D (CALCIUM + D PO) Take 1 tablet by mouth daily.      Marland Kitchen esomeprazole (NEXIUM) 40 MG capsule Take 40 mg by mouth 2 (two) times daily.      . furosemide (LASIX) 40 MG tablet Take 1 tablet (40 mg total) by mouth daily.  30 tablet  3  . gabapentin (NEURONTIN) 100 MG capsule Take 400 mg by mouth 2 (two) times daily.       Marland Kitchen ipratropium (ATROVENT) 0.06 % nasal spray Place 2 sprays into the nose 4 (four) times daily as needed for rhinitis.      Marland Kitchen loteprednol (ALREX) 0.2 % SUSP Place 1 drop into both eyes 4 (four) times daily.      . metoprolol tartrate (LOPRESSOR) 12.5 mg TABS Take 0.5 tablets (12.5 mg total) by mouth 2 (two) times daily.  30 tablet  3  . mometasone (NASONEX) 50 MCG/ACT nasal spray Place 2 sprays into the nose every morning.      Marland Kitchen oxyCODONE (OXY IR/ROXICODONE) 5 MG immediate  release tablet Take 1-2 tablets (5-10 mg total) by mouth every 3 (three) hours as needed.  60 tablet  0  . Polyethyl Glycol-Propyl Glycol (SYSTANE ULTRA OP) Apply 1 drop to eye 4 (four) times daily as needed (dry eyes).      . potassium chloride 20 MEQ TBCR Take 20 mEq by mouth daily.  30 tablet  3  . progesterone (PROMETRIUM) 100 MG capsule Take 100 mg by mouth at bedtime.       . rifaximin (XIFAXAN) 550 MG TABS Take 550 mg by mouth daily.      . Risedronate Sodium (ACTONEL PO) Take 1 tablet by mouth every 30 (thirty) days.      Marland Kitchen warfarin (COUMADIN) 5 MG tablet Take 1.5 tablets (7.5 mg total) by mouth daily.  30 tablet  3  . CYCLOBENZAPRINE HCL PO Take 1 tablet by mouth at bedtime as needed (muscle spasms).      . enoxaparin (LOVENOX) 40 MG/0.4ML injection Inject 0.4 mLs (40 mg total) into the skin daily.  14 Syringe  0   No current facility-administered medications on file prior to visit.    Allergies  Allergen Reactions  . Codeine Nausea And Vomiting  . Demerol   . Ultram (Tramadol Hcl) Other (See Comments)    Makes her hands shake.    Past  Medical History  Diagnosis Date  . DVT (deep venous thrombosis)   . CREST syndrome   . Pericardial effusion   . MVP (mitral valve prolapse)   . Antiphospholipid antibody syndrome   . ITP (idiopathic thrombocytopenic purpura)   . Raynaud's phenomenon   . Hypertension   . Paroxysmal atrial fibrillation   . Paroxysmal atrial fibrillation 09/18/2011  . Antiphospholipid antibody positive 10/10/2012  . Hemorrhagic pericardial effusion 11/07/2012  . CREST syndrome 11/08/2012  . 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 Thyroid goiter     Past Surgical History  Procedure Laterality Date  . Splenectomy      ITP  . Uterine ablation    . Tubal ligation    . Subxyphoid pericardial window N/A 11/08/2012    Procedure: SUBXYPHOID PERICARDIAL WINDOW;  Surgeon: Purcell Nails, MD;  Location: [facility] OR;  Service: Thoracic;  Laterality: N/A;    History    Smoking status  . Never Smoker   Smokeless tobacco  . Never Used    History  Alcohol Use No    Family History  Problem Relation Age of Onset  . Cancer Mother     Lung  . Diabetes Father   . COPD Father   . Hyperlipidemia Father   . Hypertension Father     Review of Systems: The review of systems is per the HPI.  All other systems were reviewed and are negative.  Physical Exam: BP 124/70  Pulse 74  Ht 5\' 6"  (1.676 m)  Wt 165 lb 6 oz (75.014 kg)  BMI 26.71 kg/m2  SpO2 96% Patient is very pleasant and in no acute distress. Skin is warm and dry. Color is normal.  HEENT is unremarkable. Normocephalic/atraumatic. PERRL. Sclera are nonicteric. Neck is supple. No masses. No JVD. Lungs are clear. Cardiac exam shows a regular rate and rhythm.No rub noted.  Abdomen is soft. Extremities are without edema. Right arm in a splint. Gait and ROM are intact. No gross neurologic deficits noted.  LABORATORY DATA: Pending for today.  EKG today shows sinus rhythm with diffuse ST and T wave changes but with more anterior T wave changes. Will ask Dr. Antoine Poche to review.   Lab Results  Component Value Date   WBC 5.5 11/14/2012   HGB 9.7* 11/14/2012   HCT 29.3* 11/14/2012   PLT 377 11/14/2012   GLUCOSE 101* 11/14/2012   CHOL  Value: 130        ATP III CLASSIFICATION:  <200     mg/dL   Desirable  161-096  mg/dL   Borderline High  >=045    mg/dL   High        11/13/8117   TRIG 36 11/20/2008   HDL 48 11/20/2008   LDLCALC  Value: 75        Total Cholesterol/HDL:CHD Risk Coronary Heart Disease Risk Table                     Men   Women  1/2 Average Risk   3.4   3.3  Average Risk       5.0   4.4  2 X Average Risk   9.6   7.1  3 X Average Risk  23.4   11.0        Use the calculated Patient Ratio above and the CHD Risk Table to determine the patient's CHD Risk.        ATP III CLASSIFICATION (LDL):  <100     mg/dL  Optimal  100-129  mg/dL   Near or Above                    Optimal  130-159  mg/dL   Borderline   161-096  mg/dL   High  >045     mg/dL   Very High 11/13/8117   ALT 18 11/10/2012   AST 14 11/10/2012   NA 138 11/14/2012   K 3.3* 11/14/2012   CL 105 11/14/2012   CREATININE 0.69 11/14/2012   BUN 5* 11/14/2012   CO2 24 11/14/2012   TSH 0.907 11/08/2012   INR 1.46 11/14/2012    Assessment / Plan: 1. Hemorrhagic pericardial effusion - s/p window - slow but steady progress. Incision is just a little red but not draining. I took out her suture today. She does not have follow up planned with Dr. Cornelius Moras but I will let him know that I saw her today. Her echo is for later this month. Will get her back to see Dr. Antoine Poche as well. We will recheck her labs today as well.   2. PAF - in sinus today by EKG. She notes no recurrence since discharge.   3. CREST  4. Abnormal EKG - will need repeat on return.   Patient is agreeable to this plan and will call if any problems develop in the interim.   Rosalio Macadamia, RN, ANP-C [facility] [address] [place], [place]  [address]

## 2012-11-28 ENCOUNTER — Ambulatory Visit (HOSPITAL_COMMUNITY): Payer: 59 | Attending: Cardiology

## 2012-11-28 ENCOUNTER — Other Ambulatory Visit (HOSPITAL_COMMUNITY): Payer: 59

## 2012-11-28 ENCOUNTER — Encounter: Payer: Self-pay | Admitting: Cardiology

## 2012-11-28 ENCOUNTER — Ambulatory Visit (INDEPENDENT_AMBULATORY_CARE_PROVIDER_SITE_OTHER): Payer: 59 | Admitting: Cardiology

## 2012-11-28 VITALS — BP 122/70 | HR 74 | Ht 66.0 in | Wt 164.8 lb

## 2012-11-28 DIAGNOSIS — I480 Paroxysmal atrial fibrillation: Secondary | ICD-10-CM

## 2012-11-28 DIAGNOSIS — I313 Pericardial effusion (noninflammatory): Secondary | ICD-10-CM

## 2012-11-28 DIAGNOSIS — M349 Systemic sclerosis, unspecified: Secondary | ICD-10-CM | POA: Insufficient documentation

## 2012-11-28 DIAGNOSIS — I10 Essential (primary) hypertension: Secondary | ICD-10-CM | POA: Insufficient documentation

## 2012-11-28 DIAGNOSIS — I82409 Acute embolism and thrombosis of unspecified deep veins of unspecified lower extremity: Secondary | ICD-10-CM

## 2012-11-28 DIAGNOSIS — I319 Disease of pericardium, unspecified: Secondary | ICD-10-CM

## 2012-11-28 DIAGNOSIS — R0989 Other specified symptoms and signs involving the circulatory and respiratory systems: Secondary | ICD-10-CM | POA: Insufficient documentation

## 2012-11-28 DIAGNOSIS — R0609 Other forms of dyspnea: Secondary | ICD-10-CM | POA: Insufficient documentation

## 2012-11-28 DIAGNOSIS — Z86718 Personal history of other venous thrombosis and embolism: Secondary | ICD-10-CM | POA: Insufficient documentation

## 2012-11-28 DIAGNOSIS — I059 Rheumatic mitral valve disease, unspecified: Secondary | ICD-10-CM | POA: Insufficient documentation

## 2012-11-28 DIAGNOSIS — I4891 Unspecified atrial fibrillation: Secondary | ICD-10-CM

## 2012-11-28 DIAGNOSIS — I7300 Raynaud's syndrome without gangrene: Secondary | ICD-10-CM | POA: Insufficient documentation

## 2012-11-28 DIAGNOSIS — R5381 Other malaise: Secondary | ICD-10-CM | POA: Insufficient documentation

## 2012-11-28 NOTE — Progress Notes (Signed)
Echocardiogram performed.  

## 2012-11-28 NOTE — Patient Instructions (Addendum)
The current medical regimen is effective;  continue present plan and medications.  Your physician recommends that you schedule a follow-up appointment in: 4 month

## 2012-11-28 NOTE — Progress Notes (Signed)
HPI The patient presents for an add-on evaluation of hemorrhagic pericardial effusion. She recently suffered a fall and on anticoagulation had bleeding into her pericardium requiring window. She did have a brief run of paroxysmal atrial fibrillation during this hospitalization.  She is making a nice recovery from this. 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  She's not noticed any symptomatic tachypalpitations, presyncope or syncope. She's had none of the shortness of breath she had with her effusion. She's had only minimal incisional infusion. She has had no cough fevers or chills.  Allergies  Allergen Reactions  . Codeine Nausea And Vomiting  . Demerol   . Ultram (Tramadol Hcl) Other (See Comments)    Makes her hands shake.    Current Outpatient Prescriptions  Medication Sig Dispense Refill  . Calcium Carbonate-Vitamin D (CALCIUM + D PO) Take 1 tablet by mouth daily.      Marland Kitchen esomeprazole (NEXIUM) 40 MG capsule Take 40 mg by mouth 2 (two) times daily.      . furosemide (LASIX) 40 MG tablet Take 1 tablet (40 mg total) by mouth daily.  30 tablet  3  . gabapentin (NEURONTIN) 100 MG capsule Take 400 mg by mouth 2 (two) times daily.       Marland Kitchen ipratropium (ATROVENT) 0.06 % nasal spray Place 2 sprays into the nose 4 (four) times daily as needed for rhinitis.      Marland Kitchen loteprednol (ALREX) 0.2 % SUSP Place 1 drop into both eyes 4 (four) times daily.      . metoprolol tartrate (LOPRESSOR) 12.5 mg TABS Take 0.5 tablets (12.5 mg total) by mouth 2 (two) times daily.  30 tablet  3  . mometasone (NASONEX) 50 MCG/ACT nasal spray Place 2 sprays into the nose every morning.      Bertram Gala Glycol-Propyl Glycol (SYSTANE ULTRA OP) Apply 1 drop to eye 4 (four) times daily as needed (dry eyes).      . potassium chloride 20 MEQ TBCR Take 20 mEq by mouth daily.  30 tablet  3  . progesterone (PROMETRIUM) 100 MG capsule Take 100 mg by mouth at bedtime.       . rifaximin (XIFAXAN) 550 MG TABS Take 550 mg by mouth  daily.      . Risedronate Sodium (ACTONEL PO) Take 1 tablet by mouth every 30 (thirty) days.      Marland Kitchen warfarin (COUMADIN) 5 MG tablet Take 1.5 tablets (7.5 mg total) by mouth daily.  30 tablet  3   No current facility-administered medications for this visit.    Past Medical History  Diagnosis Date  . DVT (deep venous thrombosis)   . CREST syndrome   . Pericardial effusion   . MVP (mitral valve prolapse)   . Antiphospholipid antibody syndrome   . ITP (idiopathic thrombocytopenic purpura)   . Raynaud's phenomenon   . Hypertension   . Paroxysmal atrial fibrillation   . Paroxysmal atrial fibrillation 09/18/2011  . Antiphospholipid antibody positive 10/10/2012  . Hemorrhagic pericardial effusion 11/07/2012  . CREST syndrome 11/08/2012  . 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 Thyroid goiter     Past Surgical History  Procedure Laterality Date  . Splenectomy      ITP  . Uterine ablation    . Tubal ligation    . Subxyphoid pericardial window N/A 11/08/2012    Procedure: SUBXYPHOID PERICARDIAL WINDOW;  Surgeon: Purcell Nails, MD;  Location: [facility] OR;  Service: Thoracic;  Laterality: N/A;   ROS:  As  stated in the HPI and negative for all other systems.  PHYSICAL EXAM BP 122/70  Pulse 74  Ht 5\' 6"  (1.676 m)  Wt 164 lb 12.8 oz (74.753 kg)  BMI 26.61 kg/m2 GENERAL:  Well appearing HEENT:  Pupils equal round and reactive, fundi not visualized, oral mucosa unremarkable NECK:  No jugular venous distention, waveform within normal limits, carotid upstroke brisk and symmetric, no bruits, no thyromegaly LYMPHATICS:  No cervical, inguinal adenopathy LUNGS:  Clear to auscultation bilaterally BACK:  No CVA tenderness CHEST:  Unremarkable HEART:  PMI not displaced or sustained,S1 and S2 within normal limits, no S3, no S4, no clicks, no rubs, no murmurs ABD:  Flat, positive bowel sounds normal in frequency in pitch, no bruits, no rebound, no guarding, no midline pulsatile mass, no hepatomegaly,  no splenomegaly EXT:  2 plus pulses throughout, no edema, no cyanosis no clubbing SKIN:  No rashes no nodules NEURO:  Cranial nerves II through XII grossly intact, motor grossly intact throughout PSYCH:  Cognitively intact, oriented to person place and time  EKG:  Sinus rhythm, rate 74, axis within normal limits, nonspecific diffuse T wave flattening, no acute ST-T wave changes. 11/28/2012   ASSESSMENT AND PLAN  PERICARDIAL EFFUSION:  The patient had a pericardial effusion that was clearly related to trauma related to her fall at work. She's done well since her pericardial window. I looked at an echocardiogram done today and she has no significant residual or recurrent effusion. She will remain on anticoagulation. No other change in therapy is indicated.  REYNAUDS:  The patient will resume her Procardia XL for this.  ATRIAL FIBRILLATION:  She has had no recurrent symptomatic tachycardia arrhythmias. She will remain on anticoagulation.  She will remain off aspirin.

## 2012-11-30 ENCOUNTER — Encounter (HOSPITAL_COMMUNITY): Payer: Self-pay | Admitting: Emergency Medicine

## 2012-11-30 ENCOUNTER — Emergency Department (HOSPITAL_COMMUNITY)
Admission: EM | Admit: 2012-11-30 | Discharge: 2012-11-30 | Disposition: A | Discharge status: Home / Self Care | Payer: 59 | Attending: Emergency Medicine | Admitting: Emergency Medicine

## 2012-11-30 DIAGNOSIS — Z7901 Long term (current) use of anticoagulants: Secondary | ICD-10-CM | POA: Insufficient documentation

## 2012-11-30 DIAGNOSIS — Z8639 Personal history of other endocrine, nutritional and metabolic disease: Secondary | ICD-10-CM | POA: Insufficient documentation

## 2012-11-30 DIAGNOSIS — I82629 Acute embolism and thrombosis of deep veins of unspecified upper extremity: Secondary | ICD-10-CM | POA: Insufficient documentation

## 2012-11-30 DIAGNOSIS — Z79899 Other long term (current) drug therapy: Secondary | ICD-10-CM | POA: Insufficient documentation

## 2012-11-30 DIAGNOSIS — Z8679 Personal history of other diseases of the circulatory system: Secondary | ICD-10-CM | POA: Insufficient documentation

## 2012-11-30 DIAGNOSIS — I10 Essential (primary) hypertension: Secondary | ICD-10-CM | POA: Insufficient documentation

## 2012-11-30 DIAGNOSIS — Z862 Personal history of diseases of the blood and blood-forming organs and certain disorders involving the immune mechanism: Secondary | ICD-10-CM | POA: Insufficient documentation

## 2012-11-30 DIAGNOSIS — I82621 Acute embolism and thrombosis of deep veins of right upper extremity: Secondary | ICD-10-CM

## 2012-11-30 LAB — CBC WITH DIFFERENTIAL/PLATELET
Basophils Absolute: 0.1 10*3/uL (ref 0.0–0.1)
Basophils Relative: 1 % (ref 0–1)
HCT: 37.2 % (ref 36.0–46.0)
Lymphocytes Relative: 8 % — ABNORMAL LOW (ref 12–46)
MCHC: 32.5 g/dL (ref 30.0–36.0)
Monocytes Absolute: 1.4 10*3/uL — ABNORMAL HIGH (ref 0.1–1.0)
Neutro Abs: 11.8 10*3/uL — ABNORMAL HIGH (ref 1.7–7.7)
Neutrophils Relative %: 77 % (ref 43–77)
Platelets: 475 10*3/uL — ABNORMAL HIGH (ref 150–400)
RDW: 14.7 % (ref 11.5–15.5)
WBC: 15.4 10*3/uL — ABNORMAL HIGH (ref 4.0–10.5)

## 2012-11-30 LAB — URINALYSIS, ROUTINE W REFLEX MICROSCOPIC
Bilirubin Urine: NEGATIVE
Glucose, UA: NEGATIVE mg/dL
Hgb urine dipstick: NEGATIVE
Specific Gravity, Urine: 1.01 (ref 1.005–1.030)
Urobilinogen, UA: 0.2 mg/dL (ref 0.0–1.0)

## 2012-11-30 LAB — BASIC METABOLIC PANEL
CO2: 25 mEq/L (ref 19–32)
Chloride: 100 mEq/L (ref 96–112)
GFR calc Af Amer: 83 mL/min — ABNORMAL LOW (ref >90)
Potassium: 3.7 mEq/L (ref 3.5–5.1)
Sodium: 137 mEq/L (ref 135–145)

## 2012-11-30 LAB — PROTIME-INR
INR: 2.48 — ABNORMAL HIGH (ref 0.00–1.49)
Prothrombin Time: 25.7 seconds — ABNORMAL HIGH (ref 11.6–15.2)

## 2012-11-30 MED ORDER — RIVAROXABAN 20 MG PO TABS: 20.0000 mg | ORAL_TABLET | Freq: Every day | ORAL | Status: DC

## 2012-11-30 MED ORDER — RIVAROXABAN 15 MG PO TABS: ORAL_TABLET | ORAL | Status: DC

## 2012-11-30 MED ORDER — HYDROCODONE-ACETAMINOPHEN 5-325 MG PO TABS: 1.0000 | ORAL_TABLET | Freq: Four times a day (QID) | ORAL | Status: DC | PRN

## 2012-11-30 NOTE — ED Notes (Signed)
Pt had pericardial window procedure around 04/04.  Pt was on coumadin and they reversed it for the surgery.  Pt sent home with Lovenox Rx and coumadin.  Pt is finished with Lovenox and continues to take coumadin.  INR check on Monday was 3.1.  Pt has obvious swelling and redness to Right arm.  Pt states that pain starts in axilla and moves down the arm.

## 2012-11-30 NOTE — ED Notes (Signed)
Pt. Stated, Thursday I started having some tingling on my rt. Arm.. I fell in March and had to wear a splint for a broken wrist and collar bone.  Had a procedure for fluid build up also.  Given coumadin, INR on Monday was 3.1.  I think maybe I have a clot cause I'm having pain underneath my rt. Arm.

## 2012-11-30 NOTE — ED Provider Notes (Signed)
History     CSN: [id number]  Arrival date & time 11/30/12  0909   First MD Initiated Contact with Patient 11/30/12 1007      Chief Complaint  Patient presents with  . Arm Pain    (Consider location/radiation/quality/duration/timing/severity/associated sxs/prior treatment) HPI  57 year old female presents to the emergency department with chief complaint of right arm swelling and pain.  His past medical history of crest syndrome, lipase, antiphospholipid antibody and DVT.  Patient recently had a ORIF of her right wrist and clavicle.  She also had a large pericardial effusion and pericardial window placement approximately 3 weeks ago.  The patient has been doing well up until this yesterday when she began developing tingling and numbness in her right arm.  When she awoke with significant swelling and pain in her in her right arm. Patient is on Coumadin and had an INR on Monday that is 3.1.  Her physician did drop her Coumadin levels this past week.  Patient also had a 2-D echocardiogram done his past week which was normal. Denies fevers, chills, myalgias, arthralgias. Denies DOE, SOB, chest tightness or pressure, radiation to left arm, jaw or back, or diaphoresis. Denies dysuria, flank pain, suprapubic pain, frequency, urgency, or hematuria. Denies headaches, light headedness, weakness, visual disturbances. Denies abdominal pain, nausea, vomiting, diarrhea or constipation.     Past Medical History  Diagnosis Date  . DVT (deep venous thrombosis)   . CREST syndrome   . Pericardial effusion   . MVP (mitral valve prolapse)   . Antiphospholipid antibody syndrome   . ITP (idiopathic thrombocytopenic purpura)   . Raynaud's phenomenon   . Hypertension   . Paroxysmal atrial fibrillation   . Paroxysmal atrial fibrillation 09/18/2011  . Antiphospholipid antibody positive 10/10/2012  . Hemorrhagic pericardial effusion 11/07/2012  . CREST syndrome 11/08/2012  . Recurrent deep venous thrombosis  11/08/2012  . Thyroid goiter     Past Surgical History  Procedure Laterality Date  . Splenectomy      ITP  . Uterine ablation    . Tubal ligation    . Subxyphoid pericardial window N/A 11/08/2012    Procedure: SUBXYPHOID PERICARDIAL WINDOW;  Surgeon: Purcell Nails, MD;  Location: [facility] OR;  Service: Thoracic;  Laterality: N/A;    Family History  Problem Relation Age of Onset  . Cancer Mother     Lung  . Diabetes Father   . COPD Father   . Hyperlipidemia Father   . Hypertension Father     History  Substance Use Topics  . Smoking status: Never Smoker   . Smokeless tobacco: Never Used  . Alcohol Use: No    OB History   Grav Para Term Preterm Abortions TAB SAB Ect Mult Living                  Review of Systems  Constitutional: Negative for fever and chills.  HENT: Negative for trouble swallowing.   Respiratory: Negative for shortness of breath.   Cardiovascular: Negative for chest pain and leg swelling.  Gastrointestinal: Negative for nausea, vomiting, abdominal pain, diarrhea and constipation.  Genitourinary: Negative for dysuria and hematuria.       No breast pain, nipple discharge or change in contour.  Musculoskeletal: Negative for myalgias and arthralgias.  Skin: Positive for color change. Negative for pallor and rash.  Neurological: Negative for numbness.  All other systems reviewed and are negative.    Allergies  Codeine; Demerol; and Ultram  Home Medications   Current Outpatient  Rx  Name  Route  Sig  Dispense  Refill  . Calcium Carbonate-Vitamin D (CALCIUM + D PO)   Oral   Take 1 tablet by mouth daily.         Marland Kitchen esomeprazole (NEXIUM) 40 MG capsule   Oral   Take 40 mg by mouth 2 (two) times daily.         . furosemide (LASIX) 40 MG tablet   Oral   Take 1 tablet (40 mg total) by mouth daily.   30 tablet   3   . gabapentin (NEURONTIN) 100 MG capsule   Oral   Take 400 mg by mouth 2 (two) times daily.          Marland Kitchen ipratropium (ATROVENT) 0.06  % nasal spray   Nasal   Place 2 sprays into the nose 4 (four) times daily as needed for rhinitis.         Marland Kitchen loteprednol (ALREX) 0.2 % SUSP   Both Eyes   Place 1 drop into both eyes 4 (four) times daily.         . metoprolol tartrate (LOPRESSOR) 12.5 mg TABS   Oral   Take 0.5 tablets (12.5 mg total) by mouth 2 (two) times daily.   30 tablet   3   . mometasone (NASONEX) 50 MCG/ACT nasal spray   Nasal   Place 2 sprays into the nose every morning.         Bertram Gala Glycol-Propyl Glycol (SYSTANE ULTRA OP)   Ophthalmic   Apply 1 drop to eye 4 (four) times daily as needed (dry eyes).         . potassium chloride 20 MEQ TBCR   Oral   Take 20 mEq by mouth daily.   30 tablet   3   . progesterone (PROMETRIUM) 100 MG capsule   Oral   Take 100 mg by mouth at bedtime.          . rifaximin (XIFAXAN) 550 MG TABS   Oral   Take 550 mg by mouth daily.         . Risedronate Sodium (ACTONEL PO)   Oral   Take 1 tablet by mouth every 30 (thirty) days.         Marland Kitchen warfarin (COUMADIN) 5 MG tablet   Oral   Take 2.5-5 mg by mouth daily. 5 mg daily except 2.5 mg on saturday           BP 113/67  Pulse 97  Temp(Src) 98.7 F (37.1 C) (Oral)  Resp 16  SpO2 96%  Physical Exam  Nursing note and vitals reviewed. Constitutional: She is oriented to person, place, and time. She appears well-developed and well-nourished. No distress.  HENT:  Head: Normocephalic and atraumatic.  Eyes: Conjunctivae are normal. No scleral icterus.  Neck: Normal range of motion.  Cardiovascular: Normal rate, regular rhythm, normal heart sounds and intact distal pulses.  Exam reveals no gallop and no friction rub.   No murmur heard. Tender swollen R arm. Purple tinged and warm. Engorged vessels and tenlangectasia along the surface of the arm and into the R breast. TTP in the axilla. No adenopathy. Distal pulse strong. No pallor. Normal grip strength.  Pulmonary/Chest: Effort normal and breath  sounds normal. No respiratory distress.  Well healing  Surgical scar inferior to the xyphoid. No drainage. TTP. No surrounding erythema.  Abdominal: Soft. Bowel sounds are normal. She exhibits no distension and no mass. There is no tenderness. There is  no guarding.  Neurological: She is alert and oriented to person, place, and time.  Skin: Skin is warm and dry. She is not diaphoretic.    ED Course  Procedures (including critical care time)  Labs Reviewed  CBC WITH DIFFERENTIAL - Abnormal; Notable for the following:    WBC 15.4 (*)    Platelets 475 (*)    Neutro Abs 11.8 (*)    Lymphocytes Relative 8 (*)    Monocytes Absolute 1.4 (*)    Eosinophils Absolute 0.8 (*)    All other components within normal limits  BASIC METABOLIC PANEL - Abnormal; Notable for the following:    GFR calc non Af Amer 72 (*)    GFR calc Af Amer 83 (*)    All other components within normal limits  PROTIME-INR - Abnormal; Notable for the following:    Prothrombin Time 25.7 (*)    INR 2.48 (*)    All other components within normal limits  URINALYSIS, ROUTINE W REFLEX MICROSCOPIC   No results found.   No diagnosis found.    MDM  Patient with confirmed nonobstructing clot in the right arm by doppler US.  I've spoken with Dr. Gwenlyn Perking of [facility] has suggested that we  vascular surgery.  He states that she is already therapeutic on her warfarin and feels that  heparinizing her would not resolve her issues.  I've also spoken with Dr. Hart Rochester from vascular surgery who states that this is a non-surgical issue.  He suggested consulting interventional radiology to see if they might do  A thrombolytic  Procedure.   1:42 PM I spoke with Dr. Claudette Laws in interventional radiology who states that the thrombolysis would be contraindicated 3 weeks out from her pericardial window.  He suggests placing a compression sleeve.  Dr. Gwenlyn Perking spoke with Dr. Myna Hidalgo from hematology oncology who also suggested switching the  patient from a warfarin to xarelto. I have discussed the plans and medication change as well as supportive care with the patient who expressed her understanding and agrees with plan.  The patient was seen in a shared visit with Dr. Bernette Mayers.  I spent approximately 1 hour in consult with other physicians due the complex nature of the patient's clotting disorder.  Also the fact that she is 3 weeks status post pericardial window and has developed a clot with a therapeutic INR on warfarin. I also spent approximately 20 minutes answering questions for the family to the best of my ability. The patient appears reasonably screened and/or stabilized for discharge and I doubt any other medical condition or other [facility] requiring further screening, evaluation, or treatment in the ED at this time prior to discharge.           Arthor Captain, PA-C 11/30/12 1953

## 2012-11-30 NOTE — Progress Notes (Signed)
VASCULAR LAB PRELIMINARY  PRELIMINARY  PRELIMINARY  PRELIMINARY  Right upper extremity venous Doppler completed.    Preliminary report:  There is non occlusive DVT noted in the right brachial vein, just before the axilla.  All other vessels appear thrombus free. [name], RVT 11/30/2012, 11:46 AM

## 2012-12-01 NOTE — ED Provider Notes (Signed)
Medical screening examination/treatment/procedure(s) were conducted as a shared visit with non-physician practitioner(s) and myself.  I personally evaluated the patient during the encounter  Pt with complicated history of clotting disorder has developed UE DVT while therapeutic on coumadin, after discussion with Hematology via Hospitalist, vascular surgery and IR, recommend switching to Xarelto but no helpful intervention at this point.   Charles B. Bernette Mayers, MD 12/01/12 Barry Brunner

## 2012-12-03 ENCOUNTER — Telehealth: Payer: Self-pay | Admitting: Oncology

## 2012-12-03 NOTE — Telephone Encounter (Signed)
S/w pt in re to NP appt 05/1 @ 1:30 w/Dr.Granfortuna Dx- UE DVT while on coumadin Referring- ED

## 2012-12-04 ENCOUNTER — Telehealth: Payer: Self-pay | Admitting: Oncology

## 2012-12-04 NOTE — Telephone Encounter (Signed)
C/D 12/04/12 for appt. 12/05/12 °

## 2012-12-05 ENCOUNTER — Other Ambulatory Visit: Payer: Self-pay | Admitting: *Deleted

## 2012-12-05 ENCOUNTER — Other Ambulatory Visit (HOSPITAL_BASED_OUTPATIENT_CLINIC_OR_DEPARTMENT_OTHER): Payer: 59 | Admitting: Lab

## 2012-12-05 ENCOUNTER — Encounter: Payer: Self-pay | Admitting: Oncology

## 2012-12-05 ENCOUNTER — Ambulatory Visit (HOSPITAL_BASED_OUTPATIENT_CLINIC_OR_DEPARTMENT_OTHER): Payer: 59 | Admitting: Oncology

## 2012-12-05 ENCOUNTER — Encounter: Payer: Self-pay | Admitting: Pharmacist

## 2012-12-05 ENCOUNTER — Telehealth: Payer: Self-pay | Admitting: Oncology

## 2012-12-05 ENCOUNTER — Ambulatory Visit: Payer: 59

## 2012-12-05 ENCOUNTER — Telehealth: Payer: Self-pay | Admitting: Pharmacist

## 2012-12-05 VITALS — BP 109/52 | HR 75 | Temp 99.0°F | Resp 18 | Ht 66.0 in | Wt 167.0 lb

## 2012-12-05 DIAGNOSIS — M341 CR(E)ST syndrome: Secondary | ICD-10-CM

## 2012-12-05 DIAGNOSIS — M349 Systemic sclerosis, unspecified: Secondary | ICD-10-CM

## 2012-12-05 DIAGNOSIS — R760 Raised antibody titer: Secondary | ICD-10-CM

## 2012-12-05 DIAGNOSIS — R894 Abnormal immunological findings in specimens from other organs, systems and tissues: Secondary | ICD-10-CM

## 2012-12-05 DIAGNOSIS — Z7901 Long term (current) use of anticoagulants: Secondary | ICD-10-CM

## 2012-12-05 DIAGNOSIS — I319 Disease of pericardium, unspecified: Secondary | ICD-10-CM

## 2012-12-05 DIAGNOSIS — I3139 Other pericardial effusion (noninflammatory): Secondary | ICD-10-CM

## 2012-12-05 DIAGNOSIS — I82409 Acute embolism and thrombosis of unspecified deep veins of unspecified lower extremity: Secondary | ICD-10-CM

## 2012-12-05 DIAGNOSIS — I313 Pericardial effusion (noninflammatory): Secondary | ICD-10-CM

## 2012-12-05 NOTE — Telephone Encounter (Signed)
gv and printed appt sched for pt for June...pt aware coumadin will contact with d/t

## 2012-12-05 NOTE — Progress Notes (Signed)
New consult from Dr. Cyndie Chime for [facility] Coumadin clinic. 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 1st appt in [facility] Coumadin clinic: 12/09/12 at MD request. Ebony Hail, Pharm.D., CPP 12/05/2012@4 :02 PM

## 2012-12-05 NOTE — Progress Notes (Signed)
Checked in new pt with no financial concerns. °

## 2012-12-06 ENCOUNTER — Telehealth: Payer: Self-pay | Admitting: Pharmacist

## 2012-12-06 NOTE — Progress Notes (Signed)
New Patient Hematology-Oncology Evaluation   [name] [id number] 13-Feb-1956 57 y.o. 12/06/2012  CC: Dr. Pearson Grippe; Dr. Alben Deeds; Dr. Jeani Hawking; Dr. 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 By age 51 she was referred for evaluation of a malar rash. 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 In 1992 she had a right lower extremity DVT. 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 Anticoagulation was stopped at that time and within a few weeks she developed a recurrent right lower extremity DVT and was put back on chronic anticoagulation. On 10/10/2012, she tripped over a mop and fell flat on her face. She struck her head, her chest, and fractured her right wrist. A few weeks later on April 3 she presented with dyspnea and was found to have a hemorrhagic pericardial effusion requiring a urgent drainage procedure done on April 4. INR on presentation was 3.8 on Coumadin 7.5 mg daily except 5 mg on Sundays. There were no malignant cells in the pleural fluid. About one week ago on Thursday she started to have discomfort in her right arm. This progressed and was then associated with swelling and increased pain. She presented to the emergency department on Saturday, April 26. She was found to have acute thrombosis in the right brachial vein. INR was 2.4. Coumadin was held when she had to have the pericardial drainage procedure and was reversed with vitamin K and fresh frozen plasma. However she was bridged with Lovenox and when she was stable she was sent home with Lovenox injections and started back on Coumadin at time of discharge. Of note, she did  have a right internal jugular catheter placed at time of surgery.  She was started on the Xarelto 15 mg twice daily for 3 weeks with plan to then decreased to a maintenance dose of 20 mg daily at time of emergency department visit on April 26 and told to call our office for a urgent followup appointment.   PMH: Past Medical History  Diagnosis Date  . DVT (deep venous thrombosis)   . CREST syndrome   . Pericardial effusion   . MVP (mitral valve prolapse)   . Antiphospholipid antibody syndrome   . ITP (idiopathic thrombocytopenic purpura)   . Raynaud's phenomenon   . Hypertension   . Paroxysmal atrial fibrillation   . Paroxysmal atrial fibrillation 09/18/2011  . Antiphospholipid antibody positive 10/10/2012  . Hemorrhagic pericardial effusion 11/07/2012  . CREST syndrome 11/08/2012  . 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 Thyroid goiter     Past Surgical History  Procedure Laterality Date  . Splenectomy      ITP  . Uterine ablation    . Tubal ligation    . Subxyphoid pericardial window N/A 11/08/2012    Procedure: SUBXYPHOID PERICARDIAL WINDOW;  Surgeon: Purcell Nails, MD;  Location: [facility] OR;  Service: Thoracic;  Laterality: N/A;    Allergies: Allergies  Allergen Reactions  . Codeine Nausea And Vomiting  . Demerol   . Ultram (Tramadol Hcl) Other (See Comments)    Makes her hands shake.    Medications: Xarelto 15 mg twice daily. Lasix 40 mg daily. Neurontin 400 mg twice a day for trigeminal neuralgia.  Norco 5/325 one-2 every 6 hours when necessary pain. Atrovent nasal spray 2 sprays 4 times a day when necessary. Alrex 0.2% suspension both eyes 4 times daily. Lopressor 12.5 mg twice a day. Nasonex 50 mcg spray 2 sprays every morning. KCl 20 mEq daily, Prometrium 100 mg daily, rifaximin 550 mg daily, Systane ultra op one drop 4 times a day both eyes when necessary dry eyes.   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 They have 3 children son  53, a daughter 40, both healthy.  reports that she has never smoked. She has never used smokeless tobacco. She reports that she does not drink alcohol or use illicit drugs.  Family History: Her only sibling a sister age 10 has rheumatoid arthritis. Family History  Problem Relation Age of Onset  . Cancer Mother     Lung  . Diabetes Father   . COPD Father   . Hyperlipidemia Father   . Hypertension Father     Review of Systems: Constitutional symptoms: Significant fatigue over recent events. HEENT: No sore throat. Positive dry mouth. Dry eyes. Respiratory: No cough or dyspnea at present Cardiovascular: No chest pain or palpitations  Gastrointestinal ROS: Scleroderma or GI symptoms including intermittent diarrhea. She required esophageal dilatation in the past but not for many years. Minimal dysphagia which she compensates for by drinking a lot of fluid when she eats Genito-Urinary ROS: No urinary tract symptoms Hematological and Lymphatic: Musculoskeletal: She started to have a flare up of polyarthralgias beginning last fall. She tells me she had a prolonged remission of musculoskeletal complaints following splenectomy and things only started to flareup over the last year. Neurologic: Trigeminal neuralgia. Dermatologic: No rash. Scattered ecchymoses. Remaining ROS negative.  Physical Exam: Blood pressure 109/52, pulse 75, temperature 99 F (37.2 C), temperature source Oral, resp. rate 18, height 5\' 6"  (1.676 m), weight 167 lb (75.751 kg). Wt Readings from Last 3 Encounters:  12/05/12 167 lb (75.751 kg)  11/28/12 164 lb 12.8 oz (74.753 kg)  11/22/12 165 lb 6 oz (75.014 kg)    General appearance: Caucasian woman who appears chronically ill and older than stated age HENNT: Pharynx no erythema or exudate Lymph nodes: No adenopathy Breasts: Lungs: Clear to auscultation resonant to percussion Heart: Regular rhythm no murmur or gallop Vascular: Currently no cyanosis. Carotids 2+. No  bruits. Radial pulses 2+ symmetric. Ulnar pulses not palpable but symmetric. Dorsalis pedis pulses not palpable but the warm and well perfused. She is wearing red nail polish. Abdominal: Soft, nontender,. Status post splenectomy. No hepatomegaly. GU: Extremities: There is swelling of the proximal right upper extremity. Sclerodactyly. Neurologic: Mental status intact, PERRLA, optic disc sharp and vessels normal, no hemorrhage or exudate. Motor strength 5 over 5. Reflexes 2+ symmetric. Sensation intact to vibration over the fingertips. Skin: Scattered ecchymoses on her hands, thin skin    Lab Results: Lab Results  Component Value Date   WBC 15.4* 11/30/2012   HGB 12.1 11/30/2012   HCT 37.2 11/30/2012   MCV 86.1 11/30/2012   PLT 475* 11/30/2012     Chemistry      Component Value Date/Time   NA 137 11/30/2012 1041   K 3.7 11/30/2012 1041   CL 100 11/30/2012 1041   CO2 25 11/30/2012 1041   BUN 15 11/30/2012 1041   CREATININE 0.87 11/30/2012 1041      Component Value Date/Time   CALCIUM 9.9 11/30/2012 1041   ALKPHOS 130* 11/10/2012 0459   AST 14 11/10/2012 0459  ALT 18 11/10/2012 0459   BILITOT 0.6 11/10/2012 0459        Impression and Plan: Rather complex situation. Traumatic versus inflammatory hemorrhagic pericardial effusion. She was experiencing a flare in her collagen vascular disorder when she developed the pericardial effusion. She was on chronic Coumadin anticoagulation due to previous DVTs. She had blunt trauma to her chest while on Coumadin. She had very transient interruption of her anticoagulation due to an need for emergency pericardial drainage procedure. She now presents with a right upper extremity DVT while on therapeutic Coumadin. However, I do not believe she is a true Coumadin failure. She had a number of predisposing events for this DVT including placement of a central right internal jugular catheter and transient interruption of anticoagulation around a surgical procedure.    Recommendation: Due to the location of the hemorrhage, the short interval between a pericardial drainage procedure and the development of a clot, and the fact that we still do not have a reversal agent for the Xarelto, I think it is safer to stop the Xarelto and put her back on the Coumadin with close monitoring. She took one 15 mg dose of Xarelto this morning. The drug has a half-life of about 48 hours in somebody with normal renal function. I told her to stop the Xarelto at this time and resume Coumadin at a dose of 7.5 mg daily except 5 mg on Mondays and Thursdays. She would like to have her Coumadin monitored in our office Coumadin clinic and we would be happy to accommodate her in this regard.      Levert Feinstein, MD 12/06/2012, 7:46 AM

## 2012-12-06 NOTE — Telephone Encounter (Signed)
Returned patients call from CC phone.  Informed her of her first cc appmt on 12/09/12 as per previous note and MD last office visit note.

## 2012-12-09 ENCOUNTER — Ambulatory Visit (INDEPENDENT_AMBULATORY_CARE_PROVIDER_SITE_OTHER): Payer: Self-pay | Admitting: Surgical

## 2012-12-09 ENCOUNTER — Ambulatory Visit (HOSPITAL_BASED_OUTPATIENT_CLINIC_OR_DEPARTMENT_OTHER): Payer: 59 | Admitting: Pharmacist

## 2012-12-09 ENCOUNTER — Encounter: Payer: Self-pay | Admitting: Thoracic Surgery (Cardiothoracic Vascular Surgery)

## 2012-12-09 ENCOUNTER — Other Ambulatory Visit (HOSPITAL_BASED_OUTPATIENT_CLINIC_OR_DEPARTMENT_OTHER): Payer: 59 | Admitting: Lab

## 2012-12-09 VITALS — BP 119/70 | HR 71 | Resp 16 | Ht 66.0 in | Wt 167.0 lb

## 2012-12-09 DIAGNOSIS — R894 Abnormal immunological findings in specimens from other organs, systems and tissues: Secondary | ICD-10-CM

## 2012-12-09 DIAGNOSIS — Z7901 Long term (current) use of anticoagulants: Secondary | ICD-10-CM

## 2012-12-09 DIAGNOSIS — I3139 Other pericardial effusion (noninflammatory): Secondary | ICD-10-CM

## 2012-12-09 DIAGNOSIS — I319 Disease of pericardium, unspecified: Secondary | ICD-10-CM

## 2012-12-09 DIAGNOSIS — R760 Raised antibody titer: Secondary | ICD-10-CM

## 2012-12-09 DIAGNOSIS — Z09 Encounter for follow-up examination after completed treatment for conditions other than malignant neoplasm: Secondary | ICD-10-CM

## 2012-12-09 DIAGNOSIS — I82409 Acute embolism and thrombosis of unspecified deep veins of unspecified lower extremity: Secondary | ICD-10-CM

## 2012-12-09 DIAGNOSIS — M341 CR(E)ST syndrome: Secondary | ICD-10-CM

## 2012-12-09 DIAGNOSIS — Z5189 Encounter for other specified aftercare: Secondary | ICD-10-CM

## 2012-12-09 DIAGNOSIS — I313 Pericardial effusion (noninflammatory): Secondary | ICD-10-CM

## 2012-12-09 LAB — PROTIME-INR
INR: 1.6 — ABNORMAL LOW (ref 2.00–3.50)
Protime: 19.2 Seconds — ABNORMAL HIGH (ref 10.6–13.4)

## 2012-12-09 LAB — POCT INR: INR: 1.6

## 2012-12-09 NOTE — Patient Instructions (Signed)
Patient is instructed to return to work 3 days a week, 8 hour shifts with gradual increase to full time.

## 2012-12-09 NOTE — Progress Notes (Signed)
[address]            [place] [address]          [phone]       [facility]in[facility] Medical Record #[id number] Date of Birth: 09/12/1955  Leeann Must, MD Pearson Grippe, MD  Chief Complaint:   PostOp Follow Up Visit   History of Present Illness:    The patient is seen in office for  followup after pericardial window on 11/07/2012. This was felt to be related to trauma from a fall. In addition she was on Coumadin therapy at the time . Currently she feels well. She does have some shortness of breath when going up steps. She is tolerating her usual activities of daily living at home without significant difficulty although she is easily fatigued and goes to bed early. She has sense been restarted on Coumadin as Ellin Goodie is felt to be contraindicated should she have further trauma do to its inability to be reversed. This is being managed by Dr. Cyndie Chime           History  Smoking status  . Never Smoker   Smokeless tobacco  . Never Used       Allergies  Allergen Reactions  . Codeine Nausea And Vomiting  . Demerol   . Ultram (Tramadol Hcl) Other (See Comments)    Makes her hands shake.    Current Outpatient Prescriptions  Medication Sig Dispense Refill  . Calcium Carbonate-Vitamin D (CALCIUM + D PO) Take 1 tablet by mouth daily.      Marland Kitchen esomeprazole (NEXIUM) 40 MG capsule Take 40 mg by mouth 2 (two) times daily.      . furosemide (LASIX) 40 MG tablet Take 1 tablet (40 mg total) by mouth daily.  30 tablet  3  . gabapentin (NEURONTIN) 100 MG capsule Take 400 mg by mouth 2 (two) times daily.       Marland Kitchen ipratropium (ATROVENT) 0.06 % nasal spray Place 2 sprays into the nose 4 (four) times daily as needed for rhinitis.      Marland Kitchen loteprednol (ALREX) 0.2 % SUSP Place 1 drop into both eyes 4 (four) times daily.      . metoprolol tartrate (LOPRESSOR) 12.5 mg TABS Take 0.5 tablets (12.5 mg total) by mouth 2 (two) times daily.  30 tablet  3  .  mometasone (NASONEX) 50 MCG/ACT nasal spray Place 2 sprays into the nose every morning.      Bertram Gala Glycol-Propyl Glycol (SYSTANE ULTRA OP) Apply 1 drop to eye 4 (four) times daily as needed (dry eyes).      . potassium chloride 20 MEQ TBCR Take 20 mEq by mouth daily.  30 tablet  3  . progesterone (PROMETRIUM) 100 MG capsule Take 100 mg by mouth at bedtime.       . rifaximin (XIFAXAN) 550 MG TABS Take 550 mg by mouth daily.      . Risedronate Sodium (ACTONEL PO) Take 1 tablet by mouth every 30 (thirty) days.      Marland Kitchen warfarin (COUMADIN) 7.5 MG tablet Take 7.5 mg by mouth daily. everyday except for 5 mgm on T/TH      . Rivaroxaban (XARELTO) 20 MG TABS Take 1 tablet (20 mg total) by mouth daily.  30 tablet  1   No current facility-administered medications for this visit.  Physical Exam: BP 119/70  Pulse 71  Resp 16  Ht 5\' 6"  (1.676 m)  Wt 167 lb (75.751 kg)  BMI 26.97 kg/m2  SpO2 96%  General appearance: alert, cooperative and no distress Heart: regular rate and rhythm and Soft systolic murmur Lungs: clear to auscultation bilaterally Wound: Incision without evidence of infection   Diagnostic Studies & Laboratory data:         Recent Radiology Findings: No results found.    Recent Labs: Lab Results  Component Value Date   WBC 15.4* 11/30/2012   HGB 12.1 11/30/2012   HCT 37.2 11/30/2012   PLT 475* 11/30/2012   GLUCOSE 98 11/30/2012   CHOL  Value: 130        ATP III CLASSIFICATION:  <200     mg/dL   Desirable  295-621  mg/dL   Borderline High  >=308    mg/dL   High        6/57/8469   TRIG 36 11/20/2008   HDL 48 11/20/2008   LDLCALC  Value: 75        Total Cholesterol/HDL:CHD Risk Coronary Heart Disease Risk Table                     Men   Women  1/2 Average Risk   3.4   3.3  Average Risk       5.0   4.4  2 X Average Risk   9.6   7.1  3 X Average Risk  23.4   11.0        Use the calculated Patient Ratio above and the CHD Risk Table to determine the patient's CHD Risk.         ATP III CLASSIFICATION (LDL):  <100     mg/dL   Optimal  629-528  mg/dL   Near or Above                    Optimal  130-159  mg/dL   Borderline  413-244  mg/dL   High  >010     mg/dL   Very High 2/72/5366   ALT 18 11/10/2012   AST 14 11/10/2012   NA 137 11/30/2012   K 3.7 11/30/2012   CL 100 11/30/2012   CREATININE 0.87 11/30/2012   BUN 15 11/30/2012   CO2 25 11/30/2012   TSH 0.907 11/08/2012   INR 2.48* 11/30/2012      Assessment / Plan:  Patient has recovered from pericardial effusion. She is multiple medical comorbidities being managed by her other physicians. She is anxious to go back to work and from a surgical perspective we feel that she can do three 8 hour shifts and gradually work back to full time. We will see her again on a when necessary basis.          Jessenia Filippone E 12/09/2012 1:26 PM

## 2012-12-09 NOTE — Progress Notes (Signed)
INR = 1.6 after restarting Coumadin on 12/06/12 for newly dx R subclavian DVT She has taken 4 doses of Coumadin 7.5 mg.  Her current dose of Coumadin is 7.5 mg/day; 5 mg Tu/Th (pt chose Tu/Th so she can remember better). The pt states she was taking Coumadin 7.5 mg/day except 5 mg on Sundays prior to admission to the hospital w/ the new DVT. Pt has taken Coumadin in the past & took Xarelto briefly.  This was switched to Coumadin in case of trauma; no reversal agent. I s/w Dr. Cyndie Chime.  He would like to now start pt on Lovenox since INR is subtherapeutic. I gave her 4 syringes of Lovenox 120 mg of which she will inject 0.7 mL (= 110 mg) for her daily dose starting today. Continue plan of Coumadin 7.5 mg daily; 5 mg Tu/Th. Recheck INR this Thursday.  If INR is therapeutic, we can stop Lovenox. Ebony Hail, Pharm.D., CPP 12/09/2012@3 :08 PM

## 2012-12-12 ENCOUNTER — Other Ambulatory Visit: Payer: Self-pay | Admitting: *Deleted

## 2012-12-12 ENCOUNTER — Ambulatory Visit (HOSPITAL_BASED_OUTPATIENT_CLINIC_OR_DEPARTMENT_OTHER): Payer: 59 | Admitting: Pharmacist

## 2012-12-12 ENCOUNTER — Other Ambulatory Visit: Payer: 59 | Admitting: Lab

## 2012-12-12 DIAGNOSIS — I3139 Other pericardial effusion (noninflammatory): Secondary | ICD-10-CM

## 2012-12-12 DIAGNOSIS — R760 Raised antibody titer: Secondary | ICD-10-CM

## 2012-12-12 DIAGNOSIS — I82409 Acute embolism and thrombosis of unspecified deep veins of unspecified lower extremity: Secondary | ICD-10-CM

## 2012-12-12 DIAGNOSIS — M341 CR(E)ST syndrome: Secondary | ICD-10-CM

## 2012-12-12 DIAGNOSIS — I313 Pericardial effusion (noninflammatory): Secondary | ICD-10-CM

## 2012-12-12 DIAGNOSIS — R894 Abnormal immunological findings in specimens from other organs, systems and tissues: Secondary | ICD-10-CM

## 2012-12-12 DIAGNOSIS — Z7901 Long term (current) use of anticoagulants: Secondary | ICD-10-CM

## 2012-12-12 LAB — PROTIME-INR
INR: 2 (ref 2.00–3.50)
Protime: 24 Seconds — ABNORMAL HIGH (ref 10.6–13.4)

## 2012-12-12 MED ORDER — WARFARIN SODIUM 5 MG PO TABS: ORAL_TABLET | ORAL | Status: DC

## 2012-12-12 NOTE — Progress Notes (Signed)
INR within goal today. No problems to report regarding anticoagulation. Will discontinue Lovenox after today's dose. Continue Coumadin 7.5 mg daily except 5 mg Tues&Thurs. Repeat INR on 12/16/12 at 8:45 am for lab and 9am for coumadin clinic.

## 2012-12-12 NOTE — Patient Instructions (Addendum)
Discontinue Lovenox after today's dose. Continue Coumadin 7.5 mg daily except 5 mg Tues&Thurs. Repeat INR on 12/16/12 at 8:45 am for lab and 9am for coumadin clinic.

## 2012-12-13 ENCOUNTER — Ambulatory Visit: Payer: PRIVATE HEALTH INSURANCE | Attending: Orthopedic Surgery | Admitting: *Deleted

## 2012-12-13 DIAGNOSIS — IMO0001 Reserved for inherently not codable concepts without codable children: Secondary | ICD-10-CM | POA: Insufficient documentation

## 2012-12-13 DIAGNOSIS — S5290XD Unspecified fracture of unspecified forearm, subsequent encounter for closed fracture with routine healing: Secondary | ICD-10-CM | POA: Insufficient documentation

## 2012-12-13 DIAGNOSIS — M2560 Stiffness of unspecified joint, not elsewhere classified: Secondary | ICD-10-CM | POA: Insufficient documentation

## 2012-12-13 DIAGNOSIS — M6281 Muscle weakness (generalized): Secondary | ICD-10-CM | POA: Insufficient documentation

## 2012-12-13 DIAGNOSIS — M25649 Stiffness of unspecified hand, not elsewhere classified: Secondary | ICD-10-CM | POA: Insufficient documentation

## 2012-12-16 ENCOUNTER — Ambulatory Visit: Payer: 59 | Admitting: Thoracic Surgery (Cardiothoracic Vascular Surgery)

## 2012-12-16 ENCOUNTER — Other Ambulatory Visit (HOSPITAL_BASED_OUTPATIENT_CLINIC_OR_DEPARTMENT_OTHER): Payer: 59 | Admitting: Lab

## 2012-12-16 ENCOUNTER — Ambulatory Visit (HOSPITAL_BASED_OUTPATIENT_CLINIC_OR_DEPARTMENT_OTHER): Payer: 59 | Admitting: Pharmacist

## 2012-12-16 DIAGNOSIS — Z7901 Long term (current) use of anticoagulants: Secondary | ICD-10-CM

## 2012-12-16 DIAGNOSIS — R760 Raised antibody titer: Secondary | ICD-10-CM

## 2012-12-16 DIAGNOSIS — M341 CR(E)ST syndrome: Secondary | ICD-10-CM

## 2012-12-16 DIAGNOSIS — I313 Pericardial effusion (noninflammatory): Secondary | ICD-10-CM

## 2012-12-16 DIAGNOSIS — I82409 Acute embolism and thrombosis of unspecified deep veins of unspecified lower extremity: Secondary | ICD-10-CM

## 2012-12-16 LAB — POCT INR: INR: 2.5

## 2012-12-16 LAB — PROTIME-INR: Protime: 30 Seconds — ABNORMAL HIGH (ref 10.6–13.4)

## 2012-12-16 NOTE — Progress Notes (Signed)
INR at goal of 2-3.5.  No medication changes or bleeding/bruising.  [name] has been on current coumadin dose of 7.5mg  daily and 5mg  T/Th for over 1 week.  Will check PT/INR in 2 weeks.

## 2012-12-20 ENCOUNTER — Telehealth: Payer: Self-pay | Admitting: Cardiology

## 2012-12-20 NOTE — Telephone Encounter (Signed)
Debbie- front check in called stating there was a patient dropping off disability paperwork.  Once at front check in, the patient stated that [facility] had told her to complete as much as possible and he would finish what she couldn't and sign. The patient states that she could not complete sections # 17, the yes part of #20, and #21 Because there are only three questions, I have attached a walk in patient form and will give it to Avie Arenas for [facility].

## 2012-12-25 ENCOUNTER — Ambulatory Visit: Payer: 59 | Attending: Occupational Therapy | Admitting: Occupational Therapy

## 2012-12-31 ENCOUNTER — Ambulatory Visit: Payer: 59 | Admitting: Occupational Therapy

## 2013-01-02 ENCOUNTER — Encounter: Payer: Self-pay | Admitting: *Deleted

## 2013-01-02 ENCOUNTER — Ambulatory Visit: Payer: Self-pay

## 2013-01-02 ENCOUNTER — Other Ambulatory Visit: Payer: Self-pay | Admitting: Lab

## 2013-01-02 ENCOUNTER — Telehealth: Payer: Self-pay | Admitting: Pharmacist

## 2013-01-03 NOTE — Telephone Encounter (Signed)
Pt called and stated that she needed to cancel her lab/coumadin clinic appointment on 01/02/13. She is going to have her PCP check her INR's because it is more convenient for her. His name is Dr. Pearson Grippe and he is with [facility].

## 2013-01-07 ENCOUNTER — Ambulatory Visit: Payer: 59 | Admitting: *Deleted

## 2013-01-23 ENCOUNTER — Telehealth: Payer: Self-pay | Admitting: Oncology

## 2013-01-23 NOTE — Telephone Encounter (Signed)
Pt called and cancelled appt for  6/24 and will call to r/s MD visit due to work

## 2013-01-28 ENCOUNTER — Ambulatory Visit: Payer: 59 | Admitting: Oncology

## 2013-03-06 ENCOUNTER — Other Ambulatory Visit: Payer: Self-pay | Admitting: Physician Assistant

## 2013-03-06 ENCOUNTER — Other Ambulatory Visit: Payer: Self-pay

## 2013-03-06 MED ORDER — FUROSEMIDE 40 MG PO TABS: ORAL_TABLET | ORAL | Status: DC

## 2013-03-06 MED ORDER — POTASSIUM CHLORIDE CRYS ER 20 MEQ PO TBCR: EXTENDED_RELEASE_TABLET | ORAL | Status: DC

## 2013-03-28 ENCOUNTER — Ambulatory Visit: Payer: 59 | Admitting: Cardiology

## 2013-04-16 LAB — PROTIME-INR: INR: 3.3 — AB (ref 0.9–1.1)

## 2013-05-19 ENCOUNTER — Institutional Professional Consult (permissible substitution): Payer: Self-pay | Admitting: Pulmonary Disease

## 2013-05-21 ENCOUNTER — Other Ambulatory Visit: Payer: Self-pay | Admitting: Obstetrics and Gynecology

## 2013-05-23 ENCOUNTER — Ambulatory Visit (INDEPENDENT_AMBULATORY_CARE_PROVIDER_SITE_OTHER): Payer: Self-pay | Admitting: Cardiology

## 2013-05-23 ENCOUNTER — Encounter: Payer: Self-pay | Admitting: Cardiology

## 2013-05-23 VITALS — BP 118/72 | HR 62 | Ht 66.0 in | Wt 178.0 lb

## 2013-05-23 DIAGNOSIS — I480 Paroxysmal atrial fibrillation: Secondary | ICD-10-CM

## 2013-05-23 DIAGNOSIS — I4891 Unspecified atrial fibrillation: Secondary | ICD-10-CM

## 2013-05-23 NOTE — Patient Instructions (Signed)
The current medical regimen is effective;  continue present plan and medications.  Follow up as needed 

## 2013-05-23 NOTE — Progress Notes (Signed)
HPI The patient presents for an add-on evaluation of hemorrhagic pericardial effusion. Earlier this year she suffered a fall and on anticoagulation had bleeding into her pericardium requiring window. She did have a brief run of paroxysmal atrial fibrillation during this hospitalization.   She seems to have recovered from her trauma. She's  Not describing any cardiovascular symptoms. She denies chest pressure, neck or arm discomfort. She's had no palpitations, presyncope or syncope. She has had no PND or orthopnea.  Allergies  Allergen Reactions  . Demerol   . Codeine Nausea And Vomiting  . Ultram [Tramadol Hcl] Other (See Comments)    Makes her hands shake.    Current Outpatient Prescriptions  Medication Sig Dispense Refill  . Calcium Carbonate-Vitamin D (CALCIUM + D PO) Take 1 tablet by mouth daily.      Marland Kitchen esomeprazole (NEXIUM) 40 MG capsule Take 40 mg by mouth 2 (two) times daily.      . furosemide (LASIX) 40 MG tablet TAKE 1 TABLET BY MOUTH DAILY  90 tablet  3  . gabapentin (NEURONTIN) 100 MG capsule Take 400 mg by mouth 2 (two) times daily.       Marland Kitchen ipratropium (ATROVENT) 0.06 % nasal spray Place 2 sprays into the nose 4 (four) times daily as needed for rhinitis.      Marland Kitchen loteprednol (ALREX) 0.2 % SUSP Place 1 drop into both eyes as needed.       . metoprolol tartrate (LOPRESSOR) 12.5 mg TABS Take 0.5 tablets (12.5 mg total) by mouth 2 (two) times daily.  30 tablet  3  . mometasone (NASONEX) 50 MCG/ACT nasal spray Place 2 sprays into the nose every morning.      Bertram Gala Glycol-Propyl Glycol (SYSTANE ULTRA OP) Apply 1 drop to eye 4 (four) times daily as needed (dry eyes).      . potassium chloride SA (K-DUR,KLOR-CON) 20 MEQ tablet TAKE 1 TABLET BY MOUTH DAILY  90 tablet  3  . progesterone (PROMETRIUM) 100 MG capsule Take 100 mg by mouth at bedtime.       . rifaximin (XIFAXAN) 550 MG TABS Take 550 mg by mouth daily.      . Risedronate Sodium (ACTONEL PO) Take 1 tablet by mouth every  30 (thirty) days.      Marland Kitchen warfarin (COUMADIN) 5 MG tablet Take one tab po mon & thurs, 1 1/2 tab po = 7.5 mg tues/Wed/Fri/Sat/Sun  50 tablet  prn  . warfarin (COUMADIN) 7.5 MG tablet Take 7.5 mg by mouth daily. everyday except for 5 mgm on T/TH       No current facility-administered medications for this visit.    Past Medical History  Diagnosis Date  . DVT (deep venous thrombosis)   . CREST syndrome   . Pericardial effusion   . MVP (mitral valve prolapse)   . Antiphospholipid antibody syndrome   . ITP (idiopathic thrombocytopenic purpura)   . Raynaud's phenomenon   . Hypertension   . Paroxysmal atrial fibrillation   . Paroxysmal atrial fibrillation 09/18/2011  . Antiphospholipid antibody positive 10/10/2012  . Hemorrhagic pericardial effusion 11/07/2012  . CREST syndrome 11/08/2012  . 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 Thyroid goiter     Past Surgical History  Procedure Laterality Date  . Splenectomy      ITP  . Uterine ablation    . Tubal ligation    . Subxyphoid pericardial window N/A 11/08/2012    Procedure: SUBXYPHOID PERICARDIAL WINDOW;  Surgeon: Purcell Nails, MD;  Location: [facility] OR;  Service: Thoracic;  Laterality: N/A;   ROS:  As stated in the HPI and negative for all other systems.  PHYSICAL EXAM BP 118/72  Pulse 62  Ht 5\' 6"  (1.676 m)  Wt 178 lb (80.74 kg)  BMI 28.74 kg/m2 GENERAL:  Well appearing NECK:  No jugular venous distention, waveform within normal limits, carotid upstroke brisk and symmetric, no bruits, no thyromegaly LUNGS:  Clear to auscultation bilaterally BACK:  No CVA tenderness CHEST:  Unremarkable HEART:  PMI not displaced or sustained,S1 and S2 within normal limits, no S3, no S4, no clicks, no rubs, no murmurs ABD:  Flat, positive bowel sounds normal in frequency in pitch, no bruits, no rebound, no guarding, no midline pulsatile mass, no hepatomegaly, no splenomegaly EXT:  2 plus pulses throughout, no edema, no cyanosis no  clubbing   ASSESSMENT AND PLAN  PERICARDIAL EFFUSION:  The well since her pericardial window.  There would be no indication of recurrent effusion.  No further work up is planned.   REYNAUDS:  The patient will resume her Procardia XL for this.  ATRIAL FIBRILLATION:  She has had no recurrent symptomatic tachycardia arrhythmias. She will remain on anticoagulation.  She will remain off aspirin.  She can come off of her beta blocker.

## 2013-05-29 ENCOUNTER — Encounter: Payer: Self-pay | Admitting: Nurse Practitioner

## 2013-05-29 ENCOUNTER — Ambulatory Visit (INDEPENDENT_AMBULATORY_CARE_PROVIDER_SITE_OTHER): Payer: 59 | Admitting: Nurse Practitioner

## 2013-05-29 VITALS — BP 138/79 | HR 71 | Ht 64.0 in | Wt 178.0 lb

## 2013-05-29 DIAGNOSIS — G509 Disorder of trigeminal nerve, unspecified: Secondary | ICD-10-CM

## 2013-05-29 DIAGNOSIS — R209 Unspecified disturbances of skin sensation: Secondary | ICD-10-CM

## 2013-05-29 MED ORDER — GABAPENTIN 400 MG PO CAPS: 400.0000 mg | ORAL_CAPSULE | Freq: Three times a day (TID) | ORAL | Status: DC

## 2013-05-29 NOTE — Progress Notes (Signed)
I reviewed note and agree with plan.   Suanne Marker, MD 05/29/2013, 10:51 AM Certified in Neurology, Neurophysiology and Neuroimaging  [facility] [address] [place], [place] [address] [phone]

## 2013-05-29 NOTE — Patient Instructions (Signed)
Increase Gabapentin to 400mg   Three times daily RX to patient PCP may take over prescribing if agreeable F/U prn

## 2013-05-29 NOTE — Progress Notes (Signed)
[facility]  PATIENT: [name] DOB: 1955/12/28   REASON FOR VISIT: parathesias of the face   HISTORY OF PRESENT ILLNESS: [name], 57 year old female returns for followup. She was last seen in our office 05/29/2012 and is a previous patient of Dr. Sandria Manly who has left the practice.  The patient received an MRI study of the brain 08/22/2012 showng  evidence of enlargement of the ventricular system.  She previously had a MRI study of the brain 10/02/2008 also showing enlargement of ventricular system.  Both Dr. Pearlean Brownie and Dr. Sandria Manly felt this was unchanged. He has stopped her Cymbalta since last seen she continues on gabapentin 400 mg twice daily. She continues to have some paresthesias of the face in the V2 and V3 distribution, around 5:00 every afternoon. She needs refills on her medications    HISTORY: 57 year old right-handed white married female returns today for follow-up.  She was last seen 05/30/11. She has a known hystory of scleroderma.  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 She's also had a history of gastroesophageal reflux.  5 years ago she had dental work on the right side of her mouth and  3-4 weeks afterwards  noted numbness involving the right V1, V2, and V3 distribution and also to a lesser extent the left V3 and possibly V2 distribution. She has altered sensation in the tongue on the right and minimally on the left.  There's been no weakness, loss of vision, double vision, but developed swallowing problems and slurred speech. She has difficulty drinking fluids and uses a straw for a period of time.  Gabapentin increased at last visit in June to 300 mg t.i.d. ,she's had improvement in the drawing and pulling sensation in her face.  There Is still numbness. She's had no other new symptoms. She was hospitalized in May 2010 for chest pain radiating into her back and was found to have esophageal spasm. It  is felt that she most likely had food impaction and was seen by Dr. Elnoria Howard. She was evaluated by  ENT who could not find a source for her symptoms.  Cymbalta added at last visit which she says is beneficial.  No new medical issues. Pt did not go for referral at [facility] with Dr. Conrad [place].    REVIEW OF SYSTEMS: Full 14 system review of systems performed and notable only for:  Constitutional: N/A  Cardiovascular: N/A  Ear/Nose/Throat: N/A  Skin: N/A  Eyes: N/A  Respiratory: N/A  Gastroitestinal: N/A  Hematology/Lymphatic: N/A  Endocrine: N/A Musculoskeletal:N/A  Allergy/Immunology: N/A  Neurological: Paresthesias of the face right side  Psychiatric: N/A   ALLERGIES: Allergies  Allergen Reactions  . Demerol   . Codeine Nausea And Vomiting  . Ultram [Tramadol Hcl] Other (See Comments)    Makes her hands shake.    HOME MEDICATIONS: Outpatient Prescriptions Prior to Visit  Medication Sig Dispense Refill  . Calcium Carbonate-Vitamin D (CALCIUM + D PO) Take 1 tablet by mouth daily.      Marland Kitchen esomeprazole (NEXIUM) 40 MG capsule Take 40 mg by mouth 2 (two) times daily.      . furosemide (LASIX) 40 MG tablet TAKE 1 TABLET BY MOUTH DAILY  90 tablet  3  . gabapentin (NEURONTIN) 100 MG capsule Take 400 mg by mouth 2 (two) times daily.       Marland Kitchen ipratropium (ATROVENT) 0.06 % nasal spray Place 2 sprays into the nose 4 (four) times daily as  needed for rhinitis.      Marland Kitchen loteprednol (ALREX) 0.2 % SUSP Place 1 drop into both eyes as needed.       . mometasone (NASONEX) 50 MCG/ACT nasal spray Place 2 sprays into the nose every morning.      Bertram Gala Glycol-Propyl Glycol (SYSTANE ULTRA OP) Apply 1 drop to eye 4 (four) times daily as needed (dry eyes).      . potassium chloride SA (K-DUR,KLOR-CON) 20 MEQ tablet TAKE 1 TABLET BY MOUTH DAILY  90 tablet  3  . progesterone (PROMETRIUM) 100 MG capsule Take 100 mg by mouth at bedtime.       . rifaximin (XIFAXAN) 550 MG TABS Take 550 mg by mouth daily.       . Risedronate Sodium (ACTONEL PO) Take 1 tablet by mouth every 30 (thirty) days.      Marland Kitchen warfarin (COUMADIN) 5 MG tablet Take one tab po mon & thurs, 1 1/2 tab po = 7.5 mg tues/Wed/Fri/Sat/Sun  50 tablet  prn  . warfarin (COUMADIN) 7.5 MG tablet Take 7.5 mg by mouth daily. everyday except for 5 mgm on T/TH      . metoprolol tartrate (LOPRESSOR) 12.5 mg TABS Take 0.5 tablets (12.5 mg total) by mouth 2 (two) times daily.  30 tablet  3   No facility-administered medications prior to visit.    PAST MEDICAL HISTORY: Past Medical History  Diagnosis Date  . DVT (deep venous thrombosis)   . CREST syndrome   . Pericardial effusion   . MVP (mitral valve prolapse)   . Antiphospholipid antibody syndrome   . ITP (idiopathic thrombocytopenic purpura)   . Raynaud's phenomenon   . Hypertension   . Paroxysmal atrial fibrillation   . Paroxysmal atrial fibrillation 09/18/2011  . Antiphospholipid antibody positive 10/10/2012  . Hemorrhagic pericardial effusion 11/07/2012  . CREST syndrome 11/08/2012  . 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 Thyroid goiter     PAST SURGICAL HISTORY: Past Surgical History  Procedure Laterality Date  . Splenectomy      ITP  . Uterine ablation    . Tubal ligation    . Subxyphoid pericardial window N/A 11/08/2012    Procedure: SUBXYPHOID PERICARDIAL WINDOW;  Surgeon: Purcell Nails, MD;  Location: [facility] OR;  Service: Thoracic;  Laterality: N/A;    FAMILY HISTORY: Family History  Problem Relation Age of Onset  . Cancer Mother     Lung  . Diabetes Father   . COPD Father   . Hyperlipidemia Father   . Hypertension Father     SOCIAL HISTORY: History   Social History  . Marital Status: Married    Spouse Name: John    Number of Children: 2  . Years of Education: college   Occupational History  .      RN -   Social History Main Topics  . Smoking status: Never Smoker   . Smokeless tobacco: Never Used  . Alcohol Use: 0.6 oz/week    1 Glasses of wine per  week     Comment: weekends  . Drug Use: No  . Sexual Activity: Not Currently   Other Topics Concern  . Not on file   Social History Narrative   Two children and 3 step children and five grandchildren.  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   Right handed.   Caffeine-three or four cups of coffee daily. Tea also   [employer]  PHYSICAL EXAM  Filed Vitals:   05/29/13 0820  BP: 138/79  Pulse: 71  Height: 5\' 4"  (1.626 m)  Weight: 178 lb (80.74 kg)   Body mass index is 30.54 kg/(m^2).  Generalized: Well developed, in no acute distress   Neurological examination   Mentation: Alert oriented to time, place, history taking. Follows all commands speech and language fluent  Cranial nerve II-XII: .Pupils were equal round reactive to light extraocular movements were full, visual field were full on confrontational test. Facial sensation and strength were normal. hearing was intact to finger rubbing bilaterally. Uvula tongue midline. head turning and shoulder shrug and were normal and symmetric.Tongue protrusion into cheek strength was normal. Motor: normal bulk and tone, full strength in the BUE, BLE, fine finger movements normal, no pronator drift. No focal weakness Sensory: normal and symmetric to light touch, pinprick, and  vibration  Coordination: finger-nose-finger, heel-to-shin bilaterally, no dysmetria Reflexes: 1+ upper lower and symmetric  Gait and Station: Rising up from seated position without assistance, normal stance,  good arm swing, smooth turning, able to perform tiptoe, and heel walking without difficulty. Tandem gait steady   DIAGNOSTIC DATA (LABS, IMAGING, TESTING) - I reviewed patient records, labs, notes, testing and imaging myself where available.  Lab Results  Component Value Date   WBC 15.4* 11/30/2012   HGB 12.1 11/30/2012   HCT 37.2 11/30/2012   MCV 86.1 11/30/2012   PLT 475* 11/30/2012      Component Value Date/Time   NA 137  11/30/2012 1041   K 3.7 11/30/2012 1041   CL 100 11/30/2012 1041   CO2 25 11/30/2012 1041   GLUCOSE 98 11/30/2012 1041   BUN 15 11/30/2012 1041   CREATININE 0.87 11/30/2012 1041   CALCIUM 9.9 11/30/2012 1041   PROT 5.9* 11/10/2012 0459   ALBUMIN 2.6* 11/10/2012 0459   AST 14 11/10/2012 0459   ALT 18 11/10/2012 0459   ALKPHOS 130* 11/10/2012 0459   BILITOT 0.6 11/10/2012 0459   GFRNONAA 72* 11/30/2012 1041   GFRAA 83* 11/30/2012 1041      Lab Results  Component Value Date   TSH 0.907 11/08/2012      ASSESSMENT AND PLAN  57 y.o. year old female  has a past medical history of DVT (deep venous thrombosis); CREST syndrome; Pericardial effusion; MVP (mitral valve prolapse); Antiphospholipid antibody syndrome; ITP (idiopathic thrombocytopenic purpura); Raynaud's phenomenon; Hypertension; Paroxysmal atrial fibrillation; Paroxysmal atrial fibrillation (09/18/2011); Antiphospholipid antibody positive (10/10/2012); Hemorrhagic pericardial effusion (11/07/2012); CREST syndrome (11/08/2012); Recurrent deep venous thrombosis (11/08/2012); and Thyroid goiter. here to followup for facial paresthesias on the right. Increase Gabapentin to 400mg   Three times daily RX to patient PCP may take over prescribing if agreeable F/U prn Patient will be assigned to Dr. Tharon Aquas, [facility], [facility], APRN  [facility] [address], [address] [place], [place] [address] [phone]

## 2013-06-12 ENCOUNTER — Other Ambulatory Visit: Payer: Self-pay

## 2013-06-21 ENCOUNTER — Telehealth: Payer: Self-pay | Admitting: Physician Assistant

## 2013-06-21 NOTE — Telephone Encounter (Signed)
Patient with a hx paroxysmal AFib and hemopericardium while on coumadin after suffering a fall earlier this year.  She is s/p pericardial window.  She felt like she went into AFib with HR 140s earlier today.  She felt lightheaded and short of breath.  She took metoprolol 25 mg x 1.  She now feels like she is back in NSR.  HR is regular. I asked her to resume taking Metoprolol Tartrate 12.5 mg bid.  She can take an extra 12.5-25 mg prn. I will have Dr. Fayrene Fearing Hochrein's nurse call her on Monday to see how she is doing. Signed, Tereso Newcomer, PA-C   06/21/2013 2:59 PM

## 2013-06-23 NOTE — Telephone Encounter (Signed)
Spoke with pt who states she has not had any more episodes of At Fib.  Feels better taking metoprolol 12.5 mg BID.  Would like to know when she should follow up.  Saw Dr Antoine Poche in October and was going to follow up as needed.

## 2013-07-17 NOTE — Progress Notes (Signed)
I reviewed note and agree with plan.   Suanne Marker, MD 07/17/2013, 12:43 PM Certified in Neurology, Neurophysiology and Neuroimaging  [facility] [address], [address] [place], [place] [address] [phone]

## 2013-08-21 ENCOUNTER — Other Ambulatory Visit: Payer: Self-pay | Admitting: *Deleted

## 2013-08-21 ENCOUNTER — Ambulatory Visit (INDEPENDENT_AMBULATORY_CARE_PROVIDER_SITE_OTHER)
Admission: RE | Admit: 2013-08-21 | Discharge: 2013-08-21 | Disposition: A | Discharge status: Home / Self Care | Payer: 59 | Source: Ambulatory Visit | Attending: Pulmonary Disease | Admitting: Pulmonary Disease

## 2013-08-21 ENCOUNTER — Ambulatory Visit (INDEPENDENT_AMBULATORY_CARE_PROVIDER_SITE_OTHER): Payer: 59 | Admitting: Pulmonary Disease

## 2013-08-21 ENCOUNTER — Encounter: Payer: Self-pay | Admitting: Pulmonary Disease

## 2013-08-21 VITALS — BP 128/70 | HR 64 | Temp 98.4°F | Ht 66.0 in | Wt 181.6 lb

## 2013-08-21 DIAGNOSIS — R0989 Other specified symptoms and signs involving the circulatory and respiratory systems: Secondary | ICD-10-CM

## 2013-08-21 DIAGNOSIS — R894 Abnormal immunological findings in specimens from other organs, systems and tissues: Secondary | ICD-10-CM

## 2013-08-21 DIAGNOSIS — R0600 Dyspnea, unspecified: Secondary | ICD-10-CM

## 2013-08-21 DIAGNOSIS — R0609 Other forms of dyspnea: Secondary | ICD-10-CM

## 2013-08-21 DIAGNOSIS — R760 Raised antibody titer: Secondary | ICD-10-CM

## 2013-08-21 DIAGNOSIS — M341 CR(E)ST syndrome: Secondary | ICD-10-CM

## 2013-08-21 DIAGNOSIS — M349 Systemic sclerosis, unspecified: Secondary | ICD-10-CM

## 2013-08-21 NOTE — Assessment & Plan Note (Signed)
The patient has progressive dyspnea on exertion over the last 2 years, and has a very complicated medical history which may be contributing to this. She has a history of crest, history of pleuropericarditis, and history of recurrent DVT with antiphospholipid antibody syndrome.  Surprisingly, her last echo in April of last year did not show significant pulmonary hypertension. I am concerned about the possibility of chronic thromboembolic disease, and thinks that she needs a lung perfusion scan if PFTs are unremarkable. Her CT of the chest last year did not show any evidence for interstitial lung disease or fibrosis. Her lungs are fairly clear on exam today. We will start with a plain chest x-ray, PFTs, and then a lung perfusion scan if her other studies are unremarkable. He has nothing specific is found, I would encourage her to work aggressively on weight loss and conditioning. If she continues to have progressive dyspnea, she may need a followup CT chest and echocardiogram to rule out pulmonary hypertension.

## 2013-08-21 NOTE — Patient Instructions (Signed)
Will check regular chest xray today. Will schedule for breathing studies, and call you with results. If the above studies are unremarkable, would check lung perfusion scan to make sure you do not have chronic thromboembolic disease as the cause of your symptoms.  Work on weight loss and conditioning.

## 2013-08-21 NOTE — Progress Notes (Signed)
   Subjective:    Patient ID: [name], female    DOB: 1956-01-31, 58 y.o.   MRN: [id number]  HPI The patient is a very pleasant 58 year old female who I've been asked to see for management of dyspnea on exertion. She was diagnosed with crest syndrome many years ago, and has done very well overall. She notes progressive dyspnea on exertion over the last 2 years, but continues to be fairly active. She tries to exercise as much as possible, but this is starting to become limited by her dyspnea. She notes a one block dyspnea on exertion at a moderate pace on flat ground, but also gets claudication as well. She will get winded walking up one flight of stairs and doing light housework for a prolonged period of time, but has no issues bringing groceries in from the car. She denies any significant cough or reflux issues at this time, but occasionally may have swallowing issues. She has a history of PERRL pericarditis in April of last year, which required a pericardial window for drainage. Her followup echo showed no recurrence of effusion, and no definite pulmonary hypertension. She also has a history of the antiphospholipid antibody syndrome, and has had recurrent DVTs. She is currently on chronic anticoagulation for this. She has never had a pulmonary embolus, but has not had an evaluation for chronic thromboembolic disease. Most recently she has had a few bouts of pneumonia, and she is beginning to wonder if this is related to silent aspiration.   Review of Systems  Constitutional: Negative for fever and unexpected weight change.  HENT: Negative for congestion, dental problem, ear pain, nosebleeds, postnasal drip, rhinorrhea, sinus pressure, sneezing, sore throat and trouble swallowing.   Eyes: Negative for redness and itching.  Respiratory: Positive for shortness of breath. Negative for cough, chest tightness and wheezing.   Cardiovascular: Positive for palpitations ( irregular heartbeats/Afib).  Negative for leg swelling.  Gastrointestinal: Negative for nausea and vomiting.  Genitourinary: Negative for dysuria.  Musculoskeletal: Negative for joint swelling.  Skin: Negative for rash.  Neurological: Negative for headaches.  Hematological: Does not bruise/bleed easily.  Psychiatric/Behavioral: Negative for dysphoric mood. The patient is not nervous/anxious.        Objective:   Physical Exam Constitutional:  Overweight female, no acute distress  HENT:  Nares patent without discharge  Oropharynx without exudate, palate and uvula are normal  Eyes:  Perrla, eomi, no scleral icterus  Neck:  No JVD, mild enlargement of thyroid along left border.  Cardiovascular:  Normal rate, regular rhythm, no rubs or gallops.  No murmurs        Intact distal pulses  Pulmonary :  Normal breath sounds, no stridor or respiratory distress   No rales, rhonchi, or wheezing  Abdominal:  Soft, nondistended, bowel sounds present.  No tenderness noted.   Musculoskeletal:  minimal lower extremity edema noted.  Lymph Nodes:  No cervical lymphadenopathy noted  Skin:  No cyanosis noted  Neurologic:  Alert, appropriate, moves all 4 extremities without obvious deficit.         Assessment & Plan:

## 2013-08-25 ENCOUNTER — Encounter: Payer: Self-pay | Admitting: Pulmonary Disease

## 2013-08-26 ENCOUNTER — Telehealth: Payer: Self-pay | Admitting: Pulmonary Disease

## 2013-08-26 NOTE — Telephone Encounter (Signed)
Notes Recorded by Kathee Delton, MD on 08/22/2013 at 5:48 PM I am not saying she needs a ct chest. I am saying she has some increased markings that are unlikely to be fibrosis because her ct chest 8 mos ago did not show scarring. She may need another ct at some point if we do not find another explanation for her sob. Lets start with the PFT's first --  Spoke with pt and she is aware. Nothing further needed

## 2013-09-08 ENCOUNTER — Other Ambulatory Visit: Payer: Self-pay | Admitting: Physician Assistant

## 2013-09-10 ENCOUNTER — Other Ambulatory Visit: Payer: Self-pay

## 2013-09-10 MED ORDER — METOPROLOL TARTRATE 25 MG PO TABS: ORAL_TABLET | ORAL | Status: DC

## 2013-09-19 ENCOUNTER — Ambulatory Visit (INDEPENDENT_AMBULATORY_CARE_PROVIDER_SITE_OTHER): Payer: 59 | Admitting: Pulmonary Disease

## 2013-09-19 DIAGNOSIS — R0609 Other forms of dyspnea: Secondary | ICD-10-CM

## 2013-09-19 DIAGNOSIS — R0600 Dyspnea, unspecified: Secondary | ICD-10-CM

## 2013-09-19 DIAGNOSIS — R0989 Other specified symptoms and signs involving the circulatory and respiratory systems: Secondary | ICD-10-CM

## 2013-09-19 LAB — PULMONARY FUNCTION TEST
DL/VA % PRED: 101 %
DL/VA: 5.01 ml/min/mmHg/L
DLCO unc % pred: 70 %
DLCO unc: 18.14 ml/min/mmHg
FEF 25-75 Post: 1.95 L/sec
FEF 25-75 Pre: 1.74 L/sec
FEF2575-%CHANGE-POST: 12 %
FEF2575-%PRED-PRE: 70 %
FEF2575-%Pred-Post: 79 %
FEV1-%CHANGE-POST: 1 %
FEV1-%PRED-POST: 77 %
FEV1-%PRED-PRE: 76 %
FEV1-PRE: 2.05 L
FEV1-Post: 2.08 L
FEV1FVC-%Change-Post: 4 %
FEV1FVC-%PRED-PRE: 100 %
FEV6-%Change-Post: -3 %
FEV6-%Pred-Post: 74 %
FEV6-%Pred-Pre: 78 %
FEV6-Post: 2.5 L
FEV6-Pre: 2.61 L
FEV6FVC-%PRED-PRE: 103 %
FEV6FVC-%Pred-Post: 103 %
FVC-%Change-Post: -2 %
FVC-%Pred-Post: 73 %
FVC-%Pred-Pre: 75 %
FVC-Post: 2.54 L
FVC-Pre: 2.61 L
POST FEV1/FVC RATIO: 82 %
PRE FEV6/FVC RATIO: 100 %
Post FEV6/FVC ratio: 100 %
Pre FEV1/FVC ratio: 79 %
RV % pred: 59 %
RV: 1.21 L
TLC % PRED: 95 %
TLC: 4.96 L

## 2013-09-19 NOTE — Progress Notes (Signed)
PFT done today. 

## 2013-09-29 ENCOUNTER — Encounter: Payer: Self-pay | Admitting: Radiology

## 2013-09-30 ENCOUNTER — Telehealth: Payer: Self-pay

## 2013-09-30 ENCOUNTER — Other Ambulatory Visit: Payer: Self-pay | Admitting: Pulmonary Disease

## 2013-09-30 DIAGNOSIS — I82409 Acute embolism and thrombosis of unspecified deep veins of unspecified lower extremity: Secondary | ICD-10-CM

## 2013-09-30 DIAGNOSIS — R760 Raised antibody titer: Secondary | ICD-10-CM

## 2013-09-30 NOTE — Telephone Encounter (Signed)
Spoke with pt, she is aware of results and recs.  She wants to proceed with the scan.  Dr Gwenette Greet please advise on what you would like me to order for this patient.  Thank you.

## 2013-09-30 NOTE — Telephone Encounter (Signed)
Pt needs a VQ scan per [place]. This has already been placed. Pt would like this as late in the day as possible.

## 2013-09-30 NOTE — Telephone Encounter (Signed)
Message copied by Len Blalock on Tue Sep 30, 2013 10:01 AM ------      Message from: [place]      Created: Mon Sep 29, 2013  1:59 PM       Please let pt know that her breathing studies do not show any copd or asthma, her lung volumes/size are normal.  The only abnormality is a very mild reduction in how well her oxygen gets from her air sacs into the bloodstream.  This is really a minimal abnormality.  I would recommend having the lung scan as we discussed at the visit, to check for blood clots in the lung.  Will order if she is ok with this. ------

## 2013-09-30 NOTE — Telephone Encounter (Signed)
Please call the patient back/cb

## 2013-10-03 ENCOUNTER — Ambulatory Visit (HOSPITAL_COMMUNITY): Payer: 59

## 2013-10-06 ENCOUNTER — Encounter: Payer: Self-pay | Admitting: Oncology

## 2013-10-06 ENCOUNTER — Ambulatory Visit (HOSPITAL_COMMUNITY)
Admission: RE | Admit: 2013-10-06 | Discharge: 2013-10-06 | Disposition: A | Discharge status: Home / Self Care | Payer: 59 | Source: Ambulatory Visit | Attending: Pulmonary Disease | Admitting: Pulmonary Disease

## 2013-10-06 ENCOUNTER — Telehealth: Payer: Self-pay | Admitting: Pulmonary Disease

## 2013-10-06 ENCOUNTER — Encounter: Payer: Self-pay | Admitting: Pulmonary Disease

## 2013-10-06 DIAGNOSIS — I82409 Acute embolism and thrombosis of unspecified deep veins of unspecified lower extremity: Secondary | ICD-10-CM

## 2013-10-06 DIAGNOSIS — R760 Raised antibody titer: Secondary | ICD-10-CM

## 2013-10-06 DIAGNOSIS — R0602 Shortness of breath: Secondary | ICD-10-CM | POA: Insufficient documentation

## 2013-10-06 DIAGNOSIS — J984 Other disorders of lung: Secondary | ICD-10-CM | POA: Insufficient documentation

## 2013-10-06 MED ORDER — TECHNETIUM TO 99M ALBUMIN AGGREGATED
5.2000 | Freq: Once | INTRAVENOUS | Status: AC | PRN
  Administered 2013-10-06: 5 via INTRAVENOUS

## 2013-10-06 MED ORDER — TECHNETIUM TC 99M DIETHYLENETRIAME-PENTAACETIC ACID: 44.0000 | Freq: Once | INTRAVENOUS | Status: DC | PRN

## 2013-10-06 NOTE — Telephone Encounter (Signed)
Pt is aware of PFT results. States that she spoke with [facility] already and they have a treatment plan in place. Nothing further is needed.

## 2013-10-07 ENCOUNTER — Other Ambulatory Visit (HOSPITAL_COMMUNITY): Payer: Self-pay

## 2013-10-29 ENCOUNTER — Encounter (HOSPITAL_COMMUNITY): Payer: Self-pay

## 2013-11-25 ENCOUNTER — Other Ambulatory Visit (HOSPITAL_COMMUNITY): Payer: Self-pay | Admitting: Cardiology

## 2013-11-25 DIAGNOSIS — I739 Peripheral vascular disease, unspecified: Secondary | ICD-10-CM

## 2013-11-26 ENCOUNTER — Ambulatory Visit (HOSPITAL_COMMUNITY): Payer: 59 | Attending: Cardiovascular Disease | Admitting: *Deleted

## 2013-11-26 ENCOUNTER — Other Ambulatory Visit (HOSPITAL_COMMUNITY): Payer: Self-pay | Admitting: *Deleted

## 2013-11-26 ENCOUNTER — Encounter: Payer: Self-pay | Admitting: Cardiovascular Disease

## 2013-11-26 DIAGNOSIS — I70219 Atherosclerosis of native arteries of extremities with intermittent claudication, unspecified extremity: Secondary | ICD-10-CM

## 2013-11-26 DIAGNOSIS — I739 Peripheral vascular disease, unspecified: Secondary | ICD-10-CM

## 2013-11-26 NOTE — Progress Notes (Signed)
Lower Arterial Doppler and Duplex Complete

## 2014-03-02 ENCOUNTER — Other Ambulatory Visit: Payer: Self-pay | Admitting: Oncology

## 2014-03-19 ENCOUNTER — Other Ambulatory Visit: Payer: Self-pay | Admitting: Cardiology

## 2014-04-02 NOTE — Telephone Encounter (Signed)
Encounter was telephone call. 

## 2014-05-26 ENCOUNTER — Telehealth: Payer: Self-pay

## 2014-05-27 NOTE — Telephone Encounter (Signed)
OK to change to Toprol XL 25 mg daily.  One po daily.  Disp number 90 with 3 refills.  Stop previous metoprolol prescription.

## 2014-05-28 ENCOUNTER — Other Ambulatory Visit: Payer: Self-pay

## 2014-05-28 MED ORDER — METOPROLOL SUCCINATE ER 25 MG PO TB24: 25.0000 mg | ORAL_TABLET | Freq: Every day | ORAL | Status: DC

## 2014-06-12 ENCOUNTER — Ambulatory Visit (INDEPENDENT_AMBULATORY_CARE_PROVIDER_SITE_OTHER): Payer: 59 | Admitting: Cardiology

## 2014-06-12 ENCOUNTER — Encounter: Payer: Self-pay | Admitting: Cardiology

## 2014-06-12 VITALS — BP 130/70 | HR 64 | Ht 66.0 in | Wt 184.0 lb

## 2014-06-12 DIAGNOSIS — I313 Pericardial effusion (noninflammatory): Secondary | ICD-10-CM

## 2014-06-12 DIAGNOSIS — I319 Disease of pericardium, unspecified: Secondary | ICD-10-CM

## 2014-06-12 DIAGNOSIS — I10 Essential (primary) hypertension: Secondary | ICD-10-CM

## 2014-06-12 DIAGNOSIS — I3139 Other pericardial effusion (noninflammatory): Secondary | ICD-10-CM

## 2014-06-12 MED ORDER — METOPROLOL SUCCINATE ER 25 MG PO TB24: 25.0000 mg | ORAL_TABLET | Freq: Every day | ORAL | Status: DC

## 2014-06-12 NOTE — Patient Instructions (Signed)
Continue same medications    Your physician recommends that you schedule a follow-up appointment in: 1 year.

## 2014-06-12 NOTE — Progress Notes (Signed)
HPI The patient presents for an add-on evaluation of hemorrhagic pericardial effusion related to trauma.  She's not describing any cardiovascular symptoms. She denies chest pressure, neck or arm discomfort. She's had no palpitations, presyncope or syncope. She has had no PND or orthopnea.  She goes up and down stairs a few times per day.    Allergies  Allergen Reactions  . Demerol   . Codeine Nausea And Vomiting  . Ultram [Tramadol Hcl] Other (See Comments)    Makes her hands shake.    Current Outpatient Prescriptions  Medication Sig Dispense Refill  . ciprofloxacin (CIPRO) 500 MG tablet Take 500 mg by mouth 2 (two) times daily as needed (diarrhea).    . esomeprazole (NEXIUM) 40 MG capsule Take 40 mg by mouth 2 (two) times daily.    . furosemide (LASIX) 40 MG tablet TAKE 1 TABLET BY MOUTH DAILY 90 tablet 3  . gabapentin (NEURONTIN) 400 MG capsule Take 1 capsule (400 mg total) by mouth 3 (three) times daily. 270 capsule 3  . ipratropium (ATROVENT) 0.06 % nasal spray Place 2 sprays into the nose 4 (four) times daily as needed for rhinitis.    Marland Kitchen loteprednol (ALREX) 0.2 % SUSP Place 1 drop into both eyes as needed.     . metoprolol succinate (TOPROL XL) 25 MG 24 hr tablet Take 1 tablet (25 mg total) by mouth daily. 30 tablet 6  . mometasone (NASONEX) 50 MCG/ACT nasal spray Place 2 sprays into the nose daily as needed.     Vladimir Faster Glycol-Propyl Glycol (SYSTANE ULTRA OP) Apply 1 drop to eye 4 (four) times daily as needed (dry eyes).    . potassium chloride SA (K-DUR,KLOR-CON) 20 MEQ tablet TAKE 1 TABLET BY MOUTH DAILY 90 tablet 3  . progesterone (PROMETRIUM) 100 MG capsule Take 100 mg by mouth at bedtime.     . rifaximin (XIFAXAN) 550 MG TABS Take 550 mg by mouth daily.    . Risedronate Sodium (ACTONEL PO) Take 1 tablet by mouth every 30 (thirty) days.    Marland Kitchen warfarin (COUMADIN) 7.5 MG tablet Take 7.5 mg by mouth daily. everyday except for 5 mgm on T/TH     No current  facility-administered medications for this visit.    Past Medical History  Diagnosis Date  . DVT (deep venous thrombosis)   . CREST syndrome   . Pericardial effusion   . MVP (mitral valve prolapse)   . Antiphospholipid antibody syndrome   . ITP (idiopathic thrombocytopenic purpura)   . Raynaud's phenomenon   . Hypertension   . Paroxysmal atrial fibrillation   . Paroxysmal atrial fibrillation 09/18/2011  . Antiphospholipid antibody positive 10/10/2012  . Hemorrhagic pericardial effusion 11/07/2012  . CREST syndrome 11/08/2012  . 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 Thyroid goiter     Past Surgical History  Procedure Laterality Date  . Splenectomy      ITP  . Uterine ablation    . Tubal ligation    . Subxyphoid pericardial window N/A 11/08/2012    Procedure: SUBXYPHOID PERICARDIAL WINDOW;  Surgeon: Rexene Alberts, MD;  Location: [facility] OR;  Service: Thoracic;  Laterality: N/A;   ROS:  As stated in the HPI and negative for all other systems.  PHYSICAL EXAM BP 130/70 mmHg  Pulse 64  Ht 5\' 6"  (1.676 m)  Wt 184 lb (83.462 kg)  BMI 29.71 kg/m2 GENERAL:  Well appearing NECK:  No jugular venous distention, waveform within normal limits, carotid upstroke brisk and symmetric,  no bruits, no thyromegaly LUNGS:  Clear to auscultation bilaterally BACK:  No CVA tenderness CHEST:  Unremarkable HEART:  PMI not displaced or sustained,S1 and S2 within normal limits, no S3, no S4, no clicks, no rubs, no murmurs ABD:  Flat, positive bowel sounds normal in frequency in pitch, no bruits, no rebound, no guarding, no midline pulsatile mass, no hepatomegaly, no splenomegaly EXT:  2 plus pulses throughout, no edema, no cyanosis no clubbing  EKG:  Sinus rhythm, rate 64, axis within normal limits, intervals within normal limits, no acute ST-T wave changes. 06/12/2014  ASSESSMENT AND PLAN  PERICARDIAL EFFUSION:  There is no indication of recurrent effusion.  No further work up is planned.     ATRIAL FIBRILLATION:  She has had no recurrent symptomatic tachycardia arrhythmias. She will remain on anticoagulation with a history of recurrent DVT.  She will remain off aspirin.

## 2014-06-25 ENCOUNTER — Other Ambulatory Visit: Payer: Self-pay | Admitting: Nurse Practitioner

## 2014-06-25 NOTE — Telephone Encounter (Signed)
Former [facility] patient assigned to Dr Leta Baptist per last OV.

## 2014-07-23 ENCOUNTER — Other Ambulatory Visit: Payer: Self-pay | Admitting: Obstetrics and Gynecology

## 2014-07-27 LAB — CYTOLOGY - PAP

## 2014-08-04 ENCOUNTER — Other Ambulatory Visit: Payer: Self-pay | Admitting: Diagnostic Neuroimaging

## 2014-08-04 NOTE — Telephone Encounter (Signed)
Patient has appt scheduled

## 2014-10-13 ENCOUNTER — Ambulatory Visit: Payer: Self-pay | Admitting: Nurse Practitioner

## 2014-12-17 ENCOUNTER — Other Ambulatory Visit: Payer: Self-pay | Admitting: Diagnostic Neuroimaging

## 2014-12-17 NOTE — Telephone Encounter (Signed)
Patient has not been seen since 2014.

## 2015-03-16 ENCOUNTER — Other Ambulatory Visit: Payer: Self-pay | Admitting: Cardiology

## 2015-03-16 NOTE — Telephone Encounter (Signed)
Rx(s) sent to pharmacy electronically.  

## 2015-03-18 ENCOUNTER — Other Ambulatory Visit: Payer: Self-pay | Admitting: Cardiology

## 2015-03-18 NOTE — Telephone Encounter (Signed)
REFILL 

## 2015-08-11 DIAGNOSIS — Z7901 Long term (current) use of anticoagulants: Secondary | ICD-10-CM | POA: Diagnosis not present

## 2015-08-11 MED FILL — NexIUM 40 MG CPDR: 40 | 45 days supply | Qty: 90 | Fill #3

## 2015-08-12 DIAGNOSIS — H52223 Regular astigmatism, bilateral: Secondary | ICD-10-CM | POA: Diagnosis not present

## 2015-08-12 DIAGNOSIS — H524 Presbyopia: Secondary | ICD-10-CM | POA: Diagnosis not present

## 2015-08-12 DIAGNOSIS — H5213 Myopia, bilateral: Secondary | ICD-10-CM | POA: Diagnosis not present

## 2015-08-13 MED FILL — ZYLET EYE DROPS: 0.5-0.3 | 14 days supply | Qty: 5 | Fill #0

## 2015-08-13 MED FILL — RESTASIS 0.05% EYE EMULSION: 0.05 | 90 days supply | Qty: 180 | Fill #0

## 2015-08-18 MED FILL — GABAPENTIN 600 MG TABLET: 600 | 30 days supply | Qty: 120 | Fill #2

## 2015-08-23 DIAGNOSIS — I480 Paroxysmal atrial fibrillation: Secondary | ICD-10-CM | POA: Diagnosis not present

## 2015-08-23 DIAGNOSIS — Z7901 Long term (current) use of anticoagulants: Secondary | ICD-10-CM | POA: Diagnosis not present

## 2015-08-25 DIAGNOSIS — R6882 Decreased libido: Secondary | ICD-10-CM | POA: Diagnosis not present

## 2015-08-30 MED FILL — PROGESTERONE 100 MG CAPSULE: 100 | 90 days supply | Qty: 90 | Fill #0

## 2015-09-02 DIAGNOSIS — I480 Paroxysmal atrial fibrillation: Secondary | ICD-10-CM | POA: Diagnosis not present

## 2015-09-02 DIAGNOSIS — Z7901 Long term (current) use of anticoagulants: Secondary | ICD-10-CM | POA: Diagnosis not present

## 2015-09-02 MED FILL — WARFARIN SODIUM 5 MG TABLET: 5 | 25 days supply | Qty: 50 | Fill #0

## 2015-09-16 DIAGNOSIS — G509 Disorder of trigeminal nerve, unspecified: Secondary | ICD-10-CM | POA: Diagnosis not present

## 2015-09-16 DIAGNOSIS — M341 CR(E)ST syndrome: Secondary | ICD-10-CM | POA: Diagnosis not present

## 2015-09-16 DIAGNOSIS — Z7901 Long term (current) use of anticoagulants: Secondary | ICD-10-CM | POA: Diagnosis not present

## 2015-09-22 DIAGNOSIS — M7742 Metatarsalgia, left foot: Secondary | ICD-10-CM | POA: Diagnosis not present

## 2015-09-22 DIAGNOSIS — M722 Plantar fascial fibromatosis: Secondary | ICD-10-CM | POA: Diagnosis not present

## 2015-09-22 DIAGNOSIS — Q667 Congenital pes cavus: Secondary | ICD-10-CM | POA: Diagnosis not present

## 2015-09-22 DIAGNOSIS — M7741 Metatarsalgia, right foot: Secondary | ICD-10-CM | POA: Diagnosis not present

## 2015-09-22 DIAGNOSIS — M79672 Pain in left foot: Secondary | ICD-10-CM | POA: Diagnosis not present

## 2015-09-23 DIAGNOSIS — R6882 Decreased libido: Secondary | ICD-10-CM | POA: Diagnosis not present

## 2015-10-06 DIAGNOSIS — R509 Fever, unspecified: Secondary | ICD-10-CM | POA: Diagnosis not present

## 2015-10-06 DIAGNOSIS — I517 Cardiomegaly: Secondary | ICD-10-CM | POA: Diagnosis not present

## 2015-10-06 DIAGNOSIS — R05 Cough: Secondary | ICD-10-CM | POA: Diagnosis not present

## 2015-10-06 DIAGNOSIS — R0781 Pleurodynia: Secondary | ICD-10-CM | POA: Diagnosis not present

## 2015-10-06 DIAGNOSIS — Z7901 Long term (current) use of anticoagulants: Secondary | ICD-10-CM | POA: Diagnosis not present

## 2015-10-06 DIAGNOSIS — Z01419 Encounter for gynecological examination (general) (routine) without abnormal findings: Secondary | ICD-10-CM | POA: Diagnosis not present

## 2015-10-06 DIAGNOSIS — Z1231 Encounter for screening mammogram for malignant neoplasm of breast: Secondary | ICD-10-CM | POA: Diagnosis not present

## 2015-10-06 DIAGNOSIS — Z6830 Body mass index (BMI) 30.0-30.9, adult: Secondary | ICD-10-CM | POA: Diagnosis not present

## 2015-10-06 DIAGNOSIS — J9811 Atelectasis: Secondary | ICD-10-CM | POA: Diagnosis not present

## 2015-10-06 MED FILL — IPRATROPIUM 0.03% SPRAY: 0.03 | 30 days supply | Qty: 30 | Fill #0 | Status: TO

## 2015-10-06 MED FILL — KLOR-CON M20 TABLET: 20 | 90 days supply | Qty: 90 | Fill #0

## 2015-10-06 MED FILL — GABAPENTIN 600 MG TABLET: 600 | 90 days supply | Qty: 360 | Fill #0

## 2015-10-06 MED FILL — FUROSEMIDE 40 MG TABLET: 40 | 90 days supply | Qty: 180 | Fill #0

## 2015-10-06 MED FILL — NexIUM 40 MG CPDR: 40 | 90 days supply | Qty: 180 | Fill #4

## 2015-10-06 MED FILL — MOMETASONE FUROATE 50 MCG S: 50 | 30 days supply | Qty: 17 | Fill #0 | Status: TO

## 2015-10-07 MED FILL — CIPROFLOXACIN HCL 500 MG TA: 500 | 90 days supply | Qty: 180 | Fill #0

## 2015-10-22 ENCOUNTER — Ambulatory Visit: Payer: 59 | Admitting: Cardiology

## 2015-10-26 DIAGNOSIS — I7300 Raynaud's syndrome without gangrene: Secondary | ICD-10-CM | POA: Diagnosis not present

## 2015-10-26 DIAGNOSIS — K219 Gastro-esophageal reflux disease without esophagitis: Secondary | ICD-10-CM | POA: Diagnosis not present

## 2015-10-26 DIAGNOSIS — M150 Primary generalized (osteo)arthritis: Secondary | ICD-10-CM | POA: Diagnosis not present

## 2015-10-26 DIAGNOSIS — M8580 Other specified disorders of bone density and structure, unspecified site: Secondary | ICD-10-CM | POA: Diagnosis not present

## 2015-10-26 DIAGNOSIS — E559 Vitamin D deficiency, unspecified: Secondary | ICD-10-CM | POA: Diagnosis not present

## 2015-10-26 DIAGNOSIS — M340 Progressive systemic sclerosis: Secondary | ICD-10-CM | POA: Diagnosis not present

## 2015-10-26 DIAGNOSIS — D693 Immune thrombocytopenic purpura: Secondary | ICD-10-CM | POA: Diagnosis not present

## 2015-10-26 MED FILL — METOPROLOL TARTRATE 25 MG T: 25 | 90 days supply | Qty: 180 | Fill #0

## 2016-08-07 HISTORY — PX: SQUAMOUS CELL CARCINOMA EXCISION: SHX2433

## 2017-03-15 DIAGNOSIS — Z7901 Long term (current) use of anticoagulants: Secondary | ICD-10-CM | POA: Diagnosis not present

## 2017-03-21 DIAGNOSIS — R6882 Decreased libido: Secondary | ICD-10-CM | POA: Diagnosis not present

## 2017-03-21 DIAGNOSIS — M818 Other osteoporosis without current pathological fracture: Secondary | ICD-10-CM | POA: Diagnosis not present

## 2017-04-03 DIAGNOSIS — Z7901 Long term (current) use of anticoagulants: Secondary | ICD-10-CM | POA: Diagnosis not present

## 2017-04-04 DIAGNOSIS — C44722 Squamous cell carcinoma of skin of right lower limb, including hip: Secondary | ICD-10-CM | POA: Diagnosis not present

## 2017-04-04 DIAGNOSIS — L905 Scar conditions and fibrosis of skin: Secondary | ICD-10-CM | POA: Diagnosis not present

## 2017-04-05 MED FILL — ESOMEPRAZOLE MAG DR 40 MG C: 40 | 30 days supply | Qty: 60 | Fill #0

## 2017-04-05 MED FILL — POTASSIUM CL ER 20 MEQ TAB: 20 | 60 days supply | Qty: 60 | Fill #0

## 2017-04-05 MED FILL — MOMETASONE FUROATE 50 MCG S: 50 | 30 days supply | Qty: 17 | Fill #0

## 2017-04-06 MED FILL — PROGESTERONE 100 MG CAPSULE: 100 | 90 days supply | Qty: 90 | Fill #0

## 2017-04-06 MED FILL — FUROSEMIDE 40 MG TAB: 40 | 90 days supply | Qty: 180 | Fill #0

## 2017-04-06 MED FILL — CIPROFLOXACIN HCL 500 MG TA: 500 | 90 days supply | Qty: 180 | Fill #0

## 2017-04-06 MED FILL — GABAPENTIN 600 MG TABLET: 600 | 90 days supply | Qty: 360 | Fill #0

## 2017-04-17 MED FILL — ENOXAPARIN 80 MG/0.8 ML SYR: 80 | 10 days supply | Qty: 16 | Fill #0

## 2017-04-17 MED FILL — METOPROLOL SUCC ER 25 MG TA: 25 | 90 days supply | Qty: 45 | Fill #0

## 2017-04-17 MED FILL — NIFEDIPINE ER 30 MG TABLET: 30 | 30 days supply | Qty: 30 | Fill #0

## 2017-04-17 MED FILL — HYDROCODON-APAP 5-325: 5-325 | 7 days supply | Qty: 30 | Fill #0

## 2017-04-23 DIAGNOSIS — I519 Heart disease, unspecified: Secondary | ICD-10-CM | POA: Diagnosis not present

## 2017-04-23 DIAGNOSIS — I517 Cardiomegaly: Secondary | ICD-10-CM | POA: Diagnosis not present

## 2017-04-23 DIAGNOSIS — I253 Aneurysm of heart: Secondary | ICD-10-CM | POA: Diagnosis not present

## 2017-04-23 DIAGNOSIS — I081 Rheumatic disorders of both mitral and tricuspid valves: Secondary | ICD-10-CM | POA: Diagnosis not present

## 2017-04-27 DIAGNOSIS — R1310 Dysphagia, unspecified: Secondary | ICD-10-CM | POA: Diagnosis not present

## 2017-04-27 DIAGNOSIS — I10 Essential (primary) hypertension: Secondary | ICD-10-CM | POA: Diagnosis not present

## 2017-04-27 DIAGNOSIS — M1990 Unspecified osteoarthritis, unspecified site: Secondary | ICD-10-CM | POA: Diagnosis not present

## 2017-04-27 DIAGNOSIS — I480 Paroxysmal atrial fibrillation: Secondary | ICD-10-CM | POA: Diagnosis not present

## 2017-04-27 DIAGNOSIS — Z1211 Encounter for screening for malignant neoplasm of colon: Secondary | ICD-10-CM | POA: Diagnosis not present

## 2017-04-27 DIAGNOSIS — E559 Vitamin D deficiency, unspecified: Secondary | ICD-10-CM | POA: Diagnosis not present

## 2017-04-27 DIAGNOSIS — K219 Gastro-esophageal reflux disease without esophagitis: Secondary | ICD-10-CM | POA: Diagnosis not present

## 2017-04-27 DIAGNOSIS — Z86718 Personal history of other venous thrombosis and embolism: Secondary | ICD-10-CM | POA: Diagnosis not present

## 2017-04-27 DIAGNOSIS — M810 Age-related osteoporosis without current pathological fracture: Secondary | ICD-10-CM | POA: Diagnosis not present

## 2017-04-27 DIAGNOSIS — R197 Diarrhea, unspecified: Secondary | ICD-10-CM | POA: Diagnosis not present

## 2017-04-27 DIAGNOSIS — M341 CR(E)ST syndrome: Secondary | ICD-10-CM | POA: Diagnosis not present

## 2017-04-27 DIAGNOSIS — G508 Other disorders of trigeminal nerve: Secondary | ICD-10-CM | POA: Diagnosis not present

## 2017-05-02 DIAGNOSIS — Z7901 Long term (current) use of anticoagulants: Secondary | ICD-10-CM | POA: Diagnosis not present

## 2017-05-21 MED FILL — ESOMEPRAZOLE MAG DR 40 MG C: 40 | 30 days supply | Qty: 60 | Fill #1

## 2017-05-24 DIAGNOSIS — Z7901 Long term (current) use of anticoagulants: Secondary | ICD-10-CM | POA: Diagnosis not present

## 2017-05-24 DIAGNOSIS — M25552 Pain in left hip: Secondary | ICD-10-CM | POA: Diagnosis not present

## 2017-05-25 MED FILL — CELECOXIB 100 MG CAPSULE: 100 | 15 days supply | Qty: 30 | Fill #0

## 2017-06-05 MED FILL — POTASSIUM CL ER 20 MEQ TAB: 20 | 90 days supply | Qty: 90 | Fill #0

## 2017-06-14 MED FILL — ESOMEPRAZOLE MAG DR 40 MG C: 40 | 30 days supply | Qty: 60 | Fill #2

## 2017-06-14 MED FILL — NIFEDIPINE ER 30 MG TABLET: 30 | 30 days supply | Qty: 30 | Fill #1

## 2017-06-14 MED FILL — CELECOXIB 100 MG CAPSULE: 100 | 15 days supply | Qty: 30 | Fill #1

## 2017-06-18 DIAGNOSIS — I7300 Raynaud's syndrome without gangrene: Secondary | ICD-10-CM | POA: Diagnosis not present

## 2017-06-18 DIAGNOSIS — M150 Primary generalized (osteo)arthritis: Secondary | ICD-10-CM | POA: Diagnosis not present

## 2017-06-18 DIAGNOSIS — M810 Age-related osteoporosis without current pathological fracture: Secondary | ICD-10-CM | POA: Diagnosis not present

## 2017-06-18 DIAGNOSIS — M340 Progressive systemic sclerosis: Secondary | ICD-10-CM | POA: Diagnosis not present

## 2017-06-18 DIAGNOSIS — Z0000 Encounter for general adult medical examination without abnormal findings: Secondary | ICD-10-CM | POA: Diagnosis not present

## 2017-06-18 DIAGNOSIS — Z7901 Long term (current) use of anticoagulants: Secondary | ICD-10-CM | POA: Diagnosis not present

## 2017-06-18 DIAGNOSIS — Z6829 Body mass index (BMI) 29.0-29.9, adult: Secondary | ICD-10-CM | POA: Diagnosis not present

## 2017-06-18 DIAGNOSIS — M8580 Other specified disorders of bone density and structure, unspecified site: Secondary | ICD-10-CM | POA: Diagnosis not present

## 2017-06-18 DIAGNOSIS — E559 Vitamin D deficiency, unspecified: Secondary | ICD-10-CM | POA: Diagnosis not present

## 2017-06-18 DIAGNOSIS — Z9081 Acquired absence of spleen: Secondary | ICD-10-CM | POA: Diagnosis not present

## 2017-06-18 DIAGNOSIS — E663 Overweight: Secondary | ICD-10-CM | POA: Diagnosis not present

## 2017-06-18 DIAGNOSIS — R252 Cramp and spasm: Secondary | ICD-10-CM | POA: Diagnosis not present

## 2017-06-18 DIAGNOSIS — D693 Immune thrombocytopenic purpura: Secondary | ICD-10-CM | POA: Diagnosis not present

## 2017-06-18 DIAGNOSIS — K219 Gastro-esophageal reflux disease without esophagitis: Secondary | ICD-10-CM | POA: Diagnosis not present

## 2017-06-18 MED FILL — WARFARIN SODIUM 5 MG TABLET: 5 | 45 days supply | Qty: 90 | Fill #0

## 2017-06-19 DIAGNOSIS — Z7901 Long term (current) use of anticoagulants: Secondary | ICD-10-CM | POA: Diagnosis not present

## 2017-06-25 DIAGNOSIS — J111 Influenza due to unidentified influenza virus with other respiratory manifestations: Secondary | ICD-10-CM | POA: Diagnosis not present

## 2017-06-25 DIAGNOSIS — R509 Fever, unspecified: Secondary | ICD-10-CM | POA: Diagnosis not present

## 2017-06-27 ENCOUNTER — Other Ambulatory Visit: Payer: Self-pay

## 2017-06-27 ENCOUNTER — Encounter (HOSPITAL_COMMUNITY): Payer: Self-pay

## 2017-06-27 ENCOUNTER — Emergency Department (HOSPITAL_COMMUNITY): Payer: 59

## 2017-06-27 ENCOUNTER — Observation Stay (HOSPITAL_COMMUNITY)
Admission: EM | Admit: 2017-06-27 | Discharge: 2017-06-28 | Disposition: A | Discharge status: Home / Self Care | Payer: 59 | Attending: Internal Medicine | Admitting: Internal Medicine

## 2017-06-27 DIAGNOSIS — Z792 Long term (current) use of antibiotics: Secondary | ICD-10-CM

## 2017-06-27 DIAGNOSIS — R509 Fever, unspecified: Secondary | ICD-10-CM

## 2017-06-27 DIAGNOSIS — Z7901 Long term (current) use of anticoagulants: Secondary | ICD-10-CM

## 2017-06-27 DIAGNOSIS — J9601 Acute respiratory failure with hypoxia: Secondary | ICD-10-CM | POA: Insufficient documentation

## 2017-06-27 DIAGNOSIS — K902 Blind loop syndrome, not elsewhere classified: Secondary | ICD-10-CM | POA: Diagnosis not present

## 2017-06-27 DIAGNOSIS — Z836 Family history of other diseases of the respiratory system: Secondary | ICD-10-CM

## 2017-06-27 DIAGNOSIS — R232 Flushing: Secondary | ICD-10-CM

## 2017-06-27 DIAGNOSIS — R0600 Dyspnea, unspecified: Secondary | ICD-10-CM | POA: Diagnosis not present

## 2017-06-27 DIAGNOSIS — Z79899 Other long term (current) drug therapy: Secondary | ICD-10-CM | POA: Diagnosis not present

## 2017-06-27 DIAGNOSIS — M791 Myalgia, unspecified site: Secondary | ICD-10-CM

## 2017-06-27 DIAGNOSIS — I272 Pulmonary hypertension, unspecified: Secondary | ICD-10-CM | POA: Diagnosis not present

## 2017-06-27 DIAGNOSIS — Z86718 Personal history of other venous thrombosis and embolism: Secondary | ICD-10-CM

## 2017-06-27 DIAGNOSIS — I5033 Acute on chronic diastolic (congestive) heart failure: Secondary | ICD-10-CM | POA: Diagnosis not present

## 2017-06-27 DIAGNOSIS — I781 Nevus, non-neoplastic: Secondary | ICD-10-CM | POA: Diagnosis not present

## 2017-06-27 DIAGNOSIS — Z801 Family history of malignant neoplasm of trachea, bronchus and lung: Secondary | ICD-10-CM

## 2017-06-27 DIAGNOSIS — I4581 Long QT syndrome: Secondary | ICD-10-CM

## 2017-06-27 DIAGNOSIS — I878 Other specified disorders of veins: Secondary | ICD-10-CM | POA: Diagnosis not present

## 2017-06-27 DIAGNOSIS — R0602 Shortness of breath: Secondary | ICD-10-CM | POA: Diagnosis not present

## 2017-06-27 DIAGNOSIS — R918 Other nonspecific abnormal finding of lung field: Secondary | ICD-10-CM | POA: Diagnosis not present

## 2017-06-27 DIAGNOSIS — Z833 Family history of diabetes mellitus: Secondary | ICD-10-CM

## 2017-06-27 DIAGNOSIS — I110 Hypertensive heart disease with heart failure: Secondary | ICD-10-CM | POA: Diagnosis not present

## 2017-06-27 DIAGNOSIS — I4891 Unspecified atrial fibrillation: Secondary | ICD-10-CM | POA: Diagnosis not present

## 2017-06-27 DIAGNOSIS — M341 CR(E)ST syndrome: Secondary | ICD-10-CM | POA: Diagnosis not present

## 2017-06-27 DIAGNOSIS — I3139 Other pericardial effusion (noninflammatory): Secondary | ICD-10-CM

## 2017-06-27 DIAGNOSIS — Z83438 Family history of other disorder of lipoprotein metabolism and other lipidemia: Secondary | ICD-10-CM

## 2017-06-27 DIAGNOSIS — I7300 Raynaud's syndrome without gangrene: Secondary | ICD-10-CM | POA: Diagnosis not present

## 2017-06-27 DIAGNOSIS — J9691 Respiratory failure, unspecified with hypoxia: Secondary | ICD-10-CM | POA: Diagnosis present

## 2017-06-27 DIAGNOSIS — Z8249 Family history of ischemic heart disease and other diseases of the circulatory system: Secondary | ICD-10-CM

## 2017-06-27 DIAGNOSIS — B349 Viral infection, unspecified: Secondary | ICD-10-CM | POA: Diagnosis not present

## 2017-06-27 DIAGNOSIS — Z885 Allergy status to narcotic agent status: Secondary | ICD-10-CM

## 2017-06-27 DIAGNOSIS — I480 Paroxysmal atrial fibrillation: Secondary | ICD-10-CM | POA: Diagnosis not present

## 2017-06-27 DIAGNOSIS — I313 Pericardial effusion (noninflammatory): Secondary | ICD-10-CM

## 2017-06-27 HISTORY — DX: Cardiac murmur, unspecified: R01.1

## 2017-06-27 HISTORY — DX: Pneumonia, unspecified organism: J18.9

## 2017-06-27 HISTORY — DX: Unspecified osteoarthritis, unspecified site: M19.90

## 2017-06-27 HISTORY — DX: Unspecified chronic bronchitis: J42

## 2017-06-27 HISTORY — DX: Squamous cell carcinoma of skin of right lower limb, including hip: C44.722

## 2017-06-27 HISTORY — DX: Gastro-esophageal reflux disease without esophagitis: K21.9

## 2017-06-27 LAB — PROTIME-INR
INR: 2.73
Prothrombin Time: 28.7 seconds — ABNORMAL HIGH (ref 11.4–15.2)

## 2017-06-27 LAB — COMPREHENSIVE METABOLIC PANEL
ALBUMIN: 3.4 g/dL — AB (ref 3.5–5.0)
ALT: 31 U/L (ref 14–54)
ANION GAP: 11 (ref 5–15)
AST: 31 U/L (ref 15–41)
Alkaline Phosphatase: 177 U/L — ABNORMAL HIGH (ref 38–126)
BUN: 10 mg/dL (ref 6–20)
CO2: 24 mmol/L (ref 22–32)
CREATININE: 0.99 mg/dL (ref 0.44–1.00)
Calcium: 8.8 mg/dL — ABNORMAL LOW (ref 8.9–10.3)
Chloride: 101 mmol/L (ref 101–111)
GFR calc Af Amer: >60 mL/min (ref >60)
GFR calc non Af Amer: 60 mL/min — ABNORMAL LOW (ref >60)
Glucose, Bld: 107 mg/dL — ABNORMAL HIGH (ref 65–99)
Potassium: 3.3 mmol/L — ABNORMAL LOW (ref 3.5–5.1)
Sodium: 136 mmol/L (ref 135–145)
TOTAL PROTEIN: 7.8 g/dL (ref 6.5–8.1)
Total Bilirubin: 1.2 mg/dL (ref 0.3–1.2)

## 2017-06-27 LAB — RESPIRATORY PANEL BY PCR
ADENOVIRUS-RVPPCR: NOT DETECTED
BORDETELLA PERTUSSIS-RVPCR: NOT DETECTED
CORONAVIRUS NL63-RVPPCR: NOT DETECTED
Chlamydophila pneumoniae: NOT DETECTED
Coronavirus 229E: NOT DETECTED
Coronavirus HKU1: NOT DETECTED
Coronavirus OC43: NOT DETECTED
Influenza A: NOT DETECTED
Influenza B: NOT DETECTED
MYCOPLASMA PNEUMONIAE-RVPPCR: NOT DETECTED
Metapneumovirus: NOT DETECTED
Parainfluenza Virus 1: NOT DETECTED
Parainfluenza Virus 2: NOT DETECTED
Parainfluenza Virus 3: NOT DETECTED
Parainfluenza Virus 4: NOT DETECTED
Respiratory Syncytial Virus: NOT DETECTED
Rhinovirus / Enterovirus: NOT DETECTED

## 2017-06-27 LAB — URINALYSIS, ROUTINE W REFLEX MICROSCOPIC
Bilirubin Urine: NEGATIVE
GLUCOSE, UA: NEGATIVE mg/dL
Ketones, ur: NEGATIVE mg/dL
NITRITE: NEGATIVE
Protein, ur: NEGATIVE mg/dL
SPECIFIC GRAVITY, URINE: 1.009 (ref 1.005–1.030)
pH: 5 (ref 5.0–8.0)

## 2017-06-27 LAB — MAGNESIUM: Magnesium: 2.1 mg/dL (ref 1.7–2.4)

## 2017-06-27 LAB — CBC
HCT: 38.8 % (ref 36.0–46.0)
HEMOGLOBIN: 12.8 g/dL (ref 12.0–15.0)
MCH: 29.3 pg (ref 26.0–34.0)
MCHC: 33 g/dL (ref 30.0–36.0)
MCV: 88.8 fL (ref 78.0–100.0)
Platelets: 320 10*3/uL (ref 150–400)
RBC: 4.37 MIL/uL (ref 3.87–5.11)
RDW: 17 % — ABNORMAL HIGH (ref 11.5–15.5)
WBC: 18.6 10*3/uL — ABNORMAL HIGH (ref 4.0–10.5)

## 2017-06-27 LAB — I-STAT TROPONIN, ED: TROPONIN I, POC: 0.02 ng/mL (ref 0.00–0.08)

## 2017-06-27 LAB — INFLUENZA PANEL BY PCR (TYPE A & B)
INFLAPCR: NEGATIVE
INFLBPCR: NEGATIVE

## 2017-06-27 LAB — MRSA PCR SCREENING: MRSA by PCR: NEGATIVE

## 2017-06-27 LAB — BRAIN NATRIURETIC PEPTIDE: B Natriuretic Peptide: 516.7 pg/mL — ABNORMAL HIGH (ref 0.0–100.0)

## 2017-06-27 MED ORDER — FUROSEMIDE 10 MG/ML IJ SOLN
40.0000 mg | Freq: Once | INTRAMUSCULAR | Status: AC
  Administered 2017-06-27: 40 mg via INTRAVENOUS
  Filled 2017-06-27: qty 4

## 2017-06-27 MED ORDER — DILTIAZEM HCL 25 MG/5ML IV SOLN
10.0000 mg | Freq: Once | INTRAVENOUS | Status: AC
  Administered 2017-06-27: 10 mg via INTRAVENOUS
  Filled 2017-06-27: qty 5

## 2017-06-27 MED ORDER — ACETAMINOPHEN 500 MG PO TABS
1000.0000 mg | ORAL_TABLET | Freq: Once | ORAL | Status: AC
  Administered 2017-06-27: 1000 mg via ORAL
  Filled 2017-06-27: qty 2

## 2017-06-27 MED ORDER — ACETAMINOPHEN 650 MG RE SUPP: 650.0000 mg | Freq: Four times a day (QID) | RECTAL | Status: DC | PRN

## 2017-06-27 MED ORDER — NIFEDIPINE ER OSMOTIC RELEASE 30 MG PO TB24
30.0000 mg | ORAL_TABLET | Freq: Every day | ORAL | Status: DC
  Administered 2017-06-28: 30 mg via ORAL
  Filled 2017-06-27: qty 1

## 2017-06-27 MED ORDER — AMOXICILLIN-POT CLAVULANATE 875-125 MG PO TABS
1.0000 | ORAL_TABLET | Freq: Two times a day (BID) | ORAL | Status: DC
  Administered 2017-06-27 – 2017-06-28 (×2): 1 via ORAL
  Filled 2017-06-27 (×2): qty 1

## 2017-06-27 MED ORDER — PANTOPRAZOLE SODIUM 40 MG PO TBEC
40.0000 mg | DELAYED_RELEASE_TABLET | Freq: Every day | ORAL | Status: DC
  Filled 2017-06-27: qty 1

## 2017-06-27 MED ORDER — COLESTIPOL HCL 1 G PO TABS
1.0000 g | ORAL_TABLET | Freq: Two times a day (BID) | ORAL | Status: DC
  Filled 2017-06-27 (×2): qty 1

## 2017-06-27 MED ORDER — HYDROCODONE-ACETAMINOPHEN 5-325 MG PO TABS: 1.0000 | ORAL_TABLET | Freq: Four times a day (QID) | ORAL | Status: DC | PRN

## 2017-06-27 MED ORDER — WARFARIN - PHARMACIST DOSING INPATIENT
Freq: Every day | Status: DC
  Administered 2017-06-27: 19:00:00

## 2017-06-27 MED ORDER — OSELTAMIVIR PHOSPHATE 75 MG PO CAPS: 75.0000 mg | ORAL_CAPSULE | Freq: Every day | ORAL | Status: DC

## 2017-06-27 MED ORDER — FUROSEMIDE 80 MG PO TABS
80.0000 mg | ORAL_TABLET | Freq: Every day | ORAL | Status: DC
  Administered 2017-06-28: 80 mg via ORAL
  Filled 2017-06-27: qty 1

## 2017-06-27 MED ORDER — DILTIAZEM HCL 100 MG IV SOLR
5.0000 mg/h | INTRAVENOUS | Status: DC
  Administered 2017-06-27: 10 mg/h via INTRAVENOUS
  Filled 2017-06-27 (×2): qty 100

## 2017-06-27 MED ORDER — NICARDIPINE HCL IN NACL 20-0.86 MG/200ML-% IV SOLN: 3.0000 mg/h | INTRAVENOUS | Status: DC

## 2017-06-27 MED ORDER — PROGESTERONE MICRONIZED 100 MG PO CAPS
100.0000 mg | ORAL_CAPSULE | Freq: Every day | ORAL | Status: DC
  Administered 2017-06-27: 100 mg via ORAL
  Filled 2017-06-27: qty 1

## 2017-06-27 MED ORDER — METOPROLOL SUCCINATE ER 25 MG PO TB24
12.5000 mg | ORAL_TABLET | Freq: Every day | ORAL | Status: DC
Start: 2017-06-28 — End: 2017-06-28
  Administered 2017-06-28: 12.5 mg via ORAL
  Filled 2017-06-27: qty 1

## 2017-06-27 MED ORDER — IPRATROPIUM BROMIDE 0.06 % NA SOLN
2.0000 | Freq: Three times a day (TID) | NASAL | Status: DC | PRN
  Filled 2017-06-27: qty 15

## 2017-06-27 MED ORDER — POTASSIUM CHLORIDE CRYS ER 20 MEQ PO TBCR
40.0000 meq | EXTENDED_RELEASE_TABLET | Freq: Two times a day (BID) | ORAL | Status: DC
  Administered 2017-06-27 – 2017-06-28 (×2): 40 meq via ORAL
  Filled 2017-06-27 (×2): qty 2

## 2017-06-27 MED ORDER — POTASSIUM CHLORIDE CRYS ER 20 MEQ PO TBCR: 20.0000 meq | EXTENDED_RELEASE_TABLET | Freq: Every day | ORAL | Status: DC

## 2017-06-27 MED ORDER — METOPROLOL TARTRATE 5 MG/5ML IV SOLN: 5.0000 mg | Freq: Once | INTRAVENOUS | Status: DC

## 2017-06-27 MED ORDER — OSELTAMIVIR PHOSPHATE 75 MG PO CAPS
75.0000 mg | ORAL_CAPSULE | Freq: Two times a day (BID) | ORAL | Status: DC
  Administered 2017-06-28: 75 mg via ORAL
  Filled 2017-06-27: qty 1

## 2017-06-27 MED ORDER — POLYETHYL GLYCOL-PROPYL GLYCOL 0.4-0.3 % OP SOLN: Freq: Four times a day (QID) | OPHTHALMIC | Status: DC | PRN

## 2017-06-27 MED ORDER — DILTIAZEM HCL 25 MG/5ML IV SOLN
10.0000 mg | Freq: Once | INTRAVENOUS | Status: AC
  Administered 2017-06-27: 10 mg via INTRAVENOUS

## 2017-06-27 MED ORDER — ACETAMINOPHEN 325 MG PO TABS
650.0000 mg | ORAL_TABLET | Freq: Four times a day (QID) | ORAL | Status: DC | PRN
  Administered 2017-06-28: 650 mg via ORAL
  Filled 2017-06-27: qty 2

## 2017-06-27 MED ORDER — POTASSIUM CHLORIDE CRYS ER 20 MEQ PO TBCR
40.0000 meq | EXTENDED_RELEASE_TABLET | Freq: Once | ORAL | Status: AC
  Administered 2017-06-27: 40 meq via ORAL
  Filled 2017-06-27: qty 2

## 2017-06-27 MED ORDER — KETOROLAC TROMETHAMINE 15 MG/ML IJ SOLN
15.0000 mg | Freq: Once | INTRAMUSCULAR | Status: AC
  Administered 2017-06-27: 15 mg via INTRAVENOUS
  Filled 2017-06-27: qty 1

## 2017-06-27 MED ORDER — GABAPENTIN 300 MG PO CAPS
600.0000 mg | ORAL_CAPSULE | Freq: Four times a day (QID) | ORAL | Status: DC
  Administered 2017-06-27 – 2017-06-28 (×3): 600 mg via ORAL
  Filled 2017-06-27 (×3): qty 2

## 2017-06-27 MED ORDER — WARFARIN SODIUM 5 MG PO TABS
5.0000 mg | ORAL_TABLET | Freq: Once | ORAL | Status: AC
  Administered 2017-06-27: 5 mg via ORAL
  Filled 2017-06-27 (×2): qty 1

## 2017-06-27 MED ORDER — OSELTAMIVIR PHOSPHATE 75 MG PO CAPS: 75.0000 mg | ORAL_CAPSULE | Freq: Two times a day (BID) | ORAL | Status: DC

## 2017-06-27 NOTE — ED Notes (Signed)
Patient took home dose of metoprolol 12.5mg  po now

## 2017-06-27 NOTE — ED Provider Notes (Signed)
[facility] EMERGENCY DEPARTMENT Provider Note   CSN: [id number] Arrival date & time: 06/27/17  4098     History   Chief Complaint Chief Complaint  Patient presents with  . Shortness of Breath  . Influenza    HPI [name] is a 61 y.o. female.  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  Patient was just diagnosed with influenza several days ago.  She reports diffuse body aches and pains at that time.  Since then, she was started on Tamiflu empirically.  She has had persistent worsening of her shortness of breath.  She has severe dyspnea and was unable to sleep last night due to this.  She notes extreme shortness of breath with any kind of exertion.  Denies any orthopnea.  Denies any vomiting but has had nausea.  She has not wanted to eat or drink much.  Past Medical History:  Diagnosis Date  . Antiphospholipid antibody positive 10/10/2012  . Antiphospholipid antibody syndrome ([place])   . CREST syndrome ([place])   . CREST syndrome ([place]) 11/08/2012  . DVT (deep venous thrombosis) ([place])   . Hemorrhagic pericardial effusion 11/07/2012  . Hypertension   . ITP (idiopathic thrombocytopenic purpura)   . MVP (mitral valve prolapse)   . Paroxysmal atrial fibrillation (H[facility])   . Paroxysmal atrial fibrillation ([place]) 09/18/2011  . Pericardial effusion   . Raynaud's phenomenon   . 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 Thyroid goiter     Patient Active Problem List   Diagnosis Date Noted  . DOE (dyspnea on exertion) 08/21/2013  . Disturbance of skin sensation 05/29/2013  . Trigeminal nerve disorder, unspecified 05/29/2013  . Recurrent deep venous thrombosis, unspecified laterality ([place]) 12/05/2012  . Paroxysmal atrial fibrillation ([place]) 11/14/2012  . Thyroid goiter 11/14/2012  . Bilateral pulmonary infiltrates on CXR 11/11/2012  . Bilateral pleural effusion 11/11/2012  . CREST  syndrome ([place]) 11/08/2012  . 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 Hemorrhagic pericardial effusion 11/07/2012  . Fall 10/10/2012  . Antiphospholipid antibody positive 10/10/2012  . Chronic anticoagulation 10/10/2012  . Varicose veins of lower extremities with other complications 11/91/4782  . Post-phlebitic syndrome 09/02/2012  . MVP (mitral valve prolapse) 09/14/2011  . Pericardial effusion 09/14/2011  . HTN (hypertension) 09/14/2011  . Dyspnea 09/14/2011    Past Surgical History:  Procedure Laterality Date  . SPLENECTOMY     ITP  . SUBXYPHOID PERICARDIAL WINDOW N/A 11/08/2012   Procedure: SUBXYPHOID PERICARDIAL WINDOW;  Surgeon: Rexene Alberts, MD;  Location: [facility];  Service: Thoracic;  Laterality: N/A;  . TUBAL LIGATION    . Uterine ablation      OB History    No data available       Home Medications    Prior to Admission medications   Medication Sig Start Date End Date Taking? Authorizing Provider  ciprofloxacin (CIPRO) 500 MG tablet Take 500 mg by mouth 2 (two) times daily as needed (diarrhea).    [provider]  esomeprazole (NEXIUM) 40 MG capsule Take 40 mg by mouth 2 (two) times daily.    [provider]  furosemide (LASIX) 40 MG tablet Take 1 tablet (40 mg total) by mouth daily. NEED OV. 03/18/15   Minus Breeding, MD  gabapentin (NEURONTIN) 400 MG capsule Take 1 capsule (400 mg total) by mouth 3 (three) times daily. 08/04/14   Penumalli, Earlean Polka, MD  ipratropium (  ATROVENT) 0.06 % nasal spray Place 2 sprays into the nose 4 (four) times daily as needed for rhinitis.    [provider]  loteprednol (ALREX) 0.2 % SUSP Place 1 drop into both eyes as needed.     [provider]  metoprolol succinate (TOPROL XL) 25 MG 24 hr tablet Take 1 tablet (25 mg total) by mouth daily. 06/12/14   Minus Breeding, MD  mometasone (NASONEX) 50 MCG/ACT nasal spray Place 2 sprays into the nose daily as needed.     [provider]    Polyethyl Glycol-Propyl Glycol (SYSTANE ULTRA OP) Apply 1 drop to eye 4 (four) times daily as needed (dry eyes).    [provider]  potassium chloride SA (K-DUR,KLOR-CON) 20 MEQ tablet TAKE 1 TABLET BY MOUTH DAILY 03/16/15   Minus Breeding, MD  progesterone (PROMETRIUM) 100 MG capsule Take 100 mg by mouth at bedtime.     [provider]  rifaximin (XIFAXAN) 550 MG TABS Take 550 mg by mouth daily.    [provider]  Risedronate Sodium (ACTONEL PO) Take 1 tablet by mouth every 30 (thirty) days.    [provider]  warfarin (COUMADIN) 7.5 MG tablet Take 7.5 mg by mouth daily. everyday except for 5 mgm on T/TH    [provider]    Family History Family History  Problem Relation Age of Onset  . Cancer Mother        Lung  . Diabetes Father   . COPD Father   . Hyperlipidemia Father   . Hypertension Father     Social History Social History   Tobacco Use  . Smoking status: Never Smoker  . Smokeless tobacco: Never Used  Substance Use Topics  . Alcohol use: Yes    Alcohol/week: 0.6 oz    Types: 1 Glasses of wine per week    Comment: weekends  . Drug use: No     Allergies   Demerol; Codeine; and Ultram [tramadol hcl]   Review of Systems Review of Systems  Constitutional: Positive for chills, fatigue and fever.  Respiratory: Positive for cough and shortness of breath.   Gastrointestinal: Positive for nausea.  Neurological: Positive for weakness.  All other systems reviewed and are negative.    Physical Exam Updated Vital Signs BP 121/71 (BP Location: Right Arm)   Pulse 86   Temp 100 F (37.8 C) (Oral)   Resp 18   SpO2 94%   Physical Exam  Constitutional: She is oriented to person, place, and time. She appears well-developed and well-nourished. She appears ill. No distress.  HENT:  Head: Normocephalic and atraumatic.  Dry mucous membranes  Eyes: Conjunctivae are normal.  Neck: Neck supple.  Cardiovascular: Normal rate,  regular rhythm and normal heart sounds. Exam reveals no friction rub.  No murmur heard. Pulmonary/Chest: Effort normal. Tachypnea noted. No respiratory distress. She has decreased breath sounds. She has no wheezes. She has rales (Bibasilar, worse on left greater than right).  Abdominal: Soft. She exhibits no distension.  Musculoskeletal: She exhibits no edema.  Neurological: She is alert and oriented to person, place, and time. She exhibits normal muscle tone.  Skin: Skin is warm. Capillary refill takes less than 2 seconds.  Psychiatric: She has a normal mood and affect.  Nursing note and vitals reviewed.    ED Treatments / Results  Labs (all labs ordered are listed, but only abnormal results are displayed) Labs Reviewed  CBC - Abnormal; Notable for the following components:  Result Value   WBC 18.6 (*)    RDW 17.0 (*)    All other components within normal limits  BRAIN NATRIURETIC PEPTIDE - Abnormal; Notable for the following components:   B Natriuretic Peptide 516.7 (*)    All other components within normal limits  COMPREHENSIVE METABOLIC PANEL - Abnormal; Notable for the following components:   Potassium 3.3 (*)    Glucose, Bld 107 (*)    Calcium 8.8 (*)    Albumin 3.4 (*)    Alkaline Phosphatase 177 (*)    GFR calc non Af Amer 60 (*)    All other components within normal limits  PROTIME-INR - Abnormal; Notable for the following components:   Prothrombin Time 28.7 (*)    All other components within normal limits  RESPIRATORY PANEL BY PCR  CULTURE, BLOOD (ROUTINE X 2)  CULTURE, BLOOD (ROUTINE X 2)  I-STAT TROPONIN, ED    EKG  EKG Interpretation  Date/Time:  Wednesday June 27 2017 08:35:41 EST Ventricular Rate:  96 PR Interval:    QRS Duration: 64 QT Interval:  450 QTC Calculation: 568 R Axis:   13 Text Interpretation:   Critical Test Result: Long QTc Sinus rhythm with short PR Cannot rule out Anterior infarct , age undetermined Abnormal ECG Since last EKG,  rate has decreased QT is more prolonged Otherwise no significant change Confirmed by Duffy Bruce [phone]) on 06/27/2017 9:11:11 AM       Radiology Dg Chest 2 View  Result Date: 06/27/2017 CLINICAL DATA:  Shortness of breath. EXAM: CHEST  2 VIEW COMPARISON:  Radiographs of October 06, 2013. FINDINGS: Stable cardiomediastinal silhouette. Stable mild central pulmonary vascular congestion is noted. No pneumothorax is noted. Minimal bilateral pleural effusions are noted. Mildly increased interstitial densities are noted concerning for pulmonary edema. Mild left basilar subsegmental atelectasis is noted. Bony thorax is unremarkable. IMPRESSION: Central pulmonary vascular congestion is noted with probable bilateral pulmonary edema. Minimal bilateral pleural effusions are noted. Left basilar subsegmental atelectasis is noted. Electronically Signed   By: Marijo Conception, M.D.   On: 06/27/2017 09:19    Procedures Procedures (including critical care time)  Medications Ordered in ED Medications  furosemide (LASIX) injection 40 mg (not administered)  potassium chloride SA (K-DUR,KLOR-CON) CR tablet 40 mEq (not administered)  acetaminophen (TYLENOL) tablet 1,000 mg (1,000 mg Oral Given 06/27/17 1030)  ketorolac (TORADOL) 15 MG/ML injection 15 mg (15 mg Intravenous Given 06/27/17 1032)     Initial Impression / Assessment and Plan / ED Course  I have reviewed the triage vital signs and the nursing notes.  Pertinent labs & imaging results that were available during my care of the patient were reviewed by me and considered in my medical decision making (see chart for details).  Clinical Course as of Jun 27 1154  Wed Jun 27, 2017  0921 DG Chest 2 View [CI]    Clinical Course User Index [CI] Duffy Bruce, MD    61 yo F with PMhx CREST syndrome, pAFib, DVT/PE 2/2 h/o antiphospholipid syndrome on coumadin here with SOB.  On exam, patient has significant tachypnea, borderline hypoxia, and is mildly  distressed.  Lab work and imaging shows possible pulmonary edema versus atypical pneumonia.  Her BNP is elevated and she does have history of diastolic dysfunction and pulmonary hypertension.  Suspect decompensated diastolic CHF, likely secondary to recent viral illness, versus ongoing influenza or other viral pneumonia.  Given her persistent work of breathing and tachypnea, will admit for diuresis and further  monitoring.  RVP has been sent.  This note was prepared with assistance of Systems analyst. Occasional wrong-word or sound-a-like substitutions may have occurred due to the inherent limitations of voice recognition software.   Final Clinical Impressions(s) / ED Diagnoses   Final diagnoses:  SOB (shortness of breath)  Viral syndrome  Acute on chronic diastolic heart failure [facility])    ED Discharge Orders    None       Duffy Bruce, MD 06/27/17 1155

## 2017-06-27 NOTE — ED Notes (Signed)
Patient ambulated to the bathroom with assist. Upon returning to room she was in atrial fib RVR with a rate of 135-160. Patient helped back into bed, ekg obtained, and edp notified and admitting md paged. Patient feels short of breath but is in no acute distress. o2 turned up to 4lnc.

## 2017-06-27 NOTE — Progress Notes (Signed)
[place] for warfarin Indication: AFib and hx of DVT  Allergies  Allergen Reactions  . Demerol   . Codeine Nausea And Vomiting  . Ultram [Tramadol Hcl] Other (See Comments)    Makes her hands shake.    Patient Measurements:   Vital Signs: Temp: 100 F (37.8 C) (11/21 0841) Temp Source: Oral (11/21 0841) BP: 116/83 (11/21 1309) Pulse Rate: 136 (11/21 1309)  Labs: Recent Labs    06/27/17 0900 06/27/17 0909 06/27/17 1015  HGB 12.8  --   --   HCT 38.8  --   --   PLT 320  --   --   LABPROT  --   --  28.7*  INR  --   --  2.73  CREATININE  --  0.99  --     CrCl cannot be calculated (Unknown ideal weight.).   Medical History: Past Medical History:  Diagnosis Date  . Antiphospholipid antibody positive 10/10/2012  . Antiphospholipid antibody syndrome ([place])   . CREST syndrome ([place])   . CREST syndrome ([place]) 11/08/2012  . DVT (deep venous thrombosis) ([place])   . Hemorrhagic pericardial effusion 11/07/2012  . Hypertension   . ITP (idiopathic thrombocytopenic purpura)   . MVP (mitral valve prolapse)   . Paroxysmal atrial fibrillation (H[facility])   . Paroxysmal atrial fibrillation ([place]) 09/18/2011  . Pericardial effusion   . Raynaud's phenomenon   . 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 Thyroid goiter     Assessment: 61 yo feamle admitted with SOB. Recently diagnosed with influenza and was started on Tamiflu. She has AFib, antiphospholipid antibody and a history of recurrent DVTs. INR 2.73.  She is on warfarin PTA at a dose of: 5 mg on Wed/Sat/Sun and 7.5 mg on all other days  Goal of Therapy:  INR 2-3 Monitor platelets by anticoagulation protocol: Yes    Plan:  -Warfarin 5 mg po x1 -Daily INR   Ophelia Sipe, Jake Church 06/27/2017,1:19 PM

## 2017-06-27 NOTE — ED Notes (Signed)
Patient states that over the past couple of days she has been having increasing shortness of breath that has gotten worse with minimal movement. She states that the sob is worse when she lies flat. Breath sounds are diminished at the bases. She states that she was recently seen by pcp for r/o flu. Flu swab was negative but due to s/s patient was placed on tami-flu prophylaxis.  She states that she has been running low grade fevers at home and has been taking tylenol as needed.

## 2017-06-27 NOTE — ED Notes (Signed)
Regular diet lunch tray ordered @ 1221.

## 2017-06-27 NOTE — ED Notes (Signed)
Patient remains tachy with a fib rvr rate of 133, edp ordered for patient to take home dose of metoprolol po. Patient took home dose.

## 2017-06-27 NOTE — H&P (Signed)
Date: 06/27/2017               Patient Name:  [name] MRN: [id number]  DOB: April 19, 1956 Age / Sex: 61 y.o., female   PCP: Jani Gravel, MD         Medical Service: Internal Medicine Teaching Service         Attending Physician: Dr. Oval Linsey, MD    First Contact: Dr. Isac Sarna Pager: [phone]  Second Contact: Dr. Philipp Ovens Pager: [phone]       After Hours (After 5p/  First Contact Pager: [phone]  weekends / holidays): Second Contact Pager: [phone]   Chief Complaint: Shortness of breath   History of Present Illness:  [name] is a 61 y.o. female with history of CREST syndrome, pHTN, HFpEF EF 55% in 2014, paroxysmal atrial fibrillation, hx of DVTs secondary to antiphospholipid syndrome on warfarin who presents to the ED with worsening shortness of breath.  She was recently diagnosed with influenza by PCP and was started on Tamiflu on 11/19. Rapid flu test negative. At home she has been experiencing fever, chills, myalgia, and dyspnea on exertion that has been progressively worsening. She also reports nausea, but no vomiting and decreased appetite x 3 days.  She has a history of hemorrhagic effusion secondary to trauma early this year and was concerned she might have a recurrent effusion. Denies recent trauma. Denies chest pain, cough, abdominal pain, changes in bowel movements, urinary symptoms, and lower extremity swelling.  ED course: On admission T 100, HR 97 BP 138/60, RR 24 98% on room air. Per EMS 89-90% on RA, though no documentation of hypoxia on chart. She was placed on 2L on arrival to the ED.  Lab work remarkable for leukocytosis of 18.6, K 3.3, BNP 516.7 (no prior ones for comparison).  Her chest x-ray showed vascular congestion and small bilateral pleural effusions.  EKG showed atrial fibrillation and no signs of acute ischemia.  Patient stable when seen and on 2 L of supplemental oxygen.  She then went into atrial fibrillation with rapid ventricular  response with HR 140-150s. She received 2 doses of IV diltiazem as well as her home metoprolol without conversion and was started on a diltiazem drip.  Review of Systems: A complete ROS was negative except as per HPI.   Meds:  Current Meds  Medication Sig  . acetaminophen (TYLENOL) 500 MG tablet Take 1,000 mg by mouth every 6 (six) hours as needed for mild pain, fever or headache.  . albuterol (PROVENTIL HFA;VENTOLIN HFA) 108 (90 Base) MCG/ACT inhaler Inhale 1-2 puffs into the lungs every 6 (six) hours as needed.  . Cholecalciferol (VITAMIN D3) 50000 units CAPS Take 50,000 capsules by mouth once a week.   . ciprofloxacin (CIPRO) 500 MG tablet Take 500 mg by mouth 2 (two) times daily as needed (diarrhea).  . cycloSPORINE (RESTASIS) 0.05 % ophthalmic emulsion Place 1 drop into both eyes daily.  Marland Kitchen esomeprazole (NEXIUM) 40 MG capsule Take 40 mg by mouth 2 (two) times daily.  . furosemide (LASIX) 40 MG tablet Take 1 tablet (40 mg total) by mouth daily. NEED OV. (Patient taking differently: Take 80 mg by mouth daily. NEED OV.)  . gabapentin (NEURONTIN) 400 MG capsule Take 1 capsule (400 mg total) by mouth 3 (three) times daily.  Marland Kitchen ipratropium (ATROVENT) 0.06 % nasal spray Place 2 sprays into the nose 4 (four) times daily as needed for rhinitis.  . metoprolol succinate (TOPROL XL) 25 MG 24  hr tablet Take 1 tablet (25 mg total) by mouth daily. (Patient taking differently: Take 12.5 mg by mouth daily. )  . NIFEdipine (PROCARDIA-XL/ADALAT-CC/NIFEDICAL-XL) 30 MG 24 hr tablet Take 30 mg by mouth daily.  . potassium chloride SA (K-DUR,KLOR-CON) 20 MEQ tablet TAKE 1 TABLET BY MOUTH DAILY (Patient taking differently: TAKE 1 TABLET 10 MEQ  BY MOUTH DAILY)  . progesterone (PROMETRIUM) 100 MG capsule Take 100 mg by mouth at bedtime.   . Risedronate Sodium (ACTONEL PO) Take 1 tablet by mouth every 30 (thirty) days.  Marland Kitchen warfarin (COUMADIN) 7.5 MG tablet Take 7.5 mg by mouth daily. everyday except for 5 mgm on wed,  Saturday, Sunday.     Allergies: Allergies as of 06/27/2017 - Review Complete 06/27/2017  Allergen Reaction Noted  . Demerol  09/14/2011  . Codeine Nausea And Vomiting 09/14/2011  . Ultram [tramadol hcl] Other (See Comments) 09/14/2011   Past Medical History:  Diagnosis Date  . Antiphospholipid antibody positive 10/10/2012  . Antiphospholipid antibody syndrome ([place])   . CREST syndrome ([place])   . CREST syndrome ([place]) 11/08/2012  . DVT (deep venous thrombosis) ([place])   . Hemorrhagic pericardial effusion 11/07/2012  . Hypertension   . ITP (idiopathic thrombocytopenic purpura)   . MVP (mitral valve prolapse)   . Paroxysmal atrial fibrillation (H[facility])   . Paroxysmal atrial fibrillation ([place]) 09/18/2011  . Pericardial effusion   . Raynaud's phenomenon   . 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 Thyroid goiter     Family History:  Family History  Problem Relation Age of Onset  . Cancer Mother        Lung  . Diabetes Father   . COPD Father   . Hyperlipidemia Father   . Hypertension Father     Social History:  Social History   Tobacco Use  . Smoking status: Never Smoker  . Smokeless tobacco: Never Used  Substance Use Topics  . Alcohol use: Yes    Alcohol/week: 0.6 oz    Types: 1 Glasses of wine per week    Comment: weekends  . Drug use: No     Physical Exam: Blood pressure 116/83, pulse (!) 136, temperature 100 F (37.8 C), temperature source Oral, resp. rate 20, SpO2 95 %.  General: Pleasant female, well-nourished, well-developed, sitting up in bed in no acute distress Eyes: anicteric sclera, PERRL Cardiac: regular rate and rhythm, nl S1/S2, no murmurs, rubs or gallops, no JVD  Pulm: CTAB, no wheezes or crackles, no increased work of breathing  Abd: soft, NTND, bowel sounds present  Neuro: A&Ox3, able to move all 4 extremities with no focal deficits noted Ext: warm and well perfused, no peripheral edema, 2+ DP pulses bilaterally     EKG: personally  reviewed my interpretation is NSR with HR 96 bpm, short PR interval, no axis deviation, no signs of acute ischemia, no Q waves   CXR: personally reviewed my interpretation is mild tracheal deviation to the right, increased pulmonary vasculature markings, small  bilateral pleural effusions, no opacities or consolidations concerning for pneumonia  Assessment & Plan by Problem:  [name] is a 61 y.o. female with history of CREST syndrome, pHTN, HFpEF EF 55% in 2014, paroxysmal atrial fibrillation, hx of DVTs secondary to antiphospholipid syndrome on warfarin who presents to the ED with worsening shortness of breath.  # Shortness of breath: Patient presents with fevers, chills, myalgias, and nausea for 3 days. She was diagnosed with influenza by PCP on 11/19 and prescribed Tamiflu,  but rapid flu test negative at the time (results in care everywhere).  On exam her cardiac and pulmonary exam are benign.  It is very likely that her dyspnea is secondary to atrial fibrillation vs viral URI. She does however have a leukocytosis of 18.6 and does not seem on medications that could cause this. Chest x-ray without evidence of pneumonia. She does have small bilateral pleural effusions, but unclear if these are new or chronic. Will start Augmentin given history of asplenia. Will also continue tamiflu and order Flu PCR as flu swab can be falsely negative in early disease course.  - Continue Tamiflu  - Start Augmentin x 5 days  - F/u influenza PCR and RVP  - Continue home Atrovent  - CBC and BMP in AM   # Afib with RVR: Patient has history of Afib and is on warfarin. She was on NSR on arrival to the ED and went into Afib with RVR while in the ED with HR 140-150s.  S/p 2 IV doses of diltiazem and started on diltiazem gtt.  - Continue diltiazem gtt   # QTc prolongation: Initial ED EKG with QTc 568 ms. Difficult to manually calculate as T waves barely visible. She is on ciprofloxacin 500 mg BID chronically for  history of SIBO.  - Hold home ciprofloxacin   # CREST: complicated by pulmonary HTN and Raynaud's syndrome  - Continue home Protonix 40 mg QD  - Continue home gabapentin 600 mg QID  - Resume home Procardia 30 mg 11/22  # HFpEF: EF 55% 2014. Appears euvolemic on exam.  - Continue home metoprolol 12.5mg  QD + Lasix 80 mg QD   # Hx of DVT: secondary to antiphospholipid syndrome. On warfarin 7.5 mg M/T/Th/F and 5 mg Sun/W/Sat. INR therapeutic.  - Continue home warfarin   # Small bowel bacterial overgrowth: Started on ciprofloxacin PRN, but takes it BID.  - Holding home ciprofloxacin 500 mg BID QD   # Hot flashes:  - Continue home progesterone 100 mg QD   F: none E: will continue to monitor and replete as needed  N: HH diet   VTE ppx: Home warfarin as above   Code status: Full code, not confirmed on admission   Dispo: Admit patient to Observation with expected length of stay less than 2 midnights.  SignedWelford Roche, MD 06/27/2017, 3:09 PM  Pager: [phone]

## 2017-06-27 NOTE — ED Triage Notes (Signed)
Pt states she is being treated by her PCP for shortness of breath. She states she has been taking tamiflu. Pt here for worsening shortness of breath and some chest pain. Pt AOx4. Dyspnea with exertion noted.

## 2017-06-28 DIAGNOSIS — R0602 Shortness of breath: Secondary | ICD-10-CM

## 2017-06-28 DIAGNOSIS — B349 Viral infection, unspecified: Secondary | ICD-10-CM | POA: Diagnosis not present

## 2017-06-28 DIAGNOSIS — I4891 Unspecified atrial fibrillation: Secondary | ICD-10-CM | POA: Diagnosis not present

## 2017-06-28 DIAGNOSIS — I5033 Acute on chronic diastolic (congestive) heart failure: Secondary | ICD-10-CM | POA: Diagnosis not present

## 2017-06-28 DIAGNOSIS — I110 Hypertensive heart disease with heart failure: Secondary | ICD-10-CM | POA: Diagnosis not present

## 2017-06-28 DIAGNOSIS — J9601 Acute respiratory failure with hypoxia: Secondary | ICD-10-CM | POA: Diagnosis not present

## 2017-06-28 DIAGNOSIS — Z79899 Other long term (current) drug therapy: Secondary | ICD-10-CM | POA: Diagnosis not present

## 2017-06-28 LAB — CBC
HCT: 34.6 % — ABNORMAL LOW (ref 36.0–46.0)
Hemoglobin: 11.4 g/dL — ABNORMAL LOW (ref 12.0–15.0)
MCH: 28.8 pg (ref 26.0–34.0)
MCHC: 32.9 g/dL (ref 30.0–36.0)
MCV: 87.4 fL (ref 78.0–100.0)
PLATELETS: 339 10*3/uL (ref 150–400)
RBC: 3.96 MIL/uL (ref 3.87–5.11)
RDW: 16.8 % — AB (ref 11.5–15.5)
WBC: 12.3 10*3/uL — ABNORMAL HIGH (ref 4.0–10.5)

## 2017-06-28 LAB — BASIC METABOLIC PANEL
Anion gap: 6 (ref 5–15)
BUN: 11 mg/dL (ref 6–20)
CALCIUM: 7.9 mg/dL — AB (ref 8.9–10.3)
CO2: 24 mmol/L (ref 22–32)
CREATININE: 0.8 mg/dL (ref 0.44–1.00)
Chloride: 107 mmol/L (ref 101–111)
GFR calc non Af Amer: >60 mL/min (ref >60)
Glucose, Bld: 114 mg/dL — ABNORMAL HIGH (ref 65–99)
Potassium: 3.7 mmol/L (ref 3.5–5.1)
Sodium: 137 mmol/L (ref 135–145)

## 2017-06-28 LAB — PROTIME-INR
INR: 3.42
PROTHROMBIN TIME: 34.2 s — AB (ref 11.4–15.2)

## 2017-06-28 MED ORDER — WARFARIN SODIUM 5 MG PO TABS: 5.0000 mg | ORAL_TABLET | Freq: Every day | ORAL | 0 refills | Status: DC

## 2017-06-28 MED ORDER — OSELTAMIVIR PHOSPHATE 75 MG PO CAPS: 75.0000 mg | ORAL_CAPSULE | Freq: Two times a day (BID) | ORAL | 0 refills | Status: DC

## 2017-06-28 MED ORDER — NIFEDIPINE ER OSMOTIC RELEASE 30 MG PO TB24
30.0000 mg | ORAL_TABLET | Freq: Every day | ORAL | Status: DC
  Filled 2017-06-28: qty 1

## 2017-06-28 MED ORDER — AMOXICILLIN-POT CLAVULANATE 875-125 MG PO TABS: 1.0000 | ORAL_TABLET | Freq: Two times a day (BID) | ORAL | 0 refills | Status: DC

## 2017-06-28 MED ORDER — WARFARIN SODIUM 2.5 MG PO TABS: 2.5000 mg | ORAL_TABLET | Freq: Every day | ORAL | 0 refills | Status: DC

## 2017-06-28 MED ORDER — METOPROLOL SUCCINATE ER 25 MG PO TB24: 12.5000 mg | ORAL_TABLET | Freq: Every day | ORAL | 0 refills | Status: AC

## 2017-06-28 NOTE — Progress Notes (Signed)
[place] for warfarin Indication: AFib and hx of DVT  Allergies  Allergen Reactions  . Demerol   . Codeine Nausea And Vomiting  . Ultram [Tramadol Hcl] Other (See Comments)    Makes her hands shake.    Patient Measurements:   Vital Signs: Temp: 97.5 F (36.4 C) (11/22 0851) Temp Source: Oral (11/22 0851) BP: 100/56 (11/22 0851) Pulse Rate: 75 (11/22 0851)  Labs: Recent Labs    06/27/17 0900 06/27/17 0909 06/27/17 1015 06/28/17 0338  HGB 12.8  --   --  11.4*  HCT 38.8  --   --  34.6*  PLT 320  --   --  339  LABPROT  --   --  28.7* 34.2*  INR  --   --  2.73 3.42  CREATININE  --  0.99  --  0.80    Estimated Creatinine Clearance: 78.7 mL/min (by C-G formula based on SCr of 0.8 mg/dL).   Medical History: Past Medical History:  Diagnosis Date  . Antiphospholipid antibody syndrome ([place])   . Arthritis    "all over; related to CREST" (06/27/2017)  . Chronic bronchitis ([place])    "constantly in the winter" (06/27/2017)  . 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 DVT (deep venous thrombosis) ([place]) 1992; 1995   RLE; 1995  . GERD (gastroesophageal reflux disease)   . Heart murmur   . Hemorrhagic pericardial effusion 11/07/2012  . Hypertension   . ITP (idiopathic thrombocytopenic purpura)   . MVP (mitral valve prolapse)   . Paroxysmal atrial fibrillation ([place]) 09/18/2011  . Pneumonia    "constantly in the winter" (06/27/2017)  . Raynaud's phenomenon   . 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 Thyroid goiter     Assessment: 61 yo feamle admitted with SOB. Recently diagnosed with influenza and was started on Tamiflu. She has AFib, antiphospholipid antibody and a history of recurrent DVTs.   INR went from therapeutic 2.73 to supratherapeutic 3.42 after 1 dose of 5 mg. Unsure of what home dose she took prior.  She is on warfarin PTA at a dose of: 5 mg on Wed/Sat/Sun  and 7.5 mg on all other days  Goal of Therapy:  INR 2-3 Monitor platelets by anticoagulation protocol: Yes    Plan:  -Hold warfarin tonight x 1 -Daily INR   Jennice Renegar A Jhayden Demuro 06/28/2017,11:02 AM

## 2017-06-28 NOTE — Discharge Summary (Addendum)
Physician Discharge Summary  [name] DPO:[id number] DOB: Oct 08, 1955 DOA: 06/27/2017  PCP: Chapman Moss, MD  Admit date: 06/27/2017 Discharge date: 06/28/2017  Admitted From: Home  Disposition:  Home   Recommendations for Outpatient Follow-up:  1. Follow up with PCP in 1-2 weeks 2. Please obtain BMP/CBC in one week 3. Needs INR check. Also needs EKG prior resumption of chronic ciprofloxacin use.  4. Follow up on resolution of Pneumonitis.  5. May resume work after 11-27 if stable.     Discharge Condition: stable.  CODE STATUS: full code.  Diet recommendation: Heart Healthy   Brief/Interim Summary: 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 At baseline she has dyspnea on exertion but is able to ambulate a few hundred feet before having to rest. Over the last 3 days she has been exceedingly dyspneic with minimal exertion. She was seen by her primary care provider who did a flu test which was negative, but empirically treated with Tamiflu. She has noted some improvement in her myalgias, also helped by the Toradol given in the emergency department, but with the persistent significant dyspnea she presented to the emergency department for further assessment. A chest x-ray was notable for chronic left basilar scarring that was seen back in 2015 and some mild interstitial prominence in the right base with a Kerley B line that is new. While in the emergency department she developed the sudden onset of palpitations and was found to be in atrial fibrillation with a rapid ventricular rate. She was therefore admitted to the internal medicine teaching service  for further care.     Assessment & Plan:   Active Problems:   Respiratory failure with hypoxia (HCC)   Atrial fibrillation with rapid ventricular response (HCC)   Presume viral Pneumonitis;  improved. Oxygen sat 98 RA. Afebrile.  She was started on Augmentin due to her history of asplenia. Plan to continue with antibiotics for 5 more days.  Stable for discharge.  WBC decreased from 18 to 12.  Blood culture no growth to date.  Respiratory panel negative.   A fib; she has history off. Suspect exacerbation related to fevers. She was transiently on Cardizem gtt, HR now in the 80. continue home dose metoprolol and nicardipine.   Prolong QT; mg normal. Was 568 decreased to 422. Ciprofloxacin discontinue.   CREST: complicated by pulmonary HTN and Raynaud's syndrome continue with chronic medications.   Hx of DVT: secondary to antiphospholipid syndrome. On warfarin . Plan to resume coumadin 11-24. INR increase from 2.7 to 3.5. She will resume 2.5 mg coumadin.   Small bowel bacterial overgrowth: on  ciprofloxacin PRN, but takes it BID. Needs to further discussed with PCP. Also need EKG if ciprofloxacin is resume.       Discharge Diagnoses:  Active Problems:   Respiratory failure with hypoxia [facility])   Atrial fibrillation with rapid ventricular response [facility])    Discharge Instructions  Discharge Instructions    Diet - low sodium heart healthy   Complete by:  As directed    Increase activity slowly   Complete by:  As directed      Allergies as of 06/28/2017      Reactions   Demerol    Codeine  Nausea And Vomiting   Ultram [tramadol Hcl] Other (See Comments)   Makes her hands shake.      Medication List    STOP taking these medications   ciprofloxacin 500 MG tablet Commonly known as:  CIPRO     TAKE these medications   acetaminophen 500 MG tablet Commonly known as:  TYLENOL Take 1,000 mg by mouth every 6 (six) hours as needed for mild pain, fever or  headache.   ACTONEL PO Take 1 tablet by mouth every 30 (thirty) days.   albuterol 108 (90 Base) MCG/ACT inhaler Commonly known as:  PROVENTIL HFA;VENTOLIN HFA Inhale 1-2 puffs into the lungs every 6 (six) hours as needed.   amoxicillin-clavulanate 875-125 MG tablet Commonly known as:  AUGMENTIN Take 1 tablet by mouth every 12 (twelve) hours.   esomeprazole 40 MG capsule Commonly known as:  NEXIUM Take 40 mg by mouth 2 (two) times daily.   furosemide 40 MG tablet Commonly known as:  LASIX Take 1 tablet (40 mg total) by mouth daily. NEED OV. What changed:    how much to take  additional instructions   gabapentin 400 MG capsule Commonly known as:  NEURONTIN Take 1 capsule (400 mg total) by mouth 3 (three) times daily.   ipratropium 0.06 % nasal spray Commonly known as:  ATROVENT Place 2 sprays into the nose 4 (four) times daily as needed for rhinitis.   metoprolol succinate 25 MG 24 hr tablet Commonly known as:  TOPROL XL Take 0.5 tablets (12.5 mg total) by mouth daily.   mometasone 50 MCG/ACT nasal spray Commonly known as:  NASONEX Place 2 sprays into the nose daily as needed.   NIFEdipine 30 MG 24 hr tablet Commonly known as:  PROCARDIA-XL/ADALAT-CC/NIFEDICAL-XL Take 30 mg by mouth daily.   oseltamivir 75 MG capsule Commonly known as:  TAMIFLU Take 1 capsule (75 mg total) by mouth 2 (two) times daily.   potassium chloride SA 20 MEQ tablet Commonly known as:  K-DUR,KLOR-CON TAKE 1 TABLET BY MOUTH DAILY What changed:    how much to take  how to take this  when to take this   progesterone 100 MG capsule Commonly known as:  PROMETRIUM Take 100 mg by mouth at bedtime.   RESTASIS 0.05 % ophthalmic emulsion Generic drug:  cycloSPORINE Place 1 drop into both eyes daily.   Vitamin D3 50000 units Caps Take 50,000 capsules by mouth once a week.   warfarin 2.5 MG tablet Commonly known as:  COUMADIN Take as directed. If you are unsure how to take this  medication, talk to your nurse or doctor. Original instructions:  Take 1 tablet (2.5 mg total) by mouth daily. Start taking on:  06/29/2017 What changed:    medication strength  how much to take  additional instructions       Allergies  Allergen Reactions  . Demerol   . Codeine Nausea And Vomiting  . Ultram [Tramadol Hcl] Other (See Comments)    Makes her hands shake.    Consultations:  none   Procedures/Studies: Dg Chest 2 View  Result Date: 06/27/2017 CLINICAL DATA:  Shortness of breath. EXAM: CHEST  2 VIEW COMPARISON:  Radiographs of October 06, 2013. FINDINGS: Stable cardiomediastinal silhouette. Stable mild central pulmonary vascular congestion is noted. No pneumothorax is noted. Minimal bilateral pleural effusions are noted. Mildly increased interstitial densities are noted concerning for pulmonary edema. Mild left basilar subsegmental atelectasis is noted. Bony thorax is unremarkable. IMPRESSION: Central pulmonary vascular congestion is noted with  probable bilateral pulmonary edema. Minimal bilateral pleural effusions are noted. Left basilar subsegmental atelectasis is noted. Electronically Signed   By: Marijo Conception, M.D.   On: 06/27/2017 09:19       Subjective: She is feeling much better. Dyspnea improved.  Cough improved.    Discharge Exam: Vitals:   06/28/17 0851 06/28/17 1154  BP: (!) 100/56 (!) 103/42  Pulse: 75   Resp: (!) 75   Temp: (!) 97.5 F (36.4 C) 98.1 F (36.7 C)  SpO2: 94%    Vitals:   06/28/17 0350 06/28/17 0500 06/28/17 0851 06/28/17 1154  BP: 106/62  (!) 100/56 (!) 103/42  Pulse:   75   Resp:   (!) 75   Temp: 99.3 F (37.4 C)  (!) 97.5 F (36.4 C) 98.1 F (36.7 C)  TempSrc: Oral  Oral Oral  SpO2: 98%  94%   Weight:  82 kg (180 lb 11.2 oz)    Height:        General: Pt is alert, awake, not in acute distress Cardiovascular: RRR, S1/S2 +, no rubs, no gallops Respiratory: CTA bilaterally, no wheezing, no rhonchi Abdominal:  Soft, NT, ND, bowel sounds + Extremities: no edema, no cyanosis    The results of significant diagnostics from this hospitalization (including imaging, microbiology, ancillary and laboratory) are listed below for reference.     Microbiology: Recent Results (from the past 240 hour(s))  Respiratory Panel by PCR     Status: None   Collection Time: 06/27/17  4:43 PM  Result Value Ref Range Status   Adenovirus NOT DETECTED NOT DETECTED Final   Coronavirus 229E NOT DETECTED NOT DETECTED Final   Coronavirus HKU1 NOT DETECTED NOT DETECTED Final   Coronavirus NL63 NOT DETECTED NOT DETECTED Final   Coronavirus OC43 NOT DETECTED NOT DETECTED Final   Metapneumovirus NOT DETECTED NOT DETECTED Final   Rhinovirus / Enterovirus NOT DETECTED NOT DETECTED Final   Influenza A NOT DETECTED NOT DETECTED Final   Influenza B NOT DETECTED NOT DETECTED Final   Parainfluenza Virus 1 NOT DETECTED NOT DETECTED Final   Parainfluenza Virus 2 NOT DETECTED NOT DETECTED Final   Parainfluenza Virus 3 NOT DETECTED NOT DETECTED Final   Parainfluenza Virus 4 NOT DETECTED NOT DETECTED Final   Respiratory Syncytial Virus NOT DETECTED NOT DETECTED Final   Bordetella pertussis NOT DETECTED NOT DETECTED Final   Chlamydophila pneumoniae NOT DETECTED NOT DETECTED Final   Mycoplasma pneumoniae NOT DETECTED NOT DETECTED Final  MRSA PCR Screening     Status: None   Collection Time: 06/27/17  6:47 PM  Result Value Ref Range Status   MRSA by PCR NEGATIVE NEGATIVE Final    Comment:        The GeneXpert MRSA Assay (FDA approved for NASAL specimens only), is one component of a comprehensive MRSA colonization surveillance program. It is not intended to diagnose MRSA infection nor to guide or monitor treatment for MRSA infections.      Labs: BNP (last 3 results) Recent Labs    06/27/17 0909  BNP 016.0*   Basic Metabolic Panel: Recent Labs  Lab 06/27/17 0909 06/27/17 1240 06/28/17 0338  NA 136  --  137  K 3.3*   --  3.7  CL 101  --  107  CO2 24  --  24  GLUCOSE 107*  --  114*  BUN 10  --  11  CREATININE 0.99  --  0.80  CALCIUM 8.8*  --  7.9*  MG  --  2.1  --    Liver Function Tests: Recent Labs  Lab 06/27/17 0909  AST 31  ALT 31  ALKPHOS 177*  BILITOT 1.2  PROT 7.8  ALBUMIN 3.4*   No results for input(s): LIPASE, AMYLASE in the last 168 hours. No results for input(s): AMMONIA in the last 168 hours. CBC: Recent Labs  Lab 06/27/17 0900 06/28/17 0338  WBC 18.6* 12.3*  HGB 12.8 11.4*  HCT 38.8 34.6*  MCV 88.8 87.4  PLT 320 339   Cardiac Enzymes: No results for input(s): CKTOTAL, CKMB, CKMBINDEX, TROPONINI in the last 168 hours. BNP: Invalid input(s): POCBNP CBG: No results for input(s): GLUCAP in the last 168 hours. D-Dimer No results for input(s): DDIMER in the last 72 hours. Hgb A1c No results for input(s): HGBA1C in the last 72 hours. Lipid Profile No results for input(s): CHOL, HDL, LDLCALC, TRIG, CHOLHDL, LDLDIRECT in the last 72 hours. Thyroid function studies No results for input(s): TSH, T4TOTAL, T3FREE, THYROIDAB in the last 72 hours.  Invalid input(s): FREET3 Anemia work up No results for input(s): VITAMINB12, FOLATE, FERRITIN, TIBC, IRON, RETICCTPCT in the last 72 hours. Urinalysis    Component Value Date/Time   COLORURINE YELLOW 06/27/2017 1648   APPEARANCEUR HAZY (A) 06/27/2017 1648   LABSPEC 1.009 06/27/2017 1648   PHURINE 5.0 06/27/2017 1648   GLUCOSEU NEGATIVE 06/27/2017 1648   HGBUR SMALL (A) 06/27/2017 1648   BILIRUBINUR NEGATIVE 06/27/2017 1648   KETONESUR NEGATIVE 06/27/2017 1648   PROTEINUR NEGATIVE 06/27/2017 1648   UROBILINOGEN 0.2 11/30/2012 1230   NITRITE NEGATIVE 06/27/2017 1648   LEUKOCYTESUR SMALL (A) 06/27/2017 1648   Sepsis Labs Invalid input(s): PROCALCITONIN,  WBC,  LACTICIDVEN Microbiology Recent Results (from the past 240 hour(s))  Respiratory Panel by PCR     Status: None   Collection Time: 06/27/17  4:43 PM  Result  Value Ref Range Status   Adenovirus NOT DETECTED NOT DETECTED Final   Coronavirus 229E NOT DETECTED NOT DETECTED Final   Coronavirus HKU1 NOT DETECTED NOT DETECTED Final   Coronavirus NL63 NOT DETECTED NOT DETECTED Final   Coronavirus OC43 NOT DETECTED NOT DETECTED Final   Metapneumovirus NOT DETECTED NOT DETECTED Final   Rhinovirus / Enterovirus NOT DETECTED NOT DETECTED Final   Influenza A NOT DETECTED NOT DETECTED Final   Influenza B NOT DETECTED NOT DETECTED Final   Parainfluenza Virus 1 NOT DETECTED NOT DETECTED Final   Parainfluenza Virus 2 NOT DETECTED NOT DETECTED Final   Parainfluenza Virus 3 NOT DETECTED NOT DETECTED Final   Parainfluenza Virus 4 NOT DETECTED NOT DETECTED Final   Respiratory Syncytial Virus NOT DETECTED NOT DETECTED Final   Bordetella pertussis NOT DETECTED NOT DETECTED Final   Chlamydophila pneumoniae NOT DETECTED NOT DETECTED Final   Mycoplasma pneumoniae NOT DETECTED NOT DETECTED Final  MRSA PCR Screening     Status: None   Collection Time: 06/27/17  6:47 PM  Result Value Ref Range Status   MRSA by PCR NEGATIVE NEGATIVE Final    Comment:        The GeneXpert MRSA Assay (FDA approved for NASAL specimens only), is one component of a comprehensive MRSA colonization surveillance program. It is not intended to diagnose MRSA infection nor to guide or monitor treatment for MRSA infections.      Time coordinating discharge: Over 30 minutes  SIGNED:   Elmarie Shiley, MD  [facility] 06/28/2017, 12:15 PM Pager   If 7PM-7AM, please contact night-coverage [email or web address] Password [email or web address]

## 2017-07-02 DIAGNOSIS — I480 Paroxysmal atrial fibrillation: Secondary | ICD-10-CM | POA: Diagnosis not present

## 2017-07-02 DIAGNOSIS — Z7901 Long term (current) use of anticoagulants: Secondary | ICD-10-CM | POA: Diagnosis not present

## 2017-07-02 LAB — CULTURE, BLOOD (ROUTINE X 2)
CULTURE: NO GROWTH
Culture: NO GROWTH
SPECIAL REQUESTS: ADEQUATE
SPECIAL REQUESTS: ADEQUATE

## 2017-07-02 MED FILL — PROGESTERONE 100 MG CAPSULE: 100 | 90 days supply | Qty: 90 | Fill #0

## 2017-07-02 MED FILL — CIPROFLOXACIN HCL 500 MG TA: 500 | 90 days supply | Qty: 180 | Fill #0

## 2017-07-02 MED FILL — GABAPENTIN 600 MG TABLET: 600 | 90 days supply | Qty: 360 | Fill #0

## 2017-07-04 DIAGNOSIS — D72829 Elevated white blood cell count, unspecified: Secondary | ICD-10-CM | POA: Diagnosis not present

## 2017-07-04 DIAGNOSIS — I480 Paroxysmal atrial fibrillation: Secondary | ICD-10-CM | POA: Diagnosis not present

## 2017-07-04 DIAGNOSIS — Q8901 Asplenia (congenital): Secondary | ICD-10-CM | POA: Diagnosis not present

## 2017-07-04 DIAGNOSIS — J189 Pneumonia, unspecified organism: Secondary | ICD-10-CM | POA: Diagnosis not present

## 2017-07-04 DIAGNOSIS — K6389 Other specified diseases of intestine: Secondary | ICD-10-CM | POA: Diagnosis not present

## 2017-07-04 DIAGNOSIS — M341 CR(E)ST syndrome: Secondary | ICD-10-CM | POA: Diagnosis not present

## 2017-07-04 DIAGNOSIS — Z79899 Other long term (current) drug therapy: Secondary | ICD-10-CM | POA: Diagnosis not present

## 2017-07-11 DIAGNOSIS — I788 Other diseases of capillaries: Secondary | ICD-10-CM | POA: Diagnosis not present

## 2017-07-11 DIAGNOSIS — I872 Venous insufficiency (chronic) (peripheral): Secondary | ICD-10-CM | POA: Diagnosis not present

## 2017-07-11 DIAGNOSIS — M341 CR(E)ST syndrome: Secondary | ICD-10-CM | POA: Diagnosis not present

## 2017-07-11 DIAGNOSIS — Z85828 Personal history of other malignant neoplasm of skin: Secondary | ICD-10-CM | POA: Diagnosis not present

## 2017-07-11 DIAGNOSIS — Z08 Encounter for follow-up examination after completed treatment for malignant neoplasm: Secondary | ICD-10-CM | POA: Diagnosis not present

## 2017-07-11 DIAGNOSIS — R600 Localized edema: Secondary | ICD-10-CM | POA: Diagnosis not present

## 2017-07-11 MED FILL — TRIAMCINOLONE ACETONIDE 0.1: 0.1 | 15 days supply | Qty: 80 | Fill #0

## 2017-07-13 MED FILL — METOPROLOL SUCC ER 25 MG TA: 25 | 90 days supply | Qty: 45 | Fill #1

## 2017-08-03 MED FILL — ESOMEPRAZOLE MAG DR 40 MG C: 40 | 30 days supply | Qty: 60 | Fill #3

## 2017-08-13 DIAGNOSIS — Z7901 Long term (current) use of anticoagulants: Secondary | ICD-10-CM | POA: Diagnosis not present

## 2017-08-24 MED FILL — NIFEDIPINE ER 30 MG TABLET: 30 | 30 days supply | Qty: 30 | Fill #2

## 2017-08-24 MED FILL — WARFARIN SODIUM 5 MG TABLET: 5 | 45 days supply | Qty: 90 | Fill #1

## 2017-08-24 MED FILL — MOMETASONE FUROATE 50 MCG S: 50 | 30 days supply | Qty: 17 | Fill #1

## 2017-08-29 DIAGNOSIS — R6882 Decreased libido: Secondary | ICD-10-CM | POA: Diagnosis not present

## 2017-08-31 MED FILL — FUROSEMIDE 40 MG TAB: 40 | 90 days supply | Qty: 180 | Fill #1

## 2017-08-31 MED FILL — ESOMEPRAZOLE MAG DR 40 MG C: 40 | 30 days supply | Qty: 60 | Fill #4

## 2017-09-03 MED FILL — POTASSIUM CL ER 20 MEQ TAB: 20 | 90 days supply | Qty: 90 | Fill #0

## 2017-09-26 DIAGNOSIS — R6882 Decreased libido: Secondary | ICD-10-CM | POA: Diagnosis not present

## 2017-10-01 MED FILL — ESOMEPRAZOLE MAG DR 40 MG C: 40 | 30 days supply | Qty: 60 | Fill #0

## 2017-10-03 MED FILL — PROGESTERONE MICRONIZED 100: 100 | 90 days supply | Qty: 90 | Fill #0

## 2017-10-03 MED FILL — METOPROLOL SUCCINATE ER 25: 25 | 90 days supply | Qty: 90 | Fill #0

## 2017-10-03 MED FILL — GABAPENTIN 600 MG TABLET: 600 | 90 days supply | Qty: 360 | Fill #0

## 2017-10-04 MED FILL — HYDROCODON-APAP 5-325: 5-325 | 8 days supply | Qty: 30 | Fill #0

## 2017-10-17 DIAGNOSIS — Z7901 Long term (current) use of anticoagulants: Secondary | ICD-10-CM | POA: Diagnosis not present

## 2017-10-17 MED FILL — CIPROFLOXACIN HCL 500 MG TA: 500 | 30 days supply | Qty: 60 | Fill #0

## 2017-10-18 DIAGNOSIS — Z7901 Long term (current) use of anticoagulants: Secondary | ICD-10-CM | POA: Diagnosis not present

## 2017-10-21 NOTE — Progress Notes (Signed)
Subjective:    Patient ID: [name], female    DOB: 02-12-1956, 62 y.o.   MRN: [id number]  Chief Complaint  Patient presents with  . New Patient (Initial Visit)    Asplenia, SBIO    HPI:  [name] is a 62 y.o. female who presents today for evaluation and recommendations associated with her asplenia and small bowel intestinal overgrowth.   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 She has also been diagnosed with CREST syndrome and small bowel intestinal overgrowth. She was most recently hospitalized at [facility] for shortness of breath where she was diagnosed with presumed viral and she was treated with a 5 day course of Augmentin secondary to her asplenic status. Per hospital notes she has received 2 doses of Pneumovax with the last dose being several years ago with her Prevnar given in 07/21/2013. She is currently taking ciprofloxacin twice daily and consistently for her small bowel intestinal overgrowth. There was concern during her hospitalization secondary to prolonged QT syndrome with her current medications. She stopped the Augmentin at home secondary to GI discomfort and resumed her ciprofloxacin. Per PCP she has had several infections over the past 2-3 years and is reluctant to stop the ciprofloxacin. All previous medical records, labs, and imaging reviewed in detail.  Indicates that she has been sick constantly. During the winter she describes getting bronchitis or pneumonia which is generally treated with antibiotics and sometimes steroids. She remains on ciprofloxacin for small bowel intestinal overgrowth. When she stops the medication she has increasing diarrhea and abdominal pains/cramping. This most recently occurred when she ran out of ciprofloxacin. She has been on Xifaxin in the past with no significant improvement in symptoms. She has not had carbohydrate breath test performed recently. Other modifying factors include watching her diet and  limiting carbohydrates.   No recent sickness / illness. Denies fevers, chills, nausea, vomiting, constipation, headaches or neck pain. Does have diarrhea once stopping ciprofloxacin.    Immunization History  Administered Date(s) Administered  . Influenza, Seasonal, Injecte, Preservative Fre 05/18/2016  . Influenza,inj,Quad PF,6+ Mos 05/12/2013  . Pneumococcal Conjugate-13 07/21/2013  . Pneumococcal Polysaccharide-23 10/22/2017     Allergies  Allergen Reactions  . Demerol   . Codeine Nausea And Vomiting  . Ultram [Tramadol Hcl] Other (See Comments)    Makes her hands shake.      Outpatient Medications Prior to Visit  Medication Sig Dispense Refill  . acetaminophen (TYLENOL) 500 MG tablet Take 1,000 mg by mouth every 6 (six) hours as needed for mild pain, fever or headache.    . Cholecalciferol (VITAMIN D3) 50000 units CAPS Take 50,000 capsules by mouth once a week.   1  . ciprofloxacin (CIPRO) 500 MG tablet Take 500 mg by mouth 2 (two) times daily.    . cycloSPORINE (RESTASIS) 0.05 % ophthalmic emulsion Place 1 drop into both eyes daily.    Marland Kitchen esomeprazole (NEXIUM) 40 MG capsule Take 40 mg by mouth 2 (two) times daily.    . furosemide (LASIX) 40 MG tablet Take 1 tablet (40 mg total) by mouth daily. NEED OV. (Patient taking differently: Take 80 mg by mouth daily. NEED OV.) 90 tablet 0  . gabapentin (NEURONTIN) 400 MG capsule Take 1 capsule (400 mg total) by mouth 3 (three) times daily. 90 capsule 3  . ipratropium (ATROVENT) 0.06 % nasal spray Place 2 sprays into the nose 4 (four) times daily as needed for rhinitis.    Marland Kitchen  metoprolol succinate (TOPROL XL) 25 MG 24 hr tablet Take 0.5 tablets (12.5 mg total) by mouth daily. 30 tablet 0  . mometasone (NASONEX) 50 MCG/ACT nasal spray Place 2 sprays into the nose daily as needed.     Marland Kitchen NIFEdipine (PROCARDIA-XL/ADALAT-CC/NIFEDICAL-XL) 30 MG 24 hr tablet Take 30 mg by mouth daily.  5  . potassium chloride SA (K-DUR,KLOR-CON) 20 MEQ tablet  TAKE 1 TABLET BY MOUTH DAILY (Patient taking differently: TAKE 1 TABLET 10 MEQ  BY MOUTH DAILY) 90 tablet 0  . progesterone (PROMETRIUM) 100 MG capsule Take 100 mg by mouth at bedtime.     . Risedronate Sodium (ACTONEL PO) Take 1 tablet by mouth every 30 (thirty) days.    Marland Kitchen warfarin (COUMADIN) 5 MG tablet Take 1 tablet (5 mg total) by mouth daily. 30 tablet 0  . albuterol (PROVENTIL HFA;VENTOLIN HFA) 108 (90 Base) MCG/ACT inhaler Inhale 1-2 puffs into the lungs every 6 (six) hours as needed.    Marland Kitchen amoxicillin-clavulanate (AUGMENTIN) 875-125 MG tablet Take 1 tablet by mouth every 12 (twelve) hours. (Patient not taking: Reported on 10/22/2017) 10 tablet 0  . oseltamivir (TAMIFLU) 75 MG capsule Take 1 capsule (75 mg total) by mouth 2 (two) times daily. (Patient not taking: Reported on 10/22/2017) 6 capsule 0   No facility-administered medications prior to visit.      Past Medical History:  Diagnosis Date  . Antiphospholipid antibody syndrome ([place])   . Arthritis    "all over; related to CREST" (06/27/2017)  . Chronic bronchitis ([place])    "constantly in the winter" (06/27/2017)  . 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 DVT (deep venous thrombosis) ([place]) 1992; 1995   RLE; 1995  . GERD (gastroesophageal reflux disease)   . Heart murmur   . Hemorrhagic pericardial effusion 11/07/2012  . Hypertension   . ITP (idiopathic thrombocytopenic purpura)   . MVP (mitral valve prolapse)   . Paroxysmal atrial fibrillation ([place]) 09/18/2011  . Pneumonia    "constantly in the winter" (06/27/2017)  . Raynaud's phenomenon   . 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 Thyroid goiter       Past Surgical History:  Procedure Laterality Date  . ENDOMETRIAL ABLATION  ~ 2007  . SPLENECTOMY  1995   ITP  . SQUAMOUS CELL CARCINOMA EXCISION Right 2018   "lower leg"  . SUBXYPHOID PERICARDIAL WINDOW N/A 11/08/2012   Procedure: SUBXYPHOID PERICARDIAL  WINDOW;  Surgeon: Rexene Alberts, MD;  [facility];  Service: Thoracic;  Laterality: N/A;  . TUBAL LIGATION        Family History  Problem Relation Age of Onset  . Cancer Mother        Lung  . Diabetes Father   . COPD Father   . Hyperlipidemia Father   . Hypertension Father       Social History   Socioeconomic History  . Marital status: Married    Spouse name: Jenny Reichmann  . Number of children: 2  . Years of education: college  . Highest education level: Not on file  Social Needs  . Financial resource strain: Not on file  . Food insecurity - worry: Not on file  . Food insecurity - inability: Not on file  . Transportation needs - medical: Not on file  . Transportation needs - non-medical: Not on file  Occupational History  . Occupation: Aeronautical engineer:  [employer]    Comment: RN -  Tobacco Use  . Smoking status: Never Smoker  . Smokeless tobacco: Never Used  Substance and Sexual Activity  . Alcohol use: Yes    Alcohol/week: 3.6 oz    Types: 6 Glasses of wine per week  . Drug use: No  . Sexual activity: Not Currently  Other Topics Concern  . Not on file  Social History Narrative   Two children and 3 step children and five grandchildren.  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   Right handed.   Caffeine-three or four cups of coffee daily. Tea also   Education- College          Review of Systems  Constitutional: Negative for appetite change, chills, diaphoresis, fatigue, fever and unexpected weight change.  HENT: Negative for congestion.   Respiratory: Negative for cough, chest tightness, shortness of breath and wheezing.   Cardiovascular: Negative for chest pain, palpitations and leg swelling.  Gastrointestinal: Negative for abdominal distention, abdominal pain, anal bleeding, blood in stool, constipation, nausea and vomiting.  Musculoskeletal: Negative for neck pain and neck stiffness.       Objective:    BP 139/76 (BP  Location: Right Arm, Patient Position: Sitting, Cuff Size: Normal)   Pulse (!) 54   Temp (!) 97.5 F (36.4 C) (Oral)   Ht 5\' 6"  (1.676 m)   Wt 184 lb (83.5 kg)   BMI 29.70 kg/m  Nursing note and vital signs reviewed.  Physical Exam  Constitutional: She is oriented to person, place, and time. She appears well-developed and well-nourished. No distress.  HENT:  Mouth/Throat: Oropharynx is clear and moist.  Neck: Neck supple.  Cardiovascular: Normal rate, regular rhythm, normal heart sounds and intact distal pulses. Exam reveals no gallop and no friction rub.  No murmur heard. Pulmonary/Chest: Effort normal and breath sounds normal. No respiratory distress. She has no wheezes. She has no rales. She exhibits no tenderness.  Neurological: She is alert and oriented to person, place, and time.  Skin: Skin is warm and dry.  Psychiatric: She has a normal mood and affect. Her behavior is normal. Judgment and thought content normal.        Assessment & Plan:   Problem List Items Addressed This Visit      Digestive   Small intestinal bacterial overgrowth    Currently maintained on ciprofloxacin twice daily with the primary symptoms of abdominal cramping and diarrhea when she stops medication. Concern for irritable bowel syndrome with diarrhea as opposed to small intestinal bacterial overgrowth. Recommend breath testing and follow-up with gastroenterology. Discussed long-term risks of ciprofloxacin including C. difficile as well as tendinopathy use and possible interactions with other medications prolonging QT syndrome.      Relevant Orders   Pneumococcal polysaccharide vaccine 23-valent greater than or equal to 2yo subcutaneous/IM (Completed)     Other   S/P splenectomy - Primary    Status post splenectomy with multiple infections per patient primarily in the winter months. Recommend updating vaccinations with Pneumovax provided today. List of recommended immunizations provided and after  visit summary and discussed with patient. Emphasized that antibiotics may not assist with viral infections. Encouraged possible emergency antibiotics for encapsulated bacteria as needed. No indications for daily prophylaxis at this time. Continue to follow-up with PCP and happy to see back as needed.      Relevant Orders   Pneumococcal polysaccharide vaccine 23-valent greater than or equal to 2yo subcutaneous/IM (Completed)  Other Visit Diagnoses    Need for pneumococcal vaccine       Relevant Orders   Pneumococcal polysaccharide vaccine 23-valent greater than or equal to 2yo subcutaneous/IM (Completed)        I am having Layal A. Magan maintain her progesterone, Risedronate Sodium (ACTONEL PO), mometasone, ipratropium, esomeprazole, gabapentin, potassium chloride SA, furosemide, albuterol, Vitamin D3, NIFEdipine, cycloSPORINE, acetaminophen, amoxicillin-clavulanate, metoprolol succinate, oseltamivir, warfarin, and ciprofloxacin.   Follow-up:  As needed if symptoms return.    Mauricio Po, [facility] Infectious [facility]

## 2017-10-22 ENCOUNTER — Ambulatory Visit (INDEPENDENT_AMBULATORY_CARE_PROVIDER_SITE_OTHER): Payer: 59 | Admitting: Family

## 2017-10-22 ENCOUNTER — Encounter: Payer: Self-pay | Admitting: Family

## 2017-10-22 VITALS — BP 139/76 | HR 54 | Temp 97.5°F | Ht 66.0 in | Wt 184.0 lb

## 2017-10-22 DIAGNOSIS — K6389 Other specified diseases of intestine: Secondary | ICD-10-CM | POA: Diagnosis not present

## 2017-10-22 DIAGNOSIS — Z9081 Acquired absence of spleen: Secondary | ICD-10-CM | POA: Diagnosis not present

## 2017-10-22 DIAGNOSIS — Z23 Encounter for immunization: Secondary | ICD-10-CM

## 2017-10-22 DIAGNOSIS — Z7901 Long term (current) use of anticoagulants: Secondary | ICD-10-CM | POA: Diagnosis not present

## 2017-10-22 NOTE — Assessment & Plan Note (Signed)
Currently maintained on ciprofloxacin twice daily with the primary symptoms of abdominal cramping and diarrhea when she stops medication. Concern for irritable bowel syndrome with diarrhea as opposed to small intestinal bacterial overgrowth. Recommend breath testing and follow-up with gastroenterology. Discussed long-term risks of ciprofloxacin including C. difficile as well as tendinopathy use and possible interactions with other medications prolonging QT syndrome.

## 2017-10-22 NOTE — Patient Instructions (Addendum)
Very nice to meet you!  Recommend that you follow up with Gastroenterology for a breath test to confirm that you have bacterial overgrowth. Based on what you are describing there is concern for irritable bowel syndrome with diarrhea.   Below are the recommendations for your immunizations and if necessary antibiotics.  We have given you a dose of Pneumovax today which will be due again in 5 years.    Based on your previous splenectomy we recommend the following immunizations:  Haemophilus influenzae type b vaccine - Once  PPSV23 (Pneumovax) - Every 5 years (Last done today)  Meningococcal serotype ACWY vaccine (Menveo or Menactra) - 2 doses at least 8 weeks apart then every 5 years  Meningococcal serotype B (Trumemba or Bexsero) - 2 doses of MenB-4C at least 1 month apart or 3 doses of MenB-FHbp at 0, 1 to 2, and 6 month  Seasonal influenza virus - Annually  Antibiotic Recommendations -  Daily prophylaxis - Amoxicillin 500 mg twice daily; cephalexin 250 mg twice daily; Azithromycin 250 mg daily Only if having increased episodes of bacterial infections.    Emergency Antibiotics (to have on hand in case of fever to cover for encapsulated bacteria - 5-7 days of either should be more than enough)- cefuroxime 500 mg twice daily; OR  levofloxacin 750 mg daily  We are happy to see you back as needed.

## 2017-10-22 NOTE — Assessment & Plan Note (Signed)
Status post splenectomy with multiple infections per patient primarily in the winter months. Recommend updating vaccinations with Pneumovax provided today. List of recommended immunizations provided and after visit summary and discussed with patient. Emphasized that antibiotics may not assist with viral infections. Encouraged possible emergency antibiotics for encapsulated bacteria as needed. No indications for daily prophylaxis at this time. Continue to follow-up with PCP and happy to see back as needed.

## 2017-10-25 DIAGNOSIS — M341 CR(E)ST syndrome: Secondary | ICD-10-CM | POA: Diagnosis not present

## 2017-10-25 DIAGNOSIS — Z7901 Long term (current) use of anticoagulants: Secondary | ICD-10-CM | POA: Diagnosis not present

## 2017-11-01 DIAGNOSIS — Z7901 Long term (current) use of anticoagulants: Secondary | ICD-10-CM | POA: Diagnosis not present

## 2017-11-01 DIAGNOSIS — M341 CR(E)ST syndrome: Secondary | ICD-10-CM | POA: Diagnosis not present

## 2017-11-02 MED FILL — ESOMEPRAZOLE MAG DR 40 MG C: 40 | 30 days supply | Qty: 60 | Fill #1

## 2017-11-02 MED FILL — NIFEDIPINE ER 30 MG TABLET: 30 | 30 days supply | Qty: 30 | Fill #3

## 2017-11-06 DIAGNOSIS — Z7901 Long term (current) use of anticoagulants: Secondary | ICD-10-CM | POA: Diagnosis not present

## 2017-11-06 MED FILL — WARFARIN SODIUM 5 MG TABLET: 5 | 45 days supply | Qty: 90 | Fill #0

## 2017-11-13 DIAGNOSIS — J069 Acute upper respiratory infection, unspecified: Secondary | ICD-10-CM | POA: Diagnosis not present

## 2017-11-13 DIAGNOSIS — I480 Paroxysmal atrial fibrillation: Secondary | ICD-10-CM | POA: Diagnosis not present

## 2017-11-13 DIAGNOSIS — K6389 Other specified diseases of intestine: Secondary | ICD-10-CM | POA: Diagnosis not present

## 2017-11-13 DIAGNOSIS — K529 Noninfective gastroenteritis and colitis, unspecified: Secondary | ICD-10-CM | POA: Diagnosis not present

## 2017-11-28 DIAGNOSIS — Z7901 Long term (current) use of anticoagulants: Secondary | ICD-10-CM | POA: Diagnosis not present

## 2017-11-29 ENCOUNTER — Other Ambulatory Visit (HOSPITAL_COMMUNITY)
Admission: RE | Admit: 2017-11-29 | Discharge: 2017-11-29 | Disposition: A | Discharge status: Home / Self Care | Payer: 59 | Source: Ambulatory Visit | Attending: Pain Medicine | Admitting: Pain Medicine

## 2017-11-29 DIAGNOSIS — Z5181 Encounter for therapeutic drug level monitoring: Secondary | ICD-10-CM | POA: Insufficient documentation

## 2017-11-29 DIAGNOSIS — Z7901 Long term (current) use of anticoagulants: Secondary | ICD-10-CM | POA: Insufficient documentation

## 2017-11-29 DIAGNOSIS — M341 CR(E)ST syndrome: Secondary | ICD-10-CM | POA: Insufficient documentation

## 2017-11-29 LAB — PROTIME-INR
INR: 3.12
PROTHROMBIN TIME: 31.8 s — AB (ref 11.4–15.2)

## 2017-12-06 DIAGNOSIS — Z7901 Long term (current) use of anticoagulants: Secondary | ICD-10-CM | POA: Diagnosis not present

## 2017-12-06 MED FILL — ESOMEPRAZOLE MAG DR 40 MG C: 40 | 30 days supply | Qty: 60 | Fill #2

## 2017-12-06 MED FILL — POTASSIUM CL ER 20 MEQ TABL: 20 | 90 days supply | Qty: 90 | Fill #0

## 2017-12-07 MED FILL — FUROSEMIDE 40 MG TAB: 40 | 90 days supply | Qty: 180 | Fill #0

## 2017-12-17 DIAGNOSIS — M340 Progressive systemic sclerosis: Secondary | ICD-10-CM | POA: Diagnosis not present

## 2017-12-17 DIAGNOSIS — D693 Immune thrombocytopenic purpura: Secondary | ICD-10-CM | POA: Diagnosis not present

## 2017-12-17 DIAGNOSIS — K219 Gastro-esophageal reflux disease without esophagitis: Secondary | ICD-10-CM | POA: Diagnosis not present

## 2017-12-17 DIAGNOSIS — E559 Vitamin D deficiency, unspecified: Secondary | ICD-10-CM | POA: Diagnosis not present

## 2017-12-17 DIAGNOSIS — E663 Overweight: Secondary | ICD-10-CM | POA: Diagnosis not present

## 2017-12-17 DIAGNOSIS — Z6829 Body mass index (BMI) 29.0-29.9, adult: Secondary | ICD-10-CM | POA: Diagnosis not present

## 2017-12-17 DIAGNOSIS — M150 Primary generalized (osteo)arthritis: Secondary | ICD-10-CM | POA: Diagnosis not present

## 2017-12-17 DIAGNOSIS — I7300 Raynaud's syndrome without gangrene: Secondary | ICD-10-CM | POA: Diagnosis not present

## 2017-12-17 DIAGNOSIS — M8580 Other specified disorders of bone density and structure, unspecified site: Secondary | ICD-10-CM | POA: Diagnosis not present

## 2017-12-20 DIAGNOSIS — Z7901 Long term (current) use of anticoagulants: Secondary | ICD-10-CM | POA: Diagnosis not present

## 2017-12-26 MED FILL — HYDROCODON-APAP 5-325: 5-325 | 7 days supply | Qty: 30 | Fill #0

## 2018-01-02 MED FILL — ESOMEPRAZOLE MAG DR 40 MG C: 40 | 30 days supply | Qty: 60 | Fill #3

## 2018-01-02 MED FILL — NIFEDIPINE ER 30 MG TABLET: 30 | 30 days supply | Qty: 30 | Fill #4

## 2018-01-02 MED FILL — PROGESTERONE 100 MG CAPSULE: 100 | 90 days supply | Qty: 90 | Fill #0

## 2018-01-02 MED FILL — GABAPENTIN 600 MG TABLET: 600 | 90 days supply | Qty: 360 | Fill #0

## 2018-01-07 MED FILL — METOPROLOL SUCCINATE ER 25: 25 | 90 days supply | Qty: 90 | Fill #0

## 2018-01-09 DIAGNOSIS — I872 Venous insufficiency (chronic) (peripheral): Secondary | ICD-10-CM | POA: Diagnosis not present

## 2018-01-09 DIAGNOSIS — Z08 Encounter for follow-up examination after completed treatment for malignant neoplasm: Secondary | ICD-10-CM | POA: Diagnosis not present

## 2018-01-09 DIAGNOSIS — L814 Other melanin hyperpigmentation: Secondary | ICD-10-CM | POA: Diagnosis not present

## 2018-01-09 DIAGNOSIS — D225 Melanocytic nevi of trunk: Secondary | ICD-10-CM | POA: Diagnosis not present

## 2018-01-09 DIAGNOSIS — M341 CR(E)ST syndrome: Secondary | ICD-10-CM | POA: Diagnosis not present

## 2018-01-09 DIAGNOSIS — Z85828 Personal history of other malignant neoplasm of skin: Secondary | ICD-10-CM | POA: Diagnosis not present

## 2018-01-09 DIAGNOSIS — D485 Neoplasm of uncertain behavior of skin: Secondary | ICD-10-CM | POA: Diagnosis not present

## 2018-01-09 DIAGNOSIS — L853 Xerosis cutis: Secondary | ICD-10-CM | POA: Diagnosis not present

## 2018-01-09 DIAGNOSIS — L905 Scar conditions and fibrosis of skin: Secondary | ICD-10-CM | POA: Diagnosis not present

## 2018-01-09 DIAGNOSIS — I788 Other diseases of capillaries: Secondary | ICD-10-CM | POA: Diagnosis not present

## 2018-01-23 DIAGNOSIS — R6882 Decreased libido: Secondary | ICD-10-CM | POA: Diagnosis not present

## 2018-01-24 DIAGNOSIS — Z7901 Long term (current) use of anticoagulants: Secondary | ICD-10-CM | POA: Diagnosis not present

## 2018-01-24 DIAGNOSIS — R0602 Shortness of breath: Secondary | ICD-10-CM | POA: Diagnosis not present

## 2018-01-24 DIAGNOSIS — M341 CR(E)ST syndrome: Secondary | ICD-10-CM | POA: Diagnosis not present

## 2018-01-24 DIAGNOSIS — D6861 Antiphospholipid syndrome: Secondary | ICD-10-CM | POA: Diagnosis not present

## 2018-01-28 MED FILL — WARFARIN SODIUM 5 MG TABLET: 5 | 45 days supply | Qty: 90 | Fill #0

## 2018-01-30 DIAGNOSIS — Z23 Encounter for immunization: Secondary | ICD-10-CM | POA: Diagnosis not present

## 2018-02-14 DIAGNOSIS — Z7901 Long term (current) use of anticoagulants: Secondary | ICD-10-CM | POA: Diagnosis not present

## 2018-02-15 MED FILL — ESOMEPRAZOLE MAG DR 40 MG C: 40 | 30 days supply | Qty: 60 | Fill #0

## 2018-02-20 DIAGNOSIS — L821 Other seborrheic keratosis: Secondary | ICD-10-CM | POA: Diagnosis not present

## 2018-02-28 DIAGNOSIS — Z01419 Encounter for gynecological examination (general) (routine) without abnormal findings: Secondary | ICD-10-CM | POA: Diagnosis not present

## 2018-02-28 DIAGNOSIS — Z1231 Encounter for screening mammogram for malignant neoplasm of breast: Secondary | ICD-10-CM | POA: Diagnosis not present

## 2018-02-28 DIAGNOSIS — Z6830 Body mass index (BMI) 30.0-30.9, adult: Secondary | ICD-10-CM | POA: Diagnosis not present

## 2018-02-28 DIAGNOSIS — R6882 Decreased libido: Secondary | ICD-10-CM | POA: Diagnosis not present

## 2018-02-28 MED FILL — DIETHYLPROPION ER 75 MG TAB: 75 | 30 days supply | Qty: 30 | Fill #0

## 2018-03-05 DIAGNOSIS — Z7901 Long term (current) use of anticoagulants: Secondary | ICD-10-CM | POA: Diagnosis not present

## 2018-03-05 DIAGNOSIS — M341 CR(E)ST syndrome: Secondary | ICD-10-CM | POA: Diagnosis not present

## 2018-03-05 DIAGNOSIS — G8929 Other chronic pain: Secondary | ICD-10-CM | POA: Diagnosis not present

## 2018-03-05 MED FILL — HYDROCODON-APAP 5-325: 5-325 | 7 days supply | Qty: 30 | Fill #0

## 2018-03-05 MED FILL — ALENDRONATE NA 70 MG TAB: 70 | 84 days supply | Qty: 12 | Fill #0

## 2018-03-06 MED FILL — POTASSIUM CL ER 20 MEQ TABL: 20 | 90 days supply | Qty: 90 | Fill #1

## 2018-03-13 DIAGNOSIS — Z7901 Long term (current) use of anticoagulants: Secondary | ICD-10-CM | POA: Diagnosis not present

## 2018-03-15 MED FILL — FUROSEMIDE 40 MG TAB: 40 | 90 days supply | Qty: 180 | Fill #1

## 2018-03-15 MED FILL — NIFEDIPINE ER 30 MG TABLET: 30 | 30 days supply | Qty: 30 | Fill #0

## 2018-03-22 DIAGNOSIS — M341 CR(E)ST syndrome: Secondary | ICD-10-CM | POA: Diagnosis not present

## 2018-03-22 DIAGNOSIS — Z7901 Long term (current) use of anticoagulants: Secondary | ICD-10-CM | POA: Diagnosis not present

## 2018-03-22 MED FILL — IPRATROPIUM 0.03% SPRAY: 0.03 | 21 days supply | Qty: 30 | Fill #0

## 2018-03-25 MED FILL — DIETHYLPROPION ER 75 MG TAB: 75 | 30 days supply | Qty: 30 | Fill #1

## 2018-03-25 MED FILL — METOPROLOL SUCCINATE ER 25: 25 | 90 days supply | Qty: 90 | Fill #1

## 2018-03-25 MED FILL — PROGESTERONE 100 MG CAPSULE: 100 | 90 days supply | Qty: 90 | Fill #0

## 2018-03-25 MED FILL — ESOMEPRAZOLE MAG DR 40 MG C: 40 | 30 days supply | Qty: 60 | Fill #1

## 2018-03-26 MED FILL — MOMETASONE FUROATE 50 MCG S: 50 | 30 days supply | Qty: 17 | Fill #0

## 2018-04-02 MED FILL — GABAPENTIN 600 MG TABLET: 600 | 90 days supply | Qty: 360 | Fill #0

## 2018-04-03 DIAGNOSIS — R6882 Decreased libido: Secondary | ICD-10-CM | POA: Diagnosis not present

## 2018-04-05 DIAGNOSIS — Z7901 Long term (current) use of anticoagulants: Secondary | ICD-10-CM | POA: Diagnosis not present

## 2018-04-05 DIAGNOSIS — R5383 Other fatigue: Secondary | ICD-10-CM | POA: Diagnosis not present

## 2018-04-05 DIAGNOSIS — I480 Paroxysmal atrial fibrillation: Secondary | ICD-10-CM | POA: Diagnosis not present

## 2018-04-10 DIAGNOSIS — I480 Paroxysmal atrial fibrillation: Secondary | ICD-10-CM | POA: Diagnosis not present

## 2018-04-10 DIAGNOSIS — R0683 Snoring: Secondary | ICD-10-CM | POA: Diagnosis not present

## 2018-04-10 DIAGNOSIS — Z7901 Long term (current) use of anticoagulants: Secondary | ICD-10-CM | POA: Diagnosis not present

## 2018-04-15 MED FILL — WARFARIN SODIUM 5 MG TABLET: 5 | 45 days supply | Qty: 90 | Fill #0

## 2018-04-18 DIAGNOSIS — Z7901 Long term (current) use of anticoagulants: Secondary | ICD-10-CM | POA: Diagnosis not present

## 2018-04-25 DIAGNOSIS — Z7901 Long term (current) use of anticoagulants: Secondary | ICD-10-CM | POA: Diagnosis not present

## 2018-04-29 MED FILL — ESOMEPRAZOLE MAG DR 40 MG C: 40 | 30 days supply | Qty: 60 | Fill #2

## 2018-05-22 DIAGNOSIS — Z23 Encounter for immunization: Secondary | ICD-10-CM | POA: Diagnosis not present

## 2018-05-22 DIAGNOSIS — L298 Other pruritus: Secondary | ICD-10-CM | POA: Diagnosis not present

## 2018-05-22 DIAGNOSIS — D485 Neoplasm of uncertain behavior of skin: Secondary | ICD-10-CM | POA: Diagnosis not present

## 2018-05-22 DIAGNOSIS — L821 Other seborrheic keratosis: Secondary | ICD-10-CM | POA: Diagnosis not present

## 2018-05-22 DIAGNOSIS — L538 Other specified erythematous conditions: Secondary | ICD-10-CM | POA: Diagnosis not present

## 2018-05-22 DIAGNOSIS — L820 Inflamed seborrheic keratosis: Secondary | ICD-10-CM | POA: Diagnosis not present

## 2018-05-23 DIAGNOSIS — Z7901 Long term (current) use of anticoagulants: Secondary | ICD-10-CM | POA: Diagnosis not present

## 2018-05-23 MED FILL — ESOMEPRAZOLE MAG DR 40 MG C: 40 | 30 days supply | Qty: 60 | Fill #3

## 2018-05-31 ENCOUNTER — Emergency Department (HOSPITAL_COMMUNITY): Payer: 59

## 2018-05-31 ENCOUNTER — Other Ambulatory Visit: Payer: Self-pay

## 2018-05-31 ENCOUNTER — Encounter (HOSPITAL_COMMUNITY): Payer: Self-pay | Admitting: Emergency Medicine

## 2018-05-31 ENCOUNTER — Inpatient Hospital Stay (HOSPITAL_COMMUNITY)
Admission: EM | Admit: 2018-05-31 | Discharge: 2018-06-02 | DRG: 603 | Disposition: A | Discharge status: Home / Self Care | Payer: 59 | Attending: Internal Medicine | Admitting: Internal Medicine

## 2018-05-31 DIAGNOSIS — L03211 Cellulitis of face: Secondary | ICD-10-CM | POA: Diagnosis not present

## 2018-05-31 DIAGNOSIS — K219 Gastro-esophageal reflux disease without esophagitis: Secondary | ICD-10-CM | POA: Diagnosis not present

## 2018-05-31 DIAGNOSIS — J3489 Other specified disorders of nose and nasal sinuses: Secondary | ICD-10-CM | POA: Diagnosis not present

## 2018-05-31 DIAGNOSIS — K05219 Aggressive periodontitis, localized, unspecified severity: Secondary | ICD-10-CM | POA: Diagnosis present

## 2018-05-31 DIAGNOSIS — L0390 Cellulitis, unspecified: Secondary | ICD-10-CM | POA: Diagnosis present

## 2018-05-31 DIAGNOSIS — Z7983 Long term (current) use of bisphosphonates: Secondary | ICD-10-CM

## 2018-05-31 DIAGNOSIS — Z8249 Family history of ischemic heart disease and other diseases of the circulatory system: Secondary | ICD-10-CM

## 2018-05-31 DIAGNOSIS — Z9081 Acquired absence of spleen: Secondary | ICD-10-CM

## 2018-05-31 DIAGNOSIS — M341 CR(E)ST syndrome: Secondary | ICD-10-CM | POA: Diagnosis present

## 2018-05-31 DIAGNOSIS — Z8701 Personal history of pneumonia (recurrent): Secondary | ICD-10-CM

## 2018-05-31 DIAGNOSIS — D693 Immune thrombocytopenic purpura: Secondary | ICD-10-CM | POA: Diagnosis present

## 2018-05-31 DIAGNOSIS — R011 Cardiac murmur, unspecified: Secondary | ICD-10-CM | POA: Diagnosis present

## 2018-05-31 DIAGNOSIS — D6861 Antiphospholipid syndrome: Secondary | ICD-10-CM | POA: Diagnosis present

## 2018-05-31 DIAGNOSIS — D72829 Elevated white blood cell count, unspecified: Secondary | ICD-10-CM | POA: Diagnosis not present

## 2018-05-31 DIAGNOSIS — I10 Essential (primary) hypertension: Secondary | ICD-10-CM | POA: Diagnosis present

## 2018-05-31 DIAGNOSIS — J42 Unspecified chronic bronchitis: Secondary | ICD-10-CM | POA: Diagnosis present

## 2018-05-31 DIAGNOSIS — I341 Nonrheumatic mitral (valve) prolapse: Secondary | ICD-10-CM | POA: Diagnosis present

## 2018-05-31 DIAGNOSIS — Z86718 Personal history of other venous thrombosis and embolism: Secondary | ICD-10-CM

## 2018-05-31 DIAGNOSIS — E876 Hypokalemia: Secondary | ICD-10-CM | POA: Diagnosis not present

## 2018-05-31 DIAGNOSIS — M1990 Unspecified osteoarthritis, unspecified site: Secondary | ICD-10-CM | POA: Diagnosis not present

## 2018-05-31 DIAGNOSIS — Z885 Allergy status to narcotic agent status: Secondary | ICD-10-CM

## 2018-05-31 DIAGNOSIS — I480 Paroxysmal atrial fibrillation: Secondary | ICD-10-CM | POA: Diagnosis present

## 2018-05-31 DIAGNOSIS — R229 Localized swelling, mass and lump, unspecified: Secondary | ICD-10-CM | POA: Diagnosis not present

## 2018-05-31 DIAGNOSIS — Z7901 Long term (current) use of anticoagulants: Secondary | ICD-10-CM

## 2018-05-31 DIAGNOSIS — Z79890 Hormone replacement therapy (postmenopausal): Secondary | ICD-10-CM

## 2018-05-31 DIAGNOSIS — Z79899 Other long term (current) drug therapy: Secondary | ICD-10-CM

## 2018-05-31 LAB — BASIC METABOLIC PANEL
ANION GAP: 9 (ref 5–15)
BUN: 15 mg/dL (ref 8–23)
CALCIUM: 9.3 mg/dL (ref 8.9–10.3)
CHLORIDE: 105 mmol/L (ref 98–111)
CO2: 26 mmol/L (ref 22–32)
CREATININE: 0.77 mg/dL (ref 0.44–1.00)
GFR calc non Af Amer: >60 mL/min (ref >60)
Glucose, Bld: 89 mg/dL (ref 70–99)
Potassium: 3.2 mmol/L — ABNORMAL LOW (ref 3.5–5.1)
SODIUM: 140 mmol/L (ref 135–145)

## 2018-05-31 LAB — CBC WITH DIFFERENTIAL/PLATELET
Abs Immature Granulocytes: 0.04 10*3/uL (ref 0.00–0.07)
BASOS ABS: 0.1 10*3/uL (ref 0.0–0.1)
Basophils Relative: 1 %
EOS ABS: 0.1 10*3/uL (ref 0.0–0.5)
Eosinophils Relative: 0 %
HEMATOCRIT: 45.7 % (ref 36.0–46.0)
HEMOGLOBIN: 14.3 g/dL (ref 12.0–15.0)
IMMATURE GRANULOCYTES: 0 %
Lymphocytes Relative: 13 %
Lymphs Abs: 1.9 10*3/uL (ref 0.7–4.0)
MCH: 28.1 pg (ref 26.0–34.0)
MCHC: 31.3 g/dL (ref 30.0–36.0)
MCV: 90 fL (ref 80.0–100.0)
Monocytes Absolute: 1.7 10*3/uL — ABNORMAL HIGH (ref 0.1–1.0)
Monocytes Relative: 11 %
NEUTROS ABS: 11 10*3/uL — AB (ref 1.7–7.7)
NRBC: 0 % (ref 0.0–0.2)
Neutrophils Relative %: 75 %
Platelets: 299 10*3/uL (ref 150–400)
RBC: 5.08 MIL/uL (ref 3.87–5.11)
RDW: 16.2 % — AB (ref 11.5–15.5)
WBC: 14.7 10*3/uL — AB (ref 4.0–10.5)

## 2018-05-31 LAB — PROTIME-INR
INR: 2.26
PROTHROMBIN TIME: 24.6 s — AB (ref 11.4–15.2)

## 2018-05-31 MED ORDER — CHLORHEXIDINE GLUCONATE 0.12 % MT SOLN
15.0000 mL | Freq: Four times a day (QID) | OROMUCOSAL | Status: DC
  Administered 2018-05-31 – 2018-06-01 (×6): 15 mL via OROMUCOSAL
  Filled 2018-05-31 (×6): qty 15

## 2018-05-31 MED ORDER — GABAPENTIN 300 MG PO CAPS
1200.0000 mg | ORAL_CAPSULE | Freq: Once | ORAL | Status: AC
  Administered 2018-05-31: 1200 mg via ORAL
  Filled 2018-05-31: qty 4

## 2018-05-31 MED ORDER — POTASSIUM CHLORIDE CRYS ER 20 MEQ PO TBCR
40.0000 meq | EXTENDED_RELEASE_TABLET | Freq: Once | ORAL | Status: AC
  Administered 2018-05-31: 40 meq via ORAL
  Filled 2018-05-31: qty 2

## 2018-05-31 MED ORDER — POTASSIUM CHLORIDE 10 MEQ/100ML IV SOLN
10.0000 meq | INTRAVENOUS | Status: AC
  Administered 2018-05-31: 10 meq via INTRAVENOUS
  Filled 2018-05-31: qty 100

## 2018-05-31 MED ORDER — SACCHAROMYCES BOULARDII 250 MG PO CAPS
250.0000 mg | ORAL_CAPSULE | Freq: Two times a day (BID) | ORAL | Status: DC
  Administered 2018-05-31 – 2018-06-02 (×4): 250 mg via ORAL
  Filled 2018-05-31 (×4): qty 1

## 2018-05-31 MED ORDER — IOHEXOL 300 MG/ML  SOLN
75.0000 mL | Freq: Once | INTRAMUSCULAR | Status: AC | PRN
  Administered 2018-05-31: 75 mL via INTRAVENOUS

## 2018-05-31 MED ORDER — METOPROLOL SUCCINATE ER 25 MG PO TB24
12.5000 mg | ORAL_TABLET | Freq: Every day | ORAL | Status: DC
  Administered 2018-06-02: 12.5 mg via ORAL
  Filled 2018-05-31 (×2): qty 1

## 2018-05-31 MED ORDER — CLINDAMYCIN PHOSPHATE 600 MG/50ML IV SOLN
600.0000 mg | Freq: Once | INTRAVENOUS | Status: AC
  Administered 2018-05-31: 600 mg via INTRAVENOUS
  Filled 2018-05-31: qty 50

## 2018-05-31 MED ORDER — METRONIDAZOLE IN NACL 5-0.79 MG/ML-% IV SOLN
500.0000 mg | Freq: Three times a day (TID) | INTRAVENOUS | Status: DC
  Administered 2018-05-31 – 2018-06-01 (×3): 500 mg via INTRAVENOUS
  Filled 2018-05-31 (×3): qty 100

## 2018-05-31 MED ORDER — SODIUM CHLORIDE 0.9 % IV SOLN
INTRAVENOUS | Status: DC
  Administered 2018-05-31: 20 mL/h via INTRAVENOUS
  Administered 2018-05-31: 1 mL via INTRAVENOUS

## 2018-05-31 MED ORDER — POTASSIUM CHLORIDE 10 MEQ/100ML IV SOLN: 10.0000 meq | Freq: Once | INTRAVENOUS | Status: AC

## 2018-05-31 MED ORDER — CEFAZOLIN SODIUM-DEXTROSE 2-4 GM/100ML-% IV SOLN
2.0000 g | Freq: Three times a day (TID) | INTRAVENOUS | Status: DC
  Administered 2018-05-31 – 2018-06-01 (×3): 2 g via INTRAVENOUS
  Filled 2018-05-31 (×3): qty 100

## 2018-05-31 MED ORDER — POTASSIUM CHLORIDE 10 MEQ/100ML IV SOLN
INTRAVENOUS | Status: AC
  Administered 2018-05-31: 10 meq
  Filled 2018-05-31: qty 100

## 2018-05-31 MED ORDER — WARFARIN SODIUM 5 MG PO TABS: 5.0000 mg | ORAL_TABLET | ORAL | Status: DC

## 2018-05-31 NOTE — ED Notes (Signed)
ED TO INPATIENT HANDOFF REPORT  Name/Age/Gender [name] 62 y.o. female  Code Status    Code Status Orders  (From admission, onward)         Start     Ordered   05/31/18 1548  Full code  Continuous     05/31/18 1547        Code Status History    Date Active Date Inactive Code Status Order ID Comments User Context   06/27/2017 1810 06/28/2017 1640 Full Code [id number]  Velna Ochs, MD Inpatient   11/08/2012 1529 11/14/2012 1816 Full Code [id number]  Rexene Alberts, MD Inpatient   10/10/2012 1333 10/11/2012 1556 Full Code [id number]  Donita Brooks, NP ED      Home/SNF/Other Given to floor  Chief Complaint dental pain   Level of Care/Admitting Diagnosis ED Disposition    ED Disposition Condition [facility] Area: [facility] [903009]  Level of Care: Med-Surg [16]  Diagnosis: Cellulitis [233007]  Admitting Physician: MEMPHIS, CRESWELL [[id number]]  Attending Physician: Georgette Shell [[id number]]  PT Class (Do Not Modify): Observation [104]  PT Acc Code (Do Not Modify): Observation [10022]       Medical History Past Medical History:  Diagnosis Date  . Antiphospholipid antibody syndrome ([place])   . Arthritis    "all over; related to CREST" (06/27/2017)  . Chronic bronchitis ([place])    "constantly in the winter" (06/27/2017)  . 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 DVT (deep venous thrombosis) ([place]) 1992; 1995   RLE; 1995  . GERD (gastroesophageal reflux disease)   . Heart murmur   . Hemorrhagic pericardial effusion 11/07/2012  . Hypertension   . ITP (idiopathic thrombocytopenic purpura)   . MVP (mitral valve prolapse)   . Paroxysmal atrial fibrillation ([place]) 09/18/2011  . Pneumonia    "constantly in the winter" (06/27/2017)  . Raynaud's phenomenon   . 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 Thyroid goiter     Allergies Allergies  Allergen  Reactions  . Demerol   . Codeine Nausea And Vomiting  . Ultram [Tramadol Hcl] Other (See Comments)    Makes her hands shake.    IV Location/Drains/Wounds Patient Lines/Drains/Airways Status   Active Line/Drains/Airways    Name:   Placement date:   Placement time:   Site:   Days:   Peripheral IV 05/31/18 Left Wrist   05/31/18    1059    Wrist   less than 1   Incision 11/08/12 Chest Other (Comment)   11/08/12    1214     2030   Incision 11/08/12 Chest Left   11/08/12    1224     2030   Wound 10/10/12 Laceration Eye Right   10/10/12    1612    Eye   2059          Labs/Imaging Results for orders placed or performed during the hospital encounter of 05/31/18 (from the past 48 hour(s))  CBC with Differential/Platelet     Status: Abnormal   Collection Time: 05/31/18 11:08 AM  Result Value Ref Range   WBC 14.7 (H) 4.0 - 10.5 K/uL   RBC 5.08 3.87 - 5.11 MIL/uL   Hemoglobin 14.3 12.0 - 15.0 g/dL   HCT 45.7 36.0 - 46.0 %   MCV 90.0 80.0 - 100.0 fL   MCH 28.1 26.0 - 34.0 pg  MCHC 31.3 30.0 - 36.0 g/dL   RDW 16.2 (H) 11.5 - 15.5 %   Platelets 299 150 - 400 K/uL   nRBC 0.0 0.0 - 0.2 %   Neutrophils Relative % 75 %   Neutro Abs 11.0 (H) 1.7 - 7.7 K/uL   Lymphocytes Relative 13 %   Lymphs Abs 1.9 0.7 - 4.0 K/uL   Monocytes Relative 11 %   Monocytes Absolute 1.7 (H) 0.1 - 1.0 K/uL   Eosinophils Relative 0 %   Eosinophils Absolute 0.1 0.0 - 0.5 K/uL   Basophils Relative 1 %   Basophils Absolute 0.1 0.0 - 0.1 K/uL   RBC Morphology MORPHOLOGY UNREMARKABLE    Immature Granulocytes 0 %   Abs Immature Granulocytes 0.04 0.00 - 0.07 K/uL    Comment: Performed at [facility], [place] [address], [place], [place] [address]  Basic metabolic panel     Status: Abnormal   Collection Time: 05/31/18 11:08 AM  Result Value Ref Range   Sodium 140 135 - 145 mmol/L   Potassium 3.2 (L) 3.5 - 5.1 mmol/L   Chloride 105 98 - 111 mmol/L   CO2 26 22 - 32 mmol/L   Glucose, Bld 89 70 - 99  mg/dL   BUN 15 8 - 23 mg/dL   Creatinine, Ser 0.77 0.44 - 1.00 mg/dL   Calcium 9.3 8.9 - 10.3 mg/dL   GFR calc non Af Amer >60 >60 mL/min   GFR calc Af Amer >60 >60 mL/min    Comment: (NOTE) The eGFR has been calculated using the CKD EPI equation. This calculation has not been validated in all clinical situations. eGFR's persistently <60 mL/min signify possible Chronic Kidney Disease.    Anion gap 9 5 - 15    Comment: Performed at [facility], [place] [address] [place], [place] [address]  Protime-INR     Status: Abnormal   Collection Time: 05/31/18  3:32 PM  Result Value Ref Range   Prothrombin Time 24.6 (H) 11.4 - 15.2 seconds   INR 2.26     Comment: Performed at [facility], [place] [address], [place], [place] [address]   Ct Maxillofacial W Contrast  Result Date: 05/31/2018 CLINICAL DATA:  Dental disease on the left with swelling and redness . EXAM: CT MAXILLOFACIAL WITH CONTRAST TECHNIQUE: Multidetector CT imaging of the maxillofacial structures was performed with intravenous contrast. Multiplanar CT image reconstructions were also generated. CONTRAST:  58m OMNIPAQUE IOHEXOL 300 MG/ML  SOLN COMPARISON:  10/10/2012 FINDINGS: Osseous: Advanced periodontal disease affecting the left maxillary molars, tooth 13 and tooth 14. Tooth 14 shows periapical radicular cyst formation with erosion into the sinus and associated odontogenic sinus inflammatory disease. Adjacent gingival inflammation and left facial cellulitis. No sign of drainable soft tissue abscess. Orbits: Negative Sinuses: Odontogenic inflammation of the left maxillary sinus as noted above. Other sinuses clear. Soft tissues: Left facial cellulitis and left maxillary gingival inflammation as described above. No sign of drainable abscess. Multi septated cystic lesion of the left thyroid measuring up to 2.8 cm in diameter. This was visible on a CT scan of 2014 an appeared quite similar, therefore quite  likely benign. The entire thyroid was not evaluated. 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 Apical radicular cyst formation at tooth 14 with erosion into the maxillary sinus and resultant left maxillary sinus odontogenic inflammation. Inflammatory swelling of the left maxillary gingiva and left facial  cellulitis. Electronically Signed   By: Nelson Chimes M.D.   On: 05/31/2018 13:39    Pending Labs Unresulted Labs (From admission, onward)    Start     Ordered   05/31/18 1554  MRSA PCR Screening  Once,   R     05/31/18 1556   05/31/18 1547  HIV antibody (Routine Testing)  Once,   R     05/31/18 1547          Vitals/Pain Today's Vitals   05/31/18 0956 05/31/18 1425 05/31/18 1457 05/31/18 1558  BP:  (!) 96/57    Pulse:  60    Resp:  17    Temp:      SpO2:  100%    Weight:   72.6 kg   Height:   _0  (1.676 m)   PainSc: 3    3     Isolation Precautions No active isolations  Medications Medications  0.9 %  sodium chloride infusion (20 mL/hr Intravenous New Bag/Given 05/31/18 1104)  saccharomyces boulardii (FLORASTOR) capsule 250 mg (has no administration in time range)  metroNIDAZOLE (FLAGYL) IVPB 500 mg (has no administration in time range)  chlorhexidine (PERIDEX) 0.12 % solution 15 mL (has no administration in time range)  ceFAZolin (ANCEF) IVPB 2g/100 mL premix (has no administration in time range)  clindamycin (CLEOCIN) IVPB 600 mg (0 mg Intravenous Stopped 05/31/18 1140)  iohexol (OMNIPAQUE) 300 MG/ML solution 75 mL (75 mLs Intravenous Contrast Given 05/31/18 1314)  potassium chloride SA (K-DUR,KLOR-CON) CR tablet 40 mEq (40 mEq Oral Given 05/31/18 1406)    Mobility walks

## 2018-05-31 NOTE — ED Provider Notes (Signed)
[facility] DEPT Provider Note   CSN: [id number] Arrival date & time: 05/31/18  0945     History   Chief Complaint Chief Complaint  Patient presents with  . Oral Swelling    HPI Gesselle A Rister is a 62 y.o. female.  62 year old female presents with 24-hour history of left-sided facial swelling which is been progressively worse.  Had pain initially and left upper molar and then developed erythema and edema to the left maxilla with extension up to his inferior orbital region.  Patient denies any visual disturbance.  Denies any fever or chills.  No trouble swallowing.  No pain to her lower jaw.  Saw her dentist today who sent patient here for IV antibiotics.     Past Medical History:  Diagnosis Date  . Antiphospholipid antibody syndrome ([place])   . Arthritis    "all over; related to CREST" (06/27/2017)  . Chronic bronchitis ([place])    "constantly in the winter" (06/27/2017)  . 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 DVT (deep venous thrombosis) ([place]) 1992; 1995   RLE; 1995  . GERD (gastroesophageal reflux disease)   . Heart murmur   . Hemorrhagic pericardial effusion 11/07/2012  . Hypertension   . ITP (idiopathic thrombocytopenic purpura)   . MVP (mitral valve prolapse)   . Paroxysmal atrial fibrillation ([place]) 09/18/2011  . Pneumonia    "constantly in the winter" (06/27/2017)  . Raynaud's phenomenon   . 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 Thyroid goiter     Patient Active Problem List   Diagnosis Date Noted  . S/P splenectomy 10/22/2017  . Small intestinal bacterial overgrowth 10/22/2017  . Respiratory failure with hypoxia ([place]) 06/27/2017  . Atrial fibrillation with rapid ventricular response ([place]) 06/27/2017  . DOE (dyspnea on exertion) 08/21/2013  . Disturbance of skin sensation 05/29/2013  . Trigeminal nerve disorder, unspecified 05/29/2013  . Recurrent deep venous  thrombosis, unspecified laterality ([place]) 12/05/2012  . Paroxysmal atrial fibrillation ([place]) 11/14/2012  . Thyroid goiter 11/14/2012  . Bilateral pulmonary infiltrates on CXR 11/11/2012  . Bilateral pleural effusion 11/11/2012  . CREST syndrome ([place]) 11/08/2012  . 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 Hemorrhagic pericardial effusion 11/07/2012  . Fall 10/10/2012  . Antiphospholipid antibody positive 10/10/2012  . Chronic anticoagulation 10/10/2012  . Varicose veins of lower extremities with other complications 40/05/2724  . Post-phlebitic syndrome 09/02/2012  . MVP (mitral valve prolapse) 09/14/2011  . Pericardial effusion 09/14/2011  . HTN (hypertension) 09/14/2011  . Dyspnea 09/14/2011    Past Surgical History:  Procedure Laterality Date  . ENDOMETRIAL ABLATION  ~ 2007  . SPLENECTOMY  1995   ITP  . SQUAMOUS CELL CARCINOMA EXCISION Right 2018   "lower leg"  . SUBXYPHOID PERICARDIAL WINDOW N/A 11/08/2012   Procedure: SUBXYPHOID PERICARDIAL WINDOW;  Surgeon: Rexene Alberts, MD;  Location: [facility] OR;  Service: Thoracic;  Laterality: N/A;  . TUBAL LIGATION       OB History   None      Home Medications    Prior to Admission medications   Medication Sig Start Date End Date Taking? Authorizing Provider  acetaminophen (TYLENOL) 500 MG tablet Take 1,000 mg by mouth every 6 (six) hours as needed for mild pain, fever or headache.    [provider]  albuterol (PROVENTIL HFA;VENTOLIN HFA) 108 (90 Base) MCG/ACT inhaler Inhale 1-2 puffs into the lungs every 6 (  six) hours as needed. 09/11/16 09/11/17  [provider]  amoxicillin-clavulanate (AUGMENTIN) 875-125 MG tablet Take 1 tablet by mouth every 12 (twelve) hours. Patient not taking: Reported on 10/22/2017 06/28/17   Niel Hummer A, MD  Cholecalciferol (VITAMIN D3) 50000 units CAPS Take 50,000 capsules by mouth once a week.  06/25/17   [provider]  ciprofloxacin (CIPRO) 500 MG tablet Take  500 mg by mouth 2 (two) times daily. 10/17/17   [provider]  cycloSPORINE (RESTASIS) 0.05 % ophthalmic emulsion Place 1 drop into both eyes daily.    [provider]  esomeprazole (NEXIUM) 40 MG capsule Take 40 mg by mouth 2 (two) times daily.    [provider]  furosemide (LASIX) 40 MG tablet Take 1 tablet (40 mg total) by mouth daily. NEED OV. Patient taking differently: Take 80 mg by mouth daily. NEED OV. 03/18/15   Minus Breeding, MD  gabapentin (NEURONTIN) 400 MG capsule Take 1 capsule (400 mg total) by mouth 3 (three) times daily. 08/04/14   Penumalli, Earlean Polka, MD  ipratropium (ATROVENT) 0.06 % nasal spray Place 2 sprays into the nose 4 (four) times daily as needed for rhinitis.    [provider]  metoprolol succinate (TOPROL XL) 25 MG 24 hr tablet Take 0.5 tablets (12.5 mg total) by mouth daily. 06/28/17   Regalado, Belkys A, MD  mometasone (NASONEX) 50 MCG/ACT nasal spray Place 2 sprays into the nose daily as needed.     [provider]  NIFEdipine (PROCARDIA-XL/ADALAT-CC/NIFEDICAL-XL) 30 MG 24 hr tablet Take 30 mg by mouth daily. 06/13/17   [provider]  oseltamivir (TAMIFLU) 75 MG capsule Take 1 capsule (75 mg total) by mouth 2 (two) times daily. Patient not taking: Reported on 10/22/2017 06/28/17   Regalado, Jerald Kief A, MD  potassium chloride SA (K-DUR,KLOR-CON) 20 MEQ tablet TAKE 1 TABLET BY MOUTH DAILY Patient taking differently: TAKE 1 TABLET 10 MEQ  BY MOUTH DAILY 03/16/15   Minus Breeding, MD  progesterone (PROMETRIUM) 100 MG capsule Take 100 mg by mouth at bedtime.     [provider]  Risedronate Sodium (ACTONEL PO) Take 1 tablet by mouth every 30 (thirty) days.    [provider]  warfarin (COUMADIN) 5 MG tablet Take 1 tablet (5 mg total) by mouth daily. 06/29/17   Regalado, Cassie Freer, MD    Family History Family History  Problem Relation Age of Onset  . Cancer Mother        Lung  . Diabetes Father     . COPD Father   . Hyperlipidemia Father   . Hypertension Father     Social History Social History   Tobacco Use  . Smoking status: Never Smoker  . Smokeless tobacco: Never Used  Substance Use Topics  . Alcohol use: Yes    Alcohol/week: 6.0 standard drinks    Types: 6 Glasses of wine per week  . Drug use: No     Allergies   Demerol; Codeine; and Ultram [tramadol hcl]   Review of Systems Review of Systems  All other systems reviewed and are negative.    Physical Exam Updated Vital Signs BP (!) 106/93   Pulse 70   Temp 98.4 F (36.9 C)   Resp 17   SpO2 96%   Physical Exam  Constitutional: She is oriented to person, place, and time. She appears well-developed and well-nourished.  Non-toxic appearance. No distress.  HENT:  Head: Normocephalic and atraumatic.  Erythema and edema starts of  the left axilla and extends supraorbitally.  No crepitus appreciated  Eyes: Pupils are equal, round, and reactive to light. Conjunctivae, EOM and lids are normal.  Neck: Normal range of motion. Neck supple. No tracheal deviation present. No thyroid mass present.  Cardiovascular: Normal rate, regular rhythm and normal heart sounds. Exam reveals no gallop.  No murmur heard. Pulmonary/Chest: Effort normal and breath sounds normal. No stridor. No respiratory distress. She has no decreased breath sounds. She has no wheezes. She has no rhonchi. She has no rales.  Abdominal: Soft. Normal appearance and bowel sounds are normal. She exhibits no distension. There is no tenderness. There is no rebound and no CVA tenderness.  Musculoskeletal: Normal range of motion. She exhibits no edema or tenderness.  Neurological: She is alert and oriented to person, place, and time. She has normal strength. No cranial nerve deficit or sensory deficit. GCS eye subscore is 4. GCS verbal subscore is 5. GCS motor subscore is 6.  Skin: Skin is warm and dry. No abrasion and no rash noted.  Psychiatric: She has a  normal mood and affect. Her speech is normal and behavior is normal.  Nursing note and vitals reviewed.    ED Treatments / Results  Labs (all labs ordered are listed, but only abnormal results are displayed) Labs Reviewed - No data to display  EKG None  Radiology No results found.  Procedures Procedures (including critical care time)  Medications Ordered in ED Medications  clindamycin (CLEOCIN) IVPB 600 mg (has no administration in time range)  0.9 %  sodium chloride infusion (has no administration in time range)     Initial Impression / Assessment and Plan / ED Course  I have reviewed the triage vital signs and the nursing notes.  Pertinent labs & imaging results that were available during my care of the patient were reviewed by me and considered in my medical decision making (see chart for details).     Patient has leukocytosis and evidence of facial cellulitis on her CT.  Given IV clindamycin here.  Cellulitis seems to not be improving.  Will admit to the hospitalist for observation  Final Clinical Impressions(s) / ED Diagnoses   Final diagnoses:  None    ED Discharge Orders    None       Lacretia Leigh, MD 05/31/18 1428

## 2018-05-31 NOTE — ED Triage Notes (Signed)
Pt has bad tooth on left side and today had increased swelling and redness. Went to dentist who advised pt to Ed for IV antibiotics due to warm to touch.

## 2018-05-31 NOTE — Progress Notes (Signed)
[facility] for warfarin Indication: hx recurrent DVTs, Afib, Antiphospholipid antibody syndrome    Allergies  Allergen Reactions  . Demerol   . Codeine Nausea And Vomiting  . Ultram [Tramadol Hcl] Other (See Comments)    Makes her hands shake.    Patient Measurements: Height: 5\' 6"  (167.6 cm) Weight: 160 lb (72.6 kg) IBW/kg (Calculated) : 59.3 Heparin Dosing Weight:   Vital Signs: Temp: 97.8 F (36.6 C) (10/25 1632) Temp Source: Oral (10/25 1632) BP: 70/49 (10/25 1632) Pulse Rate: 64 (10/25 1632)  Labs: Recent Labs    05/31/18 1108 05/31/18 1532  HGB 14.3  --   HCT 45.7  --   PLT 299  --   LABPROT  --  24.6*  INR  --  2.26  CREATININE 0.77  --     Estimated Creatinine Clearance: 73.4 mL/min (by C-G formula based on SCr of 0.77 mg/dL).   Medications:  - on warfarin 7.5 mg daily except 5 mg on Wed, Sat, and Sun (last dose taken on 05/31/18) --> verified with patient  Assessment: Patient's a 63 y.o F with hx crest syndrome, lupus, antiphospholipid antibody syndrome, recurrent DVTs and afib on warfarin PTA, presented to the ED on 10/25 with c/o left facial swelling.  Warfarin resumed on admission.  Today, 05/31/2018: - INR is sub-therapeutic at 2.26 - cbc ok - scr 0.77 - Drug-drug intxns: being on abx (ancef) can make patient more sensitive to warfarin  Goal of Therapy:  INR 2.5-3.5 (per patient) Monitor platelets by anticoagulation protocol: Yes   Plan:  - no inpatient dose today since patient already took her dose this morning - f/u with INR on 10/26 and dose warfarin - monitor for s/s bleeding  Budd Freiermuth P 05/31/2018,4:40 PM

## 2018-05-31 NOTE — H&P (Signed)
History and Physical    [name] DGL:[id number] DOB: 1955/10/09 DOA: 05/31/2018  PCP: Chapman Moss, MD  Patient coming from: Home    Chief Complaint: Left facial swelling  HPI: [name] is a 62 y.o. female with medical history significant of crest syndrome, antiphospholipid antibody syndrome, lupus, history of splenectomy, admitted with complaints of left facial swelling and erythema started yesterday got worse today no associated fever chills difficulty swallowing but does she does have difficulty chewing.  She denies any chest pain shortness of breath cough nausea vomiting diarrhea.  No urinary complaints.  CT scan done in the ER shows advanced periodontal disease of the left maxillary molar worse at tooth 14 apical radicular cyst formation at tooth 14 with erosion into the maxillary sinus and left maxillary sinus or dental genic inflammation inflammatory swelling of the left maxillary gingiva and left facial cellulitis ED Course: Patient received clindamycin vital signs stable  Review of Systems: See HPI  Past Medical History:  Diagnosis Date  . Antiphospholipid antibody syndrome ([place])   . Arthritis    "all over; related to CREST" (06/27/2017)  . Chronic bronchitis ([place])    "constantly in the winter" (06/27/2017)  . 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 DVT (deep venous thrombosis) ([place]) 1992; 1995   RLE; 1995  . GERD (gastroesophageal reflux disease)   . Heart murmur   . Hemorrhagic pericardial effusion 11/07/2012  . Hypertension   . ITP (idiopathic thrombocytopenic purpura)   . MVP (mitral valve prolapse)   . Paroxysmal atrial fibrillation ([place]) 09/18/2011  . Pneumonia    "constantly in the winter" (06/27/2017)  . Raynaud's phenomenon   . 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 Thyroid goiter     Past Surgical History:  Procedure Laterality Date  . ENDOMETRIAL ABLATION  ~ 2007  .  SPLENECTOMY  1995   ITP  . SQUAMOUS CELL CARCINOMA EXCISION Right 2018   "lower leg"  . SUBXYPHOID PERICARDIAL WINDOW N/A 11/08/2012   Procedure: SUBXYPHOID PERICARDIAL WINDOW;  Surgeon: Rexene Alberts, MD;  Location: [facility] OR;  Service: Thoracic;  Laterality: N/A;  . TUBAL LIGATION       reports that she has never smoked. She has never used smokeless tobacco. She reports that she drinks about 6.0 standard drinks of alcohol per week. She reports that she does not use drugs.  Allergies  Allergen Reactions  . Demerol   . Codeine Nausea And Vomiting  . Ultram [Tramadol Hcl] Other (See Comments)    Makes her hands shake.    Family History  Problem Relation Age of Onset  . Cancer Mother        Lung  . Diabetes Father   . COPD Father   . Hyperlipidemia Father   . Hypertension Father      Prior to Admission medications   Medication Sig Start Date End Date Taking? Authorizing Provider  alendronate (FOSAMAX) 70 MG tablet Take 70 mg by mouth once a week. 03/05/18  Yes [provider]  ciprofloxacin (CIPRO) 500 MG tablet Take 500 mg by mouth 2 (two) times daily. 10/17/17  Yes [provider]  cycloSPORINE (RESTASIS) 0.05 % ophthalmic emulsion Place 1 drop into both eyes daily.   Yes [provider]  esomeprazole (NEXIUM) 40 MG capsule Take 40 mg by mouth 2 (two) times daily.   Yes [provider]  furosemide (LASIX)  40 MG tablet Take 1 tablet (40 mg total) by mouth daily. NEED OV. Patient taking differently: Take 80 mg by mouth daily. NEED OV. 03/18/15  Yes Minus Breeding, MD  gabapentin (NEURONTIN) 600 MG tablet Take 1,200 mg by mouth 2 (two) times daily. 04/02/18  Yes [provider]  ipratropium (ATROVENT) 0.06 % nasal spray Place 2 sprays into the nose 4 (four) times daily as needed for rhinitis.   Yes [provider]  metoprolol succinate (TOPROL XL) 25 MG 24 hr tablet Take 0.5 tablets (12.5 mg total) by mouth daily. Patient taking  differently: Take 25 mg by mouth 2 (two) times daily.  06/28/17  Yes Regalado, Belkys A, MD  mometasone (NASONEX) 50 MCG/ACT nasal spray Place 2 sprays into the nose daily as needed (seasonal allergies.).    Yes [provider]  NIFEdipine (PROCARDIA-XL/ADALAT-CC/NIFEDICAL-XL) 30 MG 24 hr tablet Take 30 mg by mouth daily. 06/13/17  Yes [provider]  potassium chloride SA (K-DUR,KLOR-CON) 20 MEQ tablet TAKE 1 TABLET BY MOUTH DAILY Patient taking differently: Take 20 mEq by mouth daily.  03/16/15  Yes Minus Breeding, MD  progesterone (PROMETRIUM) 100 MG capsule Take 100 mg by mouth at bedtime.    Yes [provider]  warfarin (COUMADIN) 5 MG tablet Take 1 tablet (5 mg total) by mouth daily. Patient taking differently: Take 5-7.5 mg by mouth See admin instructions. On  Wednesday, Saturday, and Sunday take 5 mg once daily.   On Monday, Tuesday, Thursday, and Friday take 7.5 mg once daily. 06/29/17  Yes Regalado, Belkys A, MD  albuterol (PROVENTIL HFA;VENTOLIN HFA) 108 (90 Base) MCG/ACT inhaler Inhale 1-2 puffs into the lungs every 6 (six) hours as needed. 09/11/16 09/11/17  [provider]  amoxicillin-clavulanate (AUGMENTIN) 875-125 MG tablet Take 1 tablet by mouth every 12 (twelve) hours. Patient not taking: Reported on 10/22/2017 06/28/17   Regalado, Jerald Kief A, MD  gabapentin (NEURONTIN) 400 MG capsule Take 1 capsule (400 mg total) by mouth 3 (three) times daily. Patient not taking: Reported on 05/31/2018 08/04/14   Penumalli, Earlean Polka, MD  oseltamivir (TAMIFLU) 75 MG capsule Take 1 capsule (75 mg total) by mouth 2 (two) times daily. Patient not taking: Reported on 10/22/2017 06/28/17   Elmarie Shiley, MD    Physical Exam: Vitals:   05/31/18 0951 05/31/18 1425 05/31/18 1457  BP: (!) 106/93 (!) 96/57   Pulse: 70 60   Resp: 17 17   Temp: 98.4 F (36.9 C)    SpO2: 96% 100%   Weight:   72.6 kg  Height:   5\' 6"  (1.676 m)    Constitutional: NAD, calm,  comfortable Vitals:   05/31/18 0951 05/31/18 1425 05/31/18 1457  BP: (!) 106/93 (!) 96/57   Pulse: 70 60   Resp: 17 17   Temp: 98.4 F (36.9 C)    SpO2: 96% 100%   Weight:   72.6 kg  Height:   5\' 6"  (1.676 m)   Eyes: PERRL, lids and conjunctivae normal ENMT-swollen left face erythema left face warm to touch Neck: normal, supple, no masses, no thyromegaly Respiratory: clear to auscultation bilaterally, no wheezing, no crackles. Normal respiratory effort. No accessory muscle use.  Cardiovascular: Regular rate and rhythm, no murmurs / rubs / gallops. No extremity edema. 2+ pedal pulses. No carotid bruits.  Abdomen: no tenderness, no masses palpated. No hepatosplenomegaly. Bowel sounds positive.  Musculoskeletal: no clubbing / cyanosis. No joint deformity upper and lower extremities. Good ROM, no contractures. Normal muscle  tone.  Skin: no rashes, lesions, ulcers. No induration Neurologic: CN 2-12 grossly intact. Sensation intact, DTR normal. Strength 5/5 in all 4.  Psychiatric: Normal judgment and insight. Alert and oriented x 3. Normal mood.   Labs on Admission: I have personally reviewed following labs and imaging studies  CBC: Recent Labs  Lab 05/31/18 1108  WBC 14.7*  NEUTROABS 11.0*  HGB 14.3  HCT 45.7  MCV 90.0  PLT 299   Basic Metabolic Panel: Recent Labs  Lab 05/31/18 1108  NA 140  K 3.2*  CL 105  CO2 26  GLUCOSE 89  BUN 15  CREATININE 0.77  CALCIUM 9.3   GFR: Estimated Creatinine Clearance: 73.4 mL/min (by C-G formula based on SCr of 0.77 mg/dL). Liver Function Tests: No results for input(s): AST, ALT, ALKPHOS, BILITOT, PROT, ALBUMIN in the last 168 hours. No results for input(s): LIPASE, AMYLASE in the last 168 hours. No results for input(s): AMMONIA in the last 168 hours. Coagulation Profile: No results for input(s): INR, PROTIME in the last 168 hours. Cardiac Enzymes: No results for input(s): CKTOTAL, CKMB, CKMBINDEX, TROPONINI in the last 168  hours. BNP (last 3 results) No results for input(s): PROBNP in the last 8760 hours. HbA1C: No results for input(s): HGBA1C in the last 72 hours. CBG: No results for input(s): GLUCAP in the last 168 hours. Lipid Profile: No results for input(s): CHOL, HDL, LDLCALC, TRIG, CHOLHDL, LDLDIRECT in the last 72 hours. Thyroid Function Tests: No results for input(s): TSH, T4TOTAL, FREET4, T3FREE, THYROIDAB in the last 72 hours. Anemia Panel: No results for input(s): VITAMINB12, FOLATE, FERRITIN, TIBC, IRON, RETICCTPCT in the last 72 hours. Urine analysis:    Component Value Date/Time   COLORURINE YELLOW 06/27/2017 1648   APPEARANCEUR HAZY (A) 06/27/2017 1648   LABSPEC 1.009 06/27/2017 1648   PHURINE 5.0 06/27/2017 1648   GLUCOSEU NEGATIVE 06/27/2017 1648   HGBUR SMALL (A) 06/27/2017 1648   BILIRUBINUR NEGATIVE 06/27/2017 1648   KETONESUR NEGATIVE 06/27/2017 1648   PROTEINUR NEGATIVE 06/27/2017 1648   UROBILINOGEN 0.2 11/30/2012 1230   NITRITE NEGATIVE 06/27/2017 1648   LEUKOCYTESUR SMALL (A) 06/27/2017 1648    Radiological Exams on Admission: Ct Maxillofacial W Contrast  Result Date: 05/31/2018 CLINICAL DATA:  Dental disease on the left with swelling and redness . EXAM: CT MAXILLOFACIAL WITH CONTRAST TECHNIQUE: Multidetector CT imaging of the maxillofacial structures was performed with intravenous contrast. Multiplanar CT image reconstructions were also generated. CONTRAST:  94mL OMNIPAQUE IOHEXOL 300 MG/ML  SOLN COMPARISON:  10/10/2012 FINDINGS: Osseous: Advanced periodontal disease affecting the left maxillary molars, tooth 13 and tooth 14. Tooth 14 shows periapical radicular cyst formation with erosion into the sinus and associated odontogenic sinus inflammatory disease. Adjacent gingival inflammation and left facial cellulitis. No sign of drainable soft tissue abscess. Orbits: Negative Sinuses: Odontogenic inflammation of the left maxillary sinus as noted above. Other sinuses clear. Soft  tissues: Left facial cellulitis and left maxillary gingival inflammation as described above. No sign of drainable abscess. Multi septated cystic lesion of the left thyroid measuring up to 2.8 cm in diameter. This was visible on a CT scan of 2014 an appeared quite similar, therefore quite likely benign. The entire thyroid was not evaluated. 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 Apical radicular cyst formation at tooth 14 with erosion into the maxillary sinus and resultant left maxillary sinus odontogenic inflammation. Inflammatory swelling of the left maxillary gingiva and left facial  cellulitis. Electronically Signed   By: Nelson Chimes M.D.   On: 05/31/2018 13:39    Assessment/Plan Active Problems:   Cellulitis #1 left facial cellulitis-patient admitted with left facial swelling and pain started yesterday.  CT scan does not show any evidence of abscess but severe periodontal disease noted.  Patient follows up with dentist regularly.  She received clindamycin in the ER and I will start her on Ancef and Flagyl.  Chlorhexidine oral rinse.  She has leukocytosis she is afebrile.  #2 hypokalemia 3.2 replete and recheck.  #3 history of DVT/raynauds/antiphospholipid antibody syndrome check INR today and continue Coumadin.  #4 hypertension continue home medications. DVT prophylaxis Lovenox Code Status: Full code Family Communication discussed with husband was in the room Disposition Plan: Pending clinical improvement Consults called: None Admission status: Observation Georgette Shell MD [facility]   If 7PM-7AM, please contact night-coverage [email or web address] Password [facility]  05/31/2018, 3:58 PM

## 2018-06-01 DIAGNOSIS — R011 Cardiac murmur, unspecified: Secondary | ICD-10-CM | POA: Diagnosis present

## 2018-06-01 DIAGNOSIS — M341 CR(E)ST syndrome: Secondary | ICD-10-CM | POA: Diagnosis present

## 2018-06-01 DIAGNOSIS — Z7901 Long term (current) use of anticoagulants: Secondary | ICD-10-CM | POA: Diagnosis not present

## 2018-06-01 DIAGNOSIS — L03211 Cellulitis of face: Secondary | ICD-10-CM | POA: Diagnosis not present

## 2018-06-01 DIAGNOSIS — D72829 Elevated white blood cell count, unspecified: Secondary | ICD-10-CM

## 2018-06-01 DIAGNOSIS — I480 Paroxysmal atrial fibrillation: Secondary | ICD-10-CM | POA: Diagnosis present

## 2018-06-01 DIAGNOSIS — R229 Localized swelling, mass and lump, unspecified: Secondary | ICD-10-CM | POA: Diagnosis present

## 2018-06-01 DIAGNOSIS — Z8701 Personal history of pneumonia (recurrent): Secondary | ICD-10-CM | POA: Diagnosis not present

## 2018-06-01 DIAGNOSIS — D6861 Antiphospholipid syndrome: Secondary | ICD-10-CM | POA: Diagnosis present

## 2018-06-01 DIAGNOSIS — E876 Hypokalemia: Secondary | ICD-10-CM | POA: Diagnosis present

## 2018-06-01 DIAGNOSIS — Z8249 Family history of ischemic heart disease and other diseases of the circulatory system: Secondary | ICD-10-CM | POA: Diagnosis not present

## 2018-06-01 DIAGNOSIS — K05219 Aggressive periodontitis, localized, unspecified severity: Secondary | ICD-10-CM | POA: Diagnosis not present

## 2018-06-01 DIAGNOSIS — Z7983 Long term (current) use of bisphosphonates: Secondary | ICD-10-CM | POA: Diagnosis not present

## 2018-06-01 DIAGNOSIS — Z9081 Acquired absence of spleen: Secondary | ICD-10-CM | POA: Diagnosis not present

## 2018-06-01 DIAGNOSIS — Z885 Allergy status to narcotic agent status: Secondary | ICD-10-CM | POA: Diagnosis not present

## 2018-06-01 DIAGNOSIS — I341 Nonrheumatic mitral (valve) prolapse: Secondary | ICD-10-CM | POA: Diagnosis present

## 2018-06-01 DIAGNOSIS — J42 Unspecified chronic bronchitis: Secondary | ICD-10-CM | POA: Diagnosis present

## 2018-06-01 DIAGNOSIS — M1990 Unspecified osteoarthritis, unspecified site: Secondary | ICD-10-CM | POA: Diagnosis present

## 2018-06-01 DIAGNOSIS — Z86718 Personal history of other venous thrombosis and embolism: Secondary | ICD-10-CM | POA: Diagnosis not present

## 2018-06-01 DIAGNOSIS — Z79890 Hormone replacement therapy (postmenopausal): Secondary | ICD-10-CM | POA: Diagnosis not present

## 2018-06-01 DIAGNOSIS — K219 Gastro-esophageal reflux disease without esophagitis: Secondary | ICD-10-CM | POA: Diagnosis present

## 2018-06-01 DIAGNOSIS — Z79899 Other long term (current) drug therapy: Secondary | ICD-10-CM | POA: Diagnosis not present

## 2018-06-01 DIAGNOSIS — I10 Essential (primary) hypertension: Secondary | ICD-10-CM | POA: Diagnosis present

## 2018-06-01 DIAGNOSIS — D693 Immune thrombocytopenic purpura: Secondary | ICD-10-CM | POA: Diagnosis present

## 2018-06-01 LAB — BASIC METABOLIC PANEL
Anion gap: 5 (ref 5–15)
BUN: 15 mg/dL (ref 8–23)
CO2: 24 mmol/L (ref 22–32)
CREATININE: 0.7 mg/dL (ref 0.44–1.00)
Calcium: 9 mg/dL (ref 8.9–10.3)
Chloride: 107 mmol/L (ref 98–111)
Glucose, Bld: 86 mg/dL (ref 70–99)
Potassium: 3.7 mmol/L (ref 3.5–5.1)
SODIUM: 136 mmol/L (ref 135–145)

## 2018-06-01 LAB — CBC
HCT: 39.7 % (ref 36.0–46.0)
Hemoglobin: 12.4 g/dL (ref 12.0–15.0)
MCH: 28.5 pg (ref 26.0–34.0)
MCHC: 31.2 g/dL (ref 30.0–36.0)
MCV: 91.3 fL (ref 80.0–100.0)
Platelets: 275 10*3/uL (ref 150–400)
RBC: 4.35 MIL/uL (ref 3.87–5.11)
RDW: 16.6 % — ABNORMAL HIGH (ref 11.5–15.5)
WBC: 13.2 10*3/uL — AB (ref 4.0–10.5)
nRBC: 0 % (ref 0.0–0.2)

## 2018-06-01 LAB — PROTIME-INR
INR: 2.72
PROTHROMBIN TIME: 28.4 s — AB (ref 11.4–15.2)

## 2018-06-01 LAB — HIV ANTIBODY (ROUTINE TESTING W REFLEX): HIV SCREEN 4TH GENERATION: NONREACTIVE

## 2018-06-01 MED ORDER — PANTOPRAZOLE SODIUM 40 MG PO TBEC
40.0000 mg | DELAYED_RELEASE_TABLET | Freq: Two times a day (BID) | ORAL | Status: DC
  Filled 2018-06-01: qty 1

## 2018-06-01 MED ORDER — ACETAMINOPHEN 325 MG PO TABS
650.0000 mg | ORAL_TABLET | Freq: Four times a day (QID) | ORAL | Status: DC | PRN
  Filled 2018-06-01: qty 2

## 2018-06-01 MED ORDER — WARFARIN - PHARMACIST DOSING INPATIENT: Freq: Every day | Status: DC

## 2018-06-01 MED ORDER — FLUTICASONE PROPIONATE 50 MCG/ACT NA SUSP
2.0000 | Freq: Every day | NASAL | Status: DC
  Administered 2018-06-01 – 2018-06-02 (×2): 2 via NASAL
  Filled 2018-06-01: qty 16

## 2018-06-01 MED ORDER — PROGESTERONE MICRONIZED 100 MG PO CAPS
100.0000 mg | ORAL_CAPSULE | Freq: Every day | ORAL | Status: DC
  Administered 2018-06-01: 100 mg via ORAL
  Filled 2018-06-01 (×2): qty 1

## 2018-06-01 MED ORDER — IPRATROPIUM BROMIDE 0.06 % NA SOLN: 2.0000 | Freq: Four times a day (QID) | NASAL | Status: DC | PRN

## 2018-06-01 MED ORDER — WARFARIN SODIUM 5 MG PO TABS
5.0000 mg | ORAL_TABLET | Freq: Once | ORAL | Status: AC
  Administered 2018-06-01: 5 mg via ORAL
  Filled 2018-06-01: qty 1

## 2018-06-01 MED ORDER — PROGESTERONE MICRONIZED 100 MG PO CAPS: 100.0000 mg | ORAL_CAPSULE | Freq: Every day | ORAL | Status: DC

## 2018-06-01 MED ORDER — GABAPENTIN 600 MG PO TABS: 1200.0000 mg | ORAL_TABLET | Freq: Two times a day (BID) | ORAL | Status: DC

## 2018-06-01 MED ORDER — CYCLOSPORINE 0.05 % OP EMUL
1.0000 [drp] | Freq: Every day | OPHTHALMIC | Status: DC
  Administered 2018-06-01 – 2018-06-02 (×2): 1 [drp] via OPHTHALMIC
  Filled 2018-06-01 (×2): qty 1

## 2018-06-01 MED ORDER — PANTOPRAZOLE SODIUM 40 MG PO TBEC: 40.0000 mg | DELAYED_RELEASE_TABLET | Freq: Every day | ORAL | Status: DC

## 2018-06-01 MED ORDER — CLINDAMYCIN PHOSPHATE 600 MG/50ML IV SOLN
600.0000 mg | Freq: Three times a day (TID) | INTRAVENOUS | Status: DC
  Administered 2018-06-01 – 2018-06-02 (×3): 600 mg via INTRAVENOUS
  Filled 2018-06-01 (×4): qty 50

## 2018-06-01 MED ORDER — GABAPENTIN 400 MG PO CAPS
1200.0000 mg | ORAL_CAPSULE | Freq: Two times a day (BID) | ORAL | Status: DC
  Administered 2018-06-01 – 2018-06-02 (×3): 1200 mg via ORAL
  Filled 2018-06-01: qty 12
  Filled 2018-06-01 (×2): qty 3

## 2018-06-01 NOTE — Progress Notes (Signed)
[facility] PROGRESS NOTE    Progress Note  [name]  DXA:[id number] DOB: [birth year] DOA: 05/31/2018 PCP: Chapman Moss, MD     Brief Narrative:   [name] is an 62 y.o. female past medical history of crest syndrome, antiphospholipid syndrome lupus splenectomy admitted with facial swelling and cellulitis.  Assessment/Plan:   Facial left-sided Cellulitis/left periodontal abscess CT scan did not show abscess but it did show severe periodontal disease. Discontinue IV Flagyl and Ancef start IV clindamycin. Her swelling is not improved she continues to have discomfort.  Leukocytosis: Likely due to infectious etiology.  Persistent leukocytosis we will repeat a CBC in the morning.  Hypokalemia: He was repleted orally, now improved.  History of DVT/[place]/antiphospholipid syndrome: Continue Coumadin INR per pharmacy goal INR 2.5-3.5.   DVT prophylaxis: coumadin  Family Communication:none Disposition Plan/Barrier to D/C: home in 1-2 days Code Status:     Code Status Orders  (From admission, onward)         Start     Ordered   05/31/18 1548  Full code  Continuous     05/31/18 1547        Code Status History    Date Active Date Inactive Code Status Order ID Comments User Context   06/27/2017 1810 06/28/2017 1640 Full Code [id number]  Velna Ochs, MD Inpatient   11/08/2012 1529 11/14/2012 1816 Full Code [id number]  Rexene Alberts, MD Inpatient   10/10/2012 1333 10/11/2012 1556 Full Code [id number]  Donita Brooks, NP ED        IV Access:    Peripheral IV   Procedures and diagnostic studies:   Ct Maxillofacial W Contrast  Result Date: 05/31/2018 CLINICAL DATA:  Dental disease on the left with swelling and redness . EXAM: CT MAXILLOFACIAL WITH CONTRAST TECHNIQUE: Multidetector CT imaging of the maxillofacial structures was performed with intravenous contrast. Multiplanar CT image reconstructions were also generated. CONTRAST:  52mL OMNIPAQUE  IOHEXOL 300 MG/ML  SOLN COMPARISON:  10/10/2012 FINDINGS: Osseous: Advanced periodontal disease affecting the left maxillary molars, tooth 13 and tooth 14. Tooth 14 shows periapical radicular cyst formation with erosion into the sinus and associated odontogenic sinus inflammatory disease. Adjacent gingival inflammation and left facial cellulitis. No sign of drainable soft tissue abscess. Orbits: Negative Sinuses: Odontogenic inflammation of the left maxillary sinus as noted above. Other sinuses clear. Soft tissues: Left facial cellulitis and left maxillary gingival inflammation as described above. No sign of drainable abscess. Multi septated cystic lesion of the left thyroid measuring up to 2.8 cm in diameter. This was visible on a CT scan of 2014 an appeared quite similar, therefore quite likely benign. The entire thyroid was not evaluated. 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 Apical radicular cyst formation at tooth 14 with erosion into the maxillary sinus and resultant left maxillary sinus odontogenic inflammation. Inflammatory swelling of the left maxillary gingiva and left facial cellulitis. Electronically Signed   By: Nelson Chimes M.D.   On: 05/31/2018 13:39     Medical Consultants:    None.  Anti-Infectives:   IV clindamycin.  Subjective:    [name] she relates is still painful swelling slightly improved.    Objective:    Vitals:   05/31/18 1425 05/31/18 1457 05/31/18 1632 05/31/18 2106  BP: (!) 96/57  (!) 70/49 (!) 117/56  Pulse: 60  64 67  Resp: 17  17 17   Temp:  97.8 F (36.6 C) 98.7 F (37.1 C)  TempSrc:   Oral Oral  SpO2: 100%  99% 97%  Weight:  72.6 kg    Height:  5\' 6"  (1.676 m)      Intake/Output Summary (Last 24 hours) at 06/01/2018 0934 Last data filed at 06/01/2018 0900 Gross per 24 hour  Intake 1957.78 ml  Output 1800 ml  Net 157.78 ml   Filed Weights    05/31/18 1457  Weight: 72.6 kg    Exam: General exam: In no acute distress. Respiratory system: Good air movement and clear to auscultation. Cardiovascular system: S1 & S2 heard, RRR.  Gastrointestinal system: Abdomen is nondistended, soft and nontender.  Central nervous system: Alert and oriented. No focal neurological deficits. Extremities: No pedal edema. Skin: The left side of her face is significantly swollen and erythematous tender to touch. Psychiatry: Judgement and insight appear normal. Mood & affect appropriate.    Data Reviewed:    Labs: Basic Metabolic Panel: Recent Labs  Lab 05/31/18 1108 06/01/18 0400  NA 140 136  K 3.2* 3.7  CL 105 107  CO2 26 24  GLUCOSE 89 86  BUN 15 15  CREATININE 0.77 0.70  CALCIUM 9.3 9.0   GFR Estimated Creatinine Clearance: 73.4 mL/min (by C-G formula based on SCr of 0.7 mg/dL). Liver Function Tests: No results for input(s): AST, ALT, ALKPHOS, BILITOT, PROT, ALBUMIN in the last 168 hours. No results for input(s): LIPASE, AMYLASE in the last 168 hours. No results for input(s): AMMONIA in the last 168 hours. Coagulation profile Recent Labs  Lab 05/31/18 1532 06/01/18 0400  INR 2.26 2.72    CBC: Recent Labs  Lab 05/31/18 1108 06/01/18 0400  WBC 14.7* 13.2*  NEUTROABS 11.0*  --   HGB 14.3 12.4  HCT 45.7 39.7  MCV 90.0 91.3  PLT 299 275   Cardiac Enzymes: No results for input(s): CKTOTAL, CKMB, CKMBINDEX, TROPONINI in the last 168 hours. BNP (last 3 results) No results for input(s): PROBNP in the last 8760 hours. CBG: No results for input(s): GLUCAP in the last 168 hours. D-Dimer: No results for input(s): DDIMER in the last 72 hours. Hgb A1c: No results for input(s): HGBA1C in the last 72 hours. Lipid Profile: No results for input(s): CHOL, HDL, LDLCALC, TRIG, CHOLHDL, LDLDIRECT in the last 72 hours. Thyroid function studies: No results for input(s): TSH, T4TOTAL, T3FREE, THYROIDAB in the last 72  hours.  Invalid input(s): FREET3 Anemia work up: No results for input(s): VITAMINB12, FOLATE, FERRITIN, TIBC, IRON, RETICCTPCT in the last 72 hours. Sepsis Labs: Recent Labs  Lab 05/31/18 1108 06/01/18 0400  WBC 14.7* 13.2*   Microbiology No results found for this or any previous visit (from the past 240 hour(s)).   Medications:   . chlorhexidine  15 mL Mouth/Throat QID  . metoprolol succinate  12.5 mg Oral Daily  . saccharomyces boulardii  250 mg Oral BID   Continuous Infusions: . sodium chloride Stopped (05/31/18 1810)  .  ceFAZolin (ANCEF) IV 2 g (06/01/18 0750)  . metronidazole 500 mg (06/01/18 0746)      LOS: 0 days   [name]  [facility] Pager [phone]  *Please refer to [email or web address], password [email or web address] to get updated schedule on who will round on this patient, as hospitalists switch teams weekly. If 7PM-7AM, please contact night-coverage at [email or web address], password [email or web address] for any overnight needs.  06/01/2018, 9:34 AM

## 2018-06-01 NOTE — Progress Notes (Signed)
ANTICOAGULATION CONSULT NOTE - Follow Up Consult  Pharmacy Consult for Warfarin Indication: hx recurrent DVTs, Afib, Antiphospholipid antibody syndrome   Allergies  Allergen Reactions  . Demerol   . Codeine Nausea And Vomiting  . Ultram [Tramadol Hcl] Other (See Comments)    Makes her hands shake.    Patient Measurements: Height: 5\' 6"  (167.6 cm) Weight: 160 lb (72.6 kg) IBW/kg (Calculated) : 59.3  Vital Signs:    Labs: Recent Labs    05/31/18 1108 05/31/18 1532 06/01/18 0400  HGB 14.3  --  12.4  HCT 45.7  --  39.7  PLT 299  --  275  LABPROT  --  24.6* 28.4*  INR  --  2.26 2.72  CREATININE 0.77  --  0.70    Estimated Creatinine Clearance: 73.4 mL/min (by C-G formula based on SCr of 0.7 mg/dL).   Medications:  Scheduled:  . chlorhexidine  15 mL Mouth/Throat QID  . metoprolol succinate  12.5 mg Oral Daily  . saccharomyces boulardii  250 mg Oral BID   Infusions:  . sodium chloride Stopped (05/31/18 1810)  . clindamycin (CLEOCIN) IV      Assessment: 80 yoF admitted on 10/25 with left facial swelling.  PMH includes chronic warfarin anticoagulation for hx crest syndrome, lupus, antiphospholipid antibody syndrome, recurrent DVTs and afib.  PTA Warfarin 7.5 mg daily except 5 mg on Wed, Sat, and Sun (last dose taken on 05/31/18) --> verified with patient Admission INR 2.26  Today, 06/01/2018:  INR 2.72, therapeutic  CBC: Hgb decreased to 12.4, Plt WNL  No bleeding or complications reported  No major drug-drug interactions noted.  Antibiotics may affect GI flora and vitamin K production.  Goal of Therapy:  INR 2-3 Monitor platelets by anticoagulation protocol: Yes   Plan:  Warfarin 5 mg PO x 1 at 1800 per home dosing regimen. Daily PT/INR. Monitor for signs and symptoms of bleeding.   Gretta Arab PharmD, BCPS Pager [phone] 06/01/2018 12:12 PM

## 2018-06-02 LAB — CBC
HCT: 39.2 % (ref 36.0–46.0)
Hemoglobin: 12.3 g/dL (ref 12.0–15.0)
MCH: 28.3 pg (ref 26.0–34.0)
MCHC: 31.4 g/dL (ref 30.0–36.0)
MCV: 90.3 fL (ref 80.0–100.0)
PLATELETS: 278 10*3/uL (ref 150–400)
RBC: 4.34 MIL/uL (ref 3.87–5.11)
RDW: 16.6 % — AB (ref 11.5–15.5)
WBC: 11.4 10*3/uL — AB (ref 4.0–10.5)
nRBC: 0 % (ref 0.0–0.2)

## 2018-06-02 LAB — PROTIME-INR
INR: 2.39
PROTHROMBIN TIME: 25.8 s — AB (ref 11.4–15.2)

## 2018-06-02 MED ORDER — ENOXAPARIN SODIUM 80 MG/0.8ML ~~LOC~~ SOLN: 1.0000 mg/kg | Freq: Two times a day (BID) | SUBCUTANEOUS | 0 refills | Status: AC

## 2018-06-02 MED ORDER — CLINDAMYCIN HCL 300 MG PO CAPS: 600.0000 mg | ORAL_CAPSULE | Freq: Three times a day (TID) | ORAL | 0 refills | Status: AC

## 2018-06-02 MED ORDER — ENOXAPARIN SODIUM 80 MG/0.8ML ~~LOC~~ SOLN
1.0000 mg/kg | Freq: Two times a day (BID) | SUBCUTANEOUS | Status: DC
  Administered 2018-06-02: 75 mg via SUBCUTANEOUS
  Filled 2018-06-02: qty 0.8

## 2018-06-02 NOTE — Progress Notes (Signed)
Pt to d/c home with no needs. Pt  Stable at time of d/c.

## 2018-06-02 NOTE — Plan of Care (Signed)
Pt stable this am with no needs. Pt to d/c home when rn is ready with paperwork. Pt was able to walk in hall and room with no needs. Will continue to monitor.

## 2018-06-02 NOTE — Discharge Summary (Signed)
Physician Discharge Summary  [name] WFU:[id number] DOB: Oct 18, 1955 DOA: 05/31/2018  PCP: Chapman Moss, MD  Admit date: 05/31/2018 Discharge date: 06/02/2018  Admitted From: Home Disposition:  Home  Recommendations for Outpatient Follow-up:  1. Follow up with Dentist  in 1-2 weeks   Home Health:No Equipment/Devices:none  Discharge Condition:stable CODE STATUS:Full Diet recommendation: Heart Healthy    Brief/Interim Summary:  62 y.o. female past medical history of crest syndrome, antiphospholipid syndrome lupus splenectomy admitted with facial swelling and cellulitis.  Discharge Diagnoses:  Active Problems:   Cellulitis   Facial cellulitis   Hypokalemia   Leukocytosis   Acute periodontal abscess  Left facial swelling question cellulitis/left periodontal abscess: CT scan of the maxillofacial was done that shows severe periodontal disease. She was started on IV clindamycin she defervesced her pain improved her leukocytosis also improved. She was able to tolerate her diet. Her Coumadin was stopped she was placed on Lovenox twice a day she will follow-up with her dentist in 2 days, she will go on clindamycin 3 times a day.  Leukocytosis: Likely due to infectious etiology.  Hypokalemia: It was repleted orally.  History of DVT and antiphospholipid syndrome: Her Coumadin was held her INR was trending down on the day of discharge 2.3 she was sent home on Lovenox 70 mg twice daily until she sees her dentist.  Discharge Instructions  Discharge Instructions    Diet - low sodium heart healthy   Complete by:  As directed    Increase activity slowly   Complete by:  As directed      Allergies as of 06/02/2018      Reactions   Demerol    Codeine Nausea And Vomiting   Ultram [tramadol Hcl] Other (See Comments)   Makes her hands shake.      Medication List    STOP taking these medications   warfarin 5 MG tablet Commonly known as:  COUMADIN     TAKE these  medications   albuterol 108 (90 Base) MCG/ACT inhaler Commonly known as:  PROVENTIL HFA;VENTOLIN HFA Inhale 1-2 puffs into the lungs every 6 (six) hours as needed.   alendronate 70 MG tablet Commonly known as:  FOSAMAX Take 70 mg by mouth once a week.   ciprofloxacin 500 MG tablet Commonly known as:  CIPRO Take 500 mg by mouth 2 (two) times daily.   clindamycin 300 MG capsule Commonly known as:  CLEOCIN Take 2 capsules (600 mg total) by mouth 3 (three) times daily for 10 days.   enoxaparin 80 MG/0.8ML injection Commonly known as:  LOVENOX Inject 0.75 mLs (75 mg total) into the skin 2 (two) times daily.   esomeprazole 40 MG capsule Commonly known as:  NEXIUM Take 40 mg by mouth 2 (two) times daily.   furosemide 40 MG tablet Commonly known as:  LASIX Take 1 tablet (40 mg total) by mouth daily. NEED OV. What changed:  how much to take   gabapentin 600 MG tablet Commonly known as:  NEURONTIN Take 1,200 mg by mouth 2 (two) times daily.   ipratropium 0.06 % nasal spray Commonly known as:  ATROVENT Place 2 sprays into the nose 4 (four) times daily as needed for rhinitis.   metoprolol succinate 25 MG 24 hr tablet Commonly known as:  TOPROL-XL Take 0.5 tablets (12.5 mg total) by mouth daily. What changed:    how much to take  when to take this   mometasone 50 MCG/ACT nasal spray Commonly known as:  [place]  2 sprays into the nose daily as needed (seasonal allergies.).   NIFEdipine 30 MG 24 hr tablet Commonly known as:  PROCARDIA-XL/NIFEDICAL-XL Take 30 mg by mouth daily.   potassium chloride SA 20 MEQ tablet Commonly known as:  K-DUR,KLOR-CON TAKE 1 TABLET BY MOUTH DAILY   progesterone 100 MG capsule Commonly known as:  PROMETRIUM Take 100 mg by mouth at bedtime.   RESTASIS 0.05 % ophthalmic emulsion Generic drug:  cycloSPORINE Place 1 drop into both eyes daily.       Allergies  Allergen Reactions  . Demerol   . Codeine Nausea And Vomiting  .  Ultram [Tramadol Hcl] Other (See Comments)    Makes her hands shake.    Consultations:  None   Procedures/Studies: Ct Maxillofacial W Contrast  Result Date: 05/31/2018 CLINICAL DATA:  Dental disease on the left with swelling and redness . EXAM: CT MAXILLOFACIAL WITH CONTRAST TECHNIQUE: Multidetector CT imaging of the maxillofacial structures was performed with intravenous contrast. Multiplanar CT image reconstructions were also generated. CONTRAST:  27mL OMNIPAQUE IOHEXOL 300 MG/ML  SOLN COMPARISON:  10/10/2012 FINDINGS: Osseous: Advanced periodontal disease affecting the left maxillary molars, tooth 13 and tooth 14. Tooth 14 shows periapical radicular cyst formation with erosion into the sinus and associated odontogenic sinus inflammatory disease. Adjacent gingival inflammation and left facial cellulitis. No sign of drainable soft tissue abscess. Orbits: Negative Sinuses: Odontogenic inflammation of the left maxillary sinus as noted above. Other sinuses clear. Soft tissues: Left facial cellulitis and left maxillary gingival inflammation as described above. No sign of drainable abscess. Multi septated cystic lesion of the left thyroid measuring up to 2.8 cm in diameter. This was visible on a CT scan of 2014 an appeared quite similar, therefore quite likely benign. The entire thyroid was not evaluated. 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 Apical radicular cyst formation at tooth 14 with erosion into the maxillary sinus and resultant left maxillary sinus odontogenic inflammation. Inflammatory swelling of the left maxillary gingiva and left facial cellulitis. Electronically Signed   By: Nelson Chimes M.D.   On: 05/31/2018 13:39      Subjective: No complains  Discharge Exam: Vitals:   06/01/18 2136 06/02/18 0355  BP: 118/71 119/64  Pulse: 61 70  Resp: 15 14  Temp: 98.8 F (37.1 C) 98.4 F (36.9  C)  SpO2: 94% 95%   Vitals:   05/31/18 2106 06/01/18 1353 06/01/18 2136 06/02/18 0355  BP: (!) 117/56 131/61 118/71 119/64  Pulse: 67 69 61 70  Resp: 17 16 15 14   Temp: 98.7 F (37.1 C) 99.5 F (37.5 C) 98.8 F (37.1 C) 98.4 F (36.9 C)  TempSrc: Oral Oral Oral Oral  SpO2: 97% 96% 94% 95%  Weight:      Height:        General: Pt is alert, awake, not in acute distress Cardiovascular: RRR, S1/S2 +, no rubs, no gallops Respiratory: CTA bilaterally, no wheezing, no rhonchi Abdominal: Soft, NT, ND, bowel sounds + Extremities: no edema, no cyanosis    The results of significant diagnostics from this hospitalization (including imaging, microbiology, ancillary and laboratory) are listed below for reference.     Microbiology: No results found for this or any previous visit (from the past 240 hour(s)).   Labs: BNP (last 3 results) Recent Labs    06/27/17 0909  BNP 035.5*   Basic Metabolic Panel: Recent Labs  Lab 05/31/18 1108 06/01/18 0400  NA 140 136  K 3.2* 3.7  CL 105 107  CO2 26 24  GLUCOSE 89 86  BUN 15 15  CREATININE 0.77 0.70  CALCIUM 9.3 9.0   Liver Function Tests: No results for input(s): AST, ALT, ALKPHOS, BILITOT, PROT, ALBUMIN in the last 168 hours. No results for input(s): LIPASE, AMYLASE in the last 168 hours. No results for input(s): AMMONIA in the last 168 hours. CBC: Recent Labs  Lab 05/31/18 1108 06/01/18 0400 06/02/18 0354  WBC 14.7* 13.2* 11.4*  NEUTROABS 11.0*  --   --   HGB 14.3 12.4 12.3  HCT 45.7 39.7 39.2  MCV 90.0 91.3 90.3  PLT 299 275 278   Cardiac Enzymes: No results for input(s): CKTOTAL, CKMB, CKMBINDEX, TROPONINI in the last 168 hours. BNP: Invalid input(s): POCBNP CBG: No results for input(s): GLUCAP in the last 168 hours. D-Dimer No results for input(s): DDIMER in the last 72 hours. Hgb A1c No results for input(s): HGBA1C in the last 72 hours. Lipid Profile No results for input(s): CHOL, HDL, LDLCALC, TRIG,  CHOLHDL, LDLDIRECT in the last 72 hours. Thyroid function studies No results for input(s): TSH, T4TOTAL, T3FREE, THYROIDAB in the last 72 hours.  Invalid input(s): FREET3 Anemia work up No results for input(s): VITAMINB12, FOLATE, FERRITIN, TIBC, IRON, RETICCTPCT in the last 72 hours. Urinalysis    Component Value Date/Time   COLORURINE YELLOW 06/27/2017 1648   APPEARANCEUR HAZY (A) 06/27/2017 1648   LABSPEC 1.009 06/27/2017 1648   PHURINE 5.0 06/27/2017 1648   GLUCOSEU NEGATIVE 06/27/2017 1648   HGBUR SMALL (A) 06/27/2017 1648   BILIRUBINUR NEGATIVE 06/27/2017 1648   KETONESUR NEGATIVE 06/27/2017 1648   PROTEINUR NEGATIVE 06/27/2017 1648   UROBILINOGEN 0.2 11/30/2012 1230   NITRITE NEGATIVE 06/27/2017 1648   LEUKOCYTESUR SMALL (A) 06/27/2017 1648   Sepsis Labs Invalid input(s): PROCALCITONIN,  WBC,  LACTICIDVEN Microbiology No results found for this or any previous visit (from the past 240 hour(s)).   Time coordinating discharge: 40 minutes  SIGNED:   [name], MD  [facility] 06/02/2018, 9:17 AM Pager   If 7PM-7AM, please contact night-coverage [email or web address] Password [email or web address]

## 2018-06-02 NOTE — Discharge Instructions (Signed)
[name] was admitted to the Hospital on 05/31/2018 and Discharged on Discharge Date 06/02/2018 and should be excused from work/school   for 2   days starting 05/31/2018 , may return to work/school without any restrictions.  Call [name] MD, [facility] [phone] with questions.  [name] M.D on 06/02/2018,at 9:04 AM  [facility] Office  [phone]

## 2018-06-02 NOTE — Progress Notes (Signed)
ANTICOAGULATION CONSULT NOTE - Follow Up Consult  Pharmacy Consult for Lovenox Indication: hx recurrent DVTs, Afib, Antiphospholipid antibody syndrome   Allergies  Allergen Reactions  . Demerol   . Codeine Nausea And Vomiting  . Ultram [Tramadol Hcl] Other (See Comments)    Makes her hands shake.    Patient Measurements: Height: 5\' 6"  (167.6 cm) Weight: 160 lb (72.6 kg) IBW/kg (Calculated) : 59.3  Vital Signs: Temp: 98.4 F (36.9 C) (10/27 0355) Temp Source: Oral (10/27 0355) BP: 119/64 (10/27 0355) Pulse Rate: 70 (10/27 0355)  Labs: Recent Labs    05/31/18 1108 05/31/18 1532 06/01/18 0400 06/02/18 0354  HGB 14.3  --  12.4 12.3  HCT 45.7  --  39.7 39.2  PLT 299  --  275 278  LABPROT  --  24.6* 28.4* 25.8*  INR  --  2.26 2.72 2.39  CREATININE 0.77  --  0.70  --     Estimated Creatinine Clearance: 73.4 mL/min (by C-G formula based on SCr of 0.7 mg/dL).   Medications:  Scheduled:  . chlorhexidine  15 mL Mouth/Throat QID  . cycloSPORINE  1 drop Both Eyes Daily  . enoxaparin (LOVENOX) injection  1 mg/kg Subcutaneous Q12H  . fluticasone  2 spray Each Nare Daily  . gabapentin  1,200 mg Oral BID  . metoprolol succinate  12.5 mg Oral Daily  . pantoprazole  40 mg Oral BID  . progesterone  100 mg Oral Daily  . saccharomyces boulardii  250 mg Oral BID   Infusions:  . sodium chloride Stopped (05/31/18 1810)  . clindamycin (CLEOCIN) IV 600 mg (06/02/18 0559)    Assessment: 51 yoF admitted on 10/25 with left facial swelling.  PMH includes chronic warfarin anticoagulation for hx crest syndrome, lupus, antiphospholipid antibody syndrome, recurrent DVTs and afib.  Pharmacy was initially consulted to resume warfarin dosing, but on 10/27 for discharge, pharmacy is consulted for Lovenox dosing (warfarin is held) prior to dental procedure.  PTA Warfarin 7.5 mg daily except 5 mg on Wed, Sat, and Sun (last dose taken on 05/31/18) --> verified with patient Admission INR  2.26  Today, 06/02/2018:  INR 2.39, slightly subtherapeutic based on higher INR goal  CBC: Hgb 12.3, Plt WNL  No bleeding or complications reported  No major drug-drug interactions noted.  Antibiotics may affect GI flora and vitamin K production.  Goal of Therapy:  INR 2.5-3.5 (per patient) Monitor platelets by anticoagulation protocol: Yes   Plan:   Hold warfarin per MD  Lovenox 75 mg [place] q12h.    Gretta Arab PharmD, BCPS Pager [phone] 06/02/2018 9:18 AM

## 2018-06-03 ENCOUNTER — Other Ambulatory Visit: Payer: Self-pay | Admitting: *Deleted

## 2018-06-03 MED FILL — ENOXAPARIN 80 MG/0.8 ML SYR: 80 | 9 days supply | Qty: 14 | Fill #0

## 2018-06-03 MED FILL — METOPROLOL TARTRATE 25 MG T: 25 | 30 days supply | Qty: 60 | Fill #0

## 2018-06-03 NOTE — Patient Outreach (Addendum)
[facility]) Care Management  06/03/2018  PAVNEET MARKWOOD September 20, 1955 [id number]   Subjective: Telephone call to patient's home  / mobile number, no answer, left HIPAA compliant voicemail message, and requested call back.     Objective: Per KPN (Knowledge Performance Now, point of care tool) and chart review, patient hospitalized 05/31/18 - 06/02/18 for facial cellulitis, Acute periodontal abscess.     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       Assessment: Received [facility] Transition of care referral on 06/03/18.   Transition of care follow up pending patient contact.       Plan: RNCM will send unsuccessful outreach  letter, [facility] pamphlet, will call patient for 2nd telephone outreach attempt, transition of care follow up, and proceed with case closure, within 10 business days if no return call.        Neyda Durango H. Annia Friendly, BSN, [place] [facility] Telephonic CM Phone: [phone] Fax: [phone]

## 2018-06-04 ENCOUNTER — Other Ambulatory Visit: Payer: Self-pay | Admitting: *Deleted

## 2018-06-04 NOTE — Patient Outreach (Signed)
[facility]) Care Management  06/04/2018  [name] 11/06/1955 [id number]   Subjective: Telephone call to patient's home  / mobile number, no answer, left HIPAA compliant voicemail message, and requested call back.     Objective: Per KPN (Knowledge Performance Now, point of care tool) and chart review, patient hospitalized 05/31/18 - 06/02/18 for facial cellulitis, Acute periodontal abscess.     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       Assessment: Received [facility] Transition of care referral on 06/03/18.   Transition of care follow up pending patient contact.       Plan: RNCM has sent unsuccessful outreach  letter, [facility] pamphlet, will call patient for 3rd telephone outreach attempt, transition of care follow up, and proceed with case closure, within 10 business days if no return call.       Aniston Christman H. Annia Friendly, BSN, [place] [facility] Telephonic CM Phone: [phone] Fax: [phone]

## 2018-06-05 ENCOUNTER — Other Ambulatory Visit: Payer: Self-pay | Admitting: *Deleted

## 2018-06-05 NOTE — Patient Outreach (Signed)
[facility]) Care Management  06/05/2018  [name] 1956/03/21 [id number]   Subjective: Telephone call to patient's home  / mobile number, no answer, voicemail full, and unable to leave a message.    Objective:Per KPN (Knowledge Performance Now, point of care tool) and chart review,patient hospitalized 05/31/18 - 06/02/18 for facial cellulitis,Acute periodontal abscess. 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     Assessment: Received [facility] Transition of care referral on 06/03/18.Transition of care follow up pending patient contact.      Plan:RNCM has sent unsuccessful outreach letter, [facility] pamphlet, and will proceed with case closure, within 10 business days if no return call.       Abbey Veith H. Annia Friendly, BSN, [place] Management [facility] Telephonic CM Phone: [phone] Fax: [phone]

## 2018-06-12 MED FILL — FUROSEMIDE 40 MG TAB: 40 | 90 days supply | Qty: 180 | Fill #0

## 2018-06-12 MED FILL — NIFEDIPINE ER 30 MG TABLET: 30 | 30 days supply | Qty: 30 | Fill #1

## 2018-06-12 MED FILL — POTASSIUM CL ER 20 MEQ TABL: 20 | 90 days supply | Qty: 90 | Fill #0

## 2018-06-14 ENCOUNTER — Other Ambulatory Visit: Payer: Self-pay | Admitting: *Deleted

## 2018-06-14 NOTE — Patient Outreach (Signed)
[facility]) Care Management  06/14/2018  VONETTA FOULK 12-Apr-1956 [id number]   No response from patient outreach attempts will proceed with case closure.     Objective:Per KPN (Knowledge Performance Now, point of care tool) and chart review,patient hospitalized 05/31/18 - 06/02/18 for facial cellulitis,Acute periodontal abscess. 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      Assessment: Received [facility] Transition of care referral on 06/03/18.Transition of care follow up not completed due to unable to contact patient and will proceed with case closure.      Plan:Case closure due to unable to reach.       Tyishia Aune H. Annia Friendly, BSN, [place] Management [facility] Telephonic CM Phone: [phone] Fax: [phone]

## 2018-06-27 DIAGNOSIS — M341 CR(E)ST syndrome: Secondary | ICD-10-CM | POA: Diagnosis not present

## 2018-06-27 DIAGNOSIS — Z7901 Long term (current) use of anticoagulants: Secondary | ICD-10-CM | POA: Diagnosis not present

## 2018-07-01 MED FILL — WARFARIN SODIUM 5 MG TABLET: 5 | 45 days supply | Qty: 90 | Fill #1

## 2018-07-01 MED FILL — ESOMEPRAZOLE MAG DR 40 MG C: 40 | 30 days supply | Qty: 60 | Fill #0

## 2018-07-01 MED FILL — PROGESTERONE 100 MG CAPSULE: 100 | 90 days supply | Qty: 90 | Fill #1

## 2018-07-10 DIAGNOSIS — R6882 Decreased libido: Secondary | ICD-10-CM | POA: Diagnosis not present

## 2018-07-10 MED FILL — GABAPENTIN 600 MG TABLET: 600 | 90 days supply | Qty: 360 | Fill #0

## 2018-07-17 DIAGNOSIS — I788 Other diseases of capillaries: Secondary | ICD-10-CM | POA: Diagnosis not present

## 2018-07-17 DIAGNOSIS — D225 Melanocytic nevi of trunk: Secondary | ICD-10-CM | POA: Diagnosis not present

## 2018-07-17 DIAGNOSIS — M341 CR(E)ST syndrome: Secondary | ICD-10-CM | POA: Diagnosis not present

## 2018-07-17 DIAGNOSIS — Z7901 Long term (current) use of anticoagulants: Secondary | ICD-10-CM | POA: Diagnosis not present

## 2018-07-17 DIAGNOSIS — L814 Other melanin hyperpigmentation: Secondary | ICD-10-CM | POA: Diagnosis not present

## 2018-07-17 DIAGNOSIS — Z85828 Personal history of other malignant neoplasm of skin: Secondary | ICD-10-CM | POA: Diagnosis not present

## 2018-07-17 DIAGNOSIS — I872 Venous insufficiency (chronic) (peripheral): Secondary | ICD-10-CM | POA: Diagnosis not present

## 2018-07-17 DIAGNOSIS — Z08 Encounter for follow-up examination after completed treatment for malignant neoplasm: Secondary | ICD-10-CM | POA: Diagnosis not present

## 2018-07-23 DIAGNOSIS — Z7901 Long term (current) use of anticoagulants: Secondary | ICD-10-CM | POA: Diagnosis not present

## 2018-07-23 DIAGNOSIS — M341 CR(E)ST syndrome: Secondary | ICD-10-CM | POA: Diagnosis not present

## 2018-07-30 DIAGNOSIS — Z7901 Long term (current) use of anticoagulants: Secondary | ICD-10-CM | POA: Diagnosis not present

## 2018-08-02 DIAGNOSIS — I480 Paroxysmal atrial fibrillation: Secondary | ICD-10-CM | POA: Diagnosis not present

## 2018-08-02 DIAGNOSIS — M341 CR(E)ST syndrome: Secondary | ICD-10-CM | POA: Diagnosis not present

## 2018-08-02 DIAGNOSIS — Z7901 Long term (current) use of anticoagulants: Secondary | ICD-10-CM | POA: Diagnosis not present

## 2018-08-13 MED FILL — ESOMEPRAZOLE MAG DR 40 MG C: 40 | 30 days supply | Qty: 60 | Fill #1

## 2018-08-15 DIAGNOSIS — M341 CR(E)ST syndrome: Secondary | ICD-10-CM | POA: Diagnosis not present

## 2018-08-15 DIAGNOSIS — I480 Paroxysmal atrial fibrillation: Secondary | ICD-10-CM | POA: Diagnosis not present

## 2018-08-15 DIAGNOSIS — Z7901 Long term (current) use of anticoagulants: Secondary | ICD-10-CM | POA: Diagnosis not present

## 2018-08-19 MED FILL — METOPROLOL TARTRATE 25 MG T: 25 | 30 days supply | Qty: 60 | Fill #0

## 2018-08-19 MED FILL — NIFEDIPINE ER 30 MG TABLET: 30 | 30 days supply | Qty: 30 | Fill #2

## 2018-08-26 DIAGNOSIS — R6882 Decreased libido: Secondary | ICD-10-CM | POA: Diagnosis not present

## 2018-08-26 DIAGNOSIS — Z7901 Long term (current) use of anticoagulants: Secondary | ICD-10-CM | POA: Diagnosis not present

## 2018-09-09 MED FILL — POTASSIUM CHLORIDE CRYS ER: 20 | 90 days supply | Qty: 90 | Fill #1

## 2018-09-09 MED FILL — ESOMEPRAZOLE MAG DR 40 MG C: 40 | 30 days supply | Qty: 60 | Fill #0

## 2018-09-18 DIAGNOSIS — Z7901 Long term (current) use of anticoagulants: Secondary | ICD-10-CM | POA: Diagnosis not present

## 2018-09-18 DIAGNOSIS — E559 Vitamin D deficiency, unspecified: Secondary | ICD-10-CM | POA: Diagnosis not present

## 2018-09-18 DIAGNOSIS — M8580 Other specified disorders of bone density and structure, unspecified site: Secondary | ICD-10-CM | POA: Diagnosis not present

## 2018-09-18 DIAGNOSIS — E663 Overweight: Secondary | ICD-10-CM | POA: Diagnosis not present

## 2018-09-18 DIAGNOSIS — Z6826 Body mass index (BMI) 26.0-26.9, adult: Secondary | ICD-10-CM | POA: Diagnosis not present

## 2018-09-18 DIAGNOSIS — M340 Progressive systemic sclerosis: Secondary | ICD-10-CM | POA: Diagnosis not present

## 2018-09-18 DIAGNOSIS — M150 Primary generalized (osteo)arthritis: Secondary | ICD-10-CM | POA: Diagnosis not present

## 2018-09-18 DIAGNOSIS — I7300 Raynaud's syndrome without gangrene: Secondary | ICD-10-CM | POA: Diagnosis not present

## 2018-09-18 DIAGNOSIS — D693 Immune thrombocytopenic purpura: Secondary | ICD-10-CM | POA: Diagnosis not present

## 2018-09-18 DIAGNOSIS — K219 Gastro-esophageal reflux disease without esophagitis: Secondary | ICD-10-CM | POA: Diagnosis not present

## 2018-09-23 MED FILL — PROGESTERONE 100 MG CAPSULE: 100 | 90 days supply | Qty: 90 | Fill #2

## 2018-09-23 MED FILL — METOPROLOL TARTRATE 25 MG T: 25 | 30 days supply | Qty: 60 | Fill #1

## 2018-09-24 DIAGNOSIS — Z7901 Long term (current) use of anticoagulants: Secondary | ICD-10-CM | POA: Diagnosis not present

## 2018-09-24 MED FILL — FUROSEMIDE 40 MG TAB: 40 | 90 days supply | Qty: 180 | Fill #0

## 2018-09-25 MED FILL — WARFARIN SODIUM 5 MG TABLET: 5 | 57 days supply | Qty: 114 | Fill #0

## 2018-10-01 MED FILL — OSELTAMIVIR PHOSPHATE 75 MG: 75 | 10 days supply | Qty: 10 | Fill #0

## 2018-10-02 DIAGNOSIS — Z7901 Long term (current) use of anticoagulants: Secondary | ICD-10-CM | POA: Diagnosis not present

## 2018-10-08 MED FILL — NIFEDIPINE ER 30 MG TABLET: 30 | 30 days supply | Qty: 30 | Fill #0

## 2018-10-08 MED FILL — GABAPENTIN 600 MG TABLET: 600 | 30 days supply | Qty: 120 | Fill #0

## 2018-10-11 MED FILL — ESOMEPRAZOLE MAG DR 40 MG C: 40 | 30 days supply | Qty: 60 | Fill #1

## 2018-10-24 DIAGNOSIS — Z7901 Long term (current) use of anticoagulants: Secondary | ICD-10-CM | POA: Diagnosis not present

## 2018-10-30 MED FILL — ESOMEPRAZOLE MAG DR 40 MG C: 40 | 30 days supply | Qty: 60 | Fill #0

## 2018-10-30 MED FILL — METOPROLOL TARTRATE 25 MG T: 25 | 30 days supply | Qty: 60 | Fill #2

## 2018-11-07 MED FILL — GABAPENTIN 600 MG TABLET: 600 | 30 days supply | Qty: 120 | Fill #0

## 2018-12-06 MED FILL — IPRATROPIUM 0.03% SPRAY: 0.03 | 21 days supply | Qty: 30 | Fill #1

## 2018-12-06 MED FILL — GABAPENTIN 600 MG TABLET: 600 | 30 days supply | Qty: 120 | Fill #1

## 2018-12-06 MED FILL — WARFARIN SODIUM 5 MG TABLET: 5 | 57 days supply | Qty: 114 | Fill #1

## 2018-12-06 MED FILL — PROGESTERONE 100 MG CAPSULE: 100 | 90 days supply | Qty: 90 | Fill #3

## 2018-12-06 MED FILL — METOPROLOL TARTRATE 25 MG T: 25 | 30 days supply | Qty: 60 | Fill #3

## 2018-12-06 MED FILL — ESOMEPRAZOLE MAG DR 40 MG C: 40 | 30 days supply | Qty: 60 | Fill #1

## 2018-12-06 MED FILL — FUROSEMIDE 40 MG TAB: 40 | 90 days supply | Qty: 180 | Fill #1

## 2018-12-11 MED FILL — POTASSIUM CHLORIDE CRYS ER: 20 | 30 days supply | Qty: 30 | Fill #0

## 2018-12-11 MED FILL — NIFEdipine ER 30 MG TB24: 30 | 30 days supply | Qty: 30 | Fill #0

## 2018-12-12 DIAGNOSIS — M341 CR(E)ST syndrome: Secondary | ICD-10-CM | POA: Diagnosis not present

## 2018-12-12 DIAGNOSIS — I480 Paroxysmal atrial fibrillation: Secondary | ICD-10-CM | POA: Diagnosis not present

## 2018-12-12 DIAGNOSIS — M810 Age-related osteoporosis without current pathological fracture: Secondary | ICD-10-CM | POA: Diagnosis not present

## 2018-12-12 DIAGNOSIS — G8929 Other chronic pain: Secondary | ICD-10-CM | POA: Diagnosis not present

## 2018-12-12 DIAGNOSIS — D6861 Antiphospholipid syndrome: Secondary | ICD-10-CM | POA: Diagnosis not present

## 2018-12-12 DIAGNOSIS — R293 Abnormal posture: Secondary | ICD-10-CM | POA: Diagnosis not present

## 2019-01-07 DIAGNOSIS — K6389 Other specified diseases of intestine: Secondary | ICD-10-CM | POA: Diagnosis not present

## 2019-01-07 DIAGNOSIS — K529 Noninfective gastroenteritis and colitis, unspecified: Secondary | ICD-10-CM | POA: Diagnosis not present

## 2019-01-07 MED FILL — NIFEdipine ER 30 MG TB24: 30 | 90 days supply | Qty: 90 | Fill #0

## 2019-01-07 MED FILL — CIPROFLOXACIN HCL 500 MG TA: 500 | 30 days supply | Qty: 60 | Fill #0

## 2019-01-07 MED FILL — POTASSIUM CHLORIDE CRYS ER: 20 | 90 days supply | Qty: 90 | Fill #0

## 2019-01-09 MED FILL — GABAPENTIN 600 MG TABLET: 600 | 30 days supply | Qty: 120 | Fill #2

## 2019-01-13 MED FILL — ESOMEPRAZOLE MAG DR 40 MG C: 40 | 30 days supply | Qty: 60 | Fill #0

## 2019-01-16 DIAGNOSIS — E559 Vitamin D deficiency, unspecified: Secondary | ICD-10-CM | POA: Diagnosis not present

## 2019-01-16 DIAGNOSIS — Z8262 Family history of osteoporosis: Secondary | ICD-10-CM | POA: Diagnosis not present

## 2019-01-16 DIAGNOSIS — Z79899 Other long term (current) drug therapy: Secondary | ICD-10-CM | POA: Diagnosis not present

## 2019-01-16 DIAGNOSIS — M810 Age-related osteoporosis without current pathological fracture: Secondary | ICD-10-CM | POA: Diagnosis not present

## 2019-01-21 DIAGNOSIS — M24477 Recurrent dislocation, right toe(s): Secondary | ICD-10-CM | POA: Diagnosis not present

## 2019-01-21 DIAGNOSIS — M779 Enthesopathy, unspecified: Secondary | ICD-10-CM | POA: Diagnosis not present

## 2019-01-21 DIAGNOSIS — M2041 Other hammer toe(s) (acquired), right foot: Secondary | ICD-10-CM | POA: Diagnosis not present

## 2019-01-21 DIAGNOSIS — Z7901 Long term (current) use of anticoagulants: Secondary | ICD-10-CM | POA: Diagnosis not present

## 2019-01-22 DIAGNOSIS — Z13820 Encounter for screening for osteoporosis: Secondary | ICD-10-CM | POA: Diagnosis not present

## 2019-01-22 DIAGNOSIS — R6882 Decreased libido: Secondary | ICD-10-CM | POA: Diagnosis not present

## 2019-01-29 DIAGNOSIS — M810 Age-related osteoporosis without current pathological fracture: Secondary | ICD-10-CM | POA: Diagnosis not present

## 2019-01-29 DIAGNOSIS — R293 Abnormal posture: Secondary | ICD-10-CM | POA: Diagnosis not present

## 2019-01-29 DIAGNOSIS — G8929 Other chronic pain: Secondary | ICD-10-CM | POA: Diagnosis not present

## 2019-02-06 MED FILL — GABAPENTIN 600 MG TABLET: 600 | 30 days supply | Qty: 120 | Fill #0

## 2019-02-07 MED FILL — ESOMEPRAZOLE MAG DR 40 MG C: 40 | 30 days supply | Qty: 60 | Fill #1

## 2019-02-19 DIAGNOSIS — M341 CR(E)ST syndrome: Secondary | ICD-10-CM | POA: Diagnosis not present

## 2019-02-19 DIAGNOSIS — Z8262 Family history of osteoporosis: Secondary | ICD-10-CM | POA: Diagnosis not present

## 2019-02-19 DIAGNOSIS — E559 Vitamin D deficiency, unspecified: Secondary | ICD-10-CM | POA: Diagnosis not present

## 2019-02-19 DIAGNOSIS — Z7901 Long term (current) use of anticoagulants: Secondary | ICD-10-CM | POA: Diagnosis not present

## 2019-02-19 DIAGNOSIS — M810 Age-related osteoporosis without current pathological fracture: Secondary | ICD-10-CM | POA: Diagnosis not present

## 2019-02-22 MED FILL — CIPROFLOXACIN HCL 500 MG TA: 500 | 30 days supply | Qty: 60 | Fill #1

## 2019-02-26 DIAGNOSIS — R6882 Decreased libido: Secondary | ICD-10-CM | POA: Diagnosis not present

## 2019-03-03 MED FILL — GABAPENTIN 600 MG TABLET: 600 | 30 days supply | Qty: 120 | Fill #1

## 2019-03-08 MED FILL — ESOMEPRAZOLE MAG DR 40 MG C: 40 | 30 days supply | Qty: 60 | Fill #2

## 2019-03-10 MED FILL — METOPROLOL TARTRATE 25 MG T: 25 | 30 days supply | Qty: 60 | Fill #4

## 2019-03-19 DIAGNOSIS — M8580 Other specified disorders of bone density and structure, unspecified site: Secondary | ICD-10-CM | POA: Diagnosis not present

## 2019-03-19 DIAGNOSIS — I7300 Raynaud's syndrome without gangrene: Secondary | ICD-10-CM | POA: Diagnosis not present

## 2019-03-19 DIAGNOSIS — D693 Immune thrombocytopenic purpura: Secondary | ICD-10-CM | POA: Diagnosis not present

## 2019-03-19 DIAGNOSIS — K219 Gastro-esophageal reflux disease without esophagitis: Secondary | ICD-10-CM | POA: Diagnosis not present

## 2019-03-19 DIAGNOSIS — Z6828 Body mass index (BMI) 28.0-28.9, adult: Secondary | ICD-10-CM | POA: Diagnosis not present

## 2019-03-19 DIAGNOSIS — M340 Progressive systemic sclerosis: Secondary | ICD-10-CM | POA: Diagnosis not present

## 2019-03-19 DIAGNOSIS — M150 Primary generalized (osteo)arthritis: Secondary | ICD-10-CM | POA: Diagnosis not present

## 2019-03-19 DIAGNOSIS — E663 Overweight: Secondary | ICD-10-CM | POA: Diagnosis not present

## 2019-03-19 DIAGNOSIS — E559 Vitamin D deficiency, unspecified: Secondary | ICD-10-CM | POA: Diagnosis not present

## 2019-03-24 MED FILL — PROGESTERONE 100 MG CAPSULE: 100 | 90 days supply | Qty: 90 | Fill #0

## 2019-03-26 DIAGNOSIS — R6882 Decreased libido: Secondary | ICD-10-CM | POA: Diagnosis not present

## 2019-03-27 MED FILL — WARFARIN SODIUM 5 MG TABLET: 5 | 57 days supply | Qty: 114 | Fill #0

## 2019-04-01 MED FILL — NIFEdipine ER 30 MG TB24: 30 | 90 days supply | Qty: 90 | Fill #1

## 2019-04-01 MED FILL — GABAPENTIN 600 MG TABLET: 600 | 30 days supply | Qty: 120 | Fill #2

## 2019-04-01 MED FILL — POTASSIUM CHLORIDE CRYS ER: 20 | 90 days supply | Qty: 90 | Fill #1

## 2019-04-02 DIAGNOSIS — M341 CR(E)ST syndrome: Secondary | ICD-10-CM | POA: Diagnosis not present

## 2019-04-02 DIAGNOSIS — Z7901 Long term (current) use of anticoagulants: Secondary | ICD-10-CM | POA: Diagnosis not present

## 2019-04-02 DIAGNOSIS — G509 Disorder of trigeminal nerve, unspecified: Secondary | ICD-10-CM | POA: Diagnosis not present

## 2019-04-02 DIAGNOSIS — G8929 Other chronic pain: Secondary | ICD-10-CM | POA: Diagnosis not present

## 2019-04-02 DIAGNOSIS — I272 Pulmonary hypertension, unspecified: Secondary | ICD-10-CM | POA: Diagnosis not present

## 2019-04-02 DIAGNOSIS — I480 Paroxysmal atrial fibrillation: Secondary | ICD-10-CM | POA: Diagnosis not present

## 2019-04-02 DIAGNOSIS — M340 Progressive systemic sclerosis: Secondary | ICD-10-CM | POA: Diagnosis not present

## 2019-04-02 MED FILL — FUROSEMIDE 40 MG TAB: 40 | 90 days supply | Qty: 180 | Fill #0

## 2019-04-07 MED FILL — ESOMEPRAZOLE MAG DR 40 MG C: 40 | 30 days supply | Qty: 60 | Fill #3

## 2019-05-02 MED FILL — GABAPENTIN 600 MG TABLET: 600 | 30 days supply | Qty: 120 | Fill #0

## 2019-05-07 MED FILL — ESOMEPRAZOLE MAG DR 40 MG C: 40 | 30 days supply | Qty: 60 | Fill #0

## 2019-05-14 DIAGNOSIS — Z7901 Long term (current) use of anticoagulants: Secondary | ICD-10-CM | POA: Diagnosis not present

## 2019-05-14 DIAGNOSIS — Z01419 Encounter for gynecological examination (general) (routine) without abnormal findings: Secondary | ICD-10-CM | POA: Diagnosis not present

## 2019-05-14 DIAGNOSIS — Z6829 Body mass index (BMI) 29.0-29.9, adult: Secondary | ICD-10-CM | POA: Diagnosis not present

## 2019-05-14 DIAGNOSIS — Z1231 Encounter for screening mammogram for malignant neoplasm of breast: Secondary | ICD-10-CM | POA: Diagnosis not present

## 2019-05-17 MED FILL — WARFARIN SODIUM 5 MG TABLET: 5 | 57 days supply | Qty: 114 | Fill #1

## 2019-05-19 MED FILL — METOPROLOL TARTRATE 25 MG T: 25 | 30 days supply | Qty: 60 | Fill #0

## 2019-05-26 MED FILL — GABAPENTIN 600 MG TABLET: 600 | 30 days supply | Qty: 120 | Fill #1

## 2019-05-27 DIAGNOSIS — S92514A Nondisplaced fracture of proximal phalanx of right lesser toe(s), initial encounter for closed fracture: Secondary | ICD-10-CM | POA: Diagnosis not present

## 2019-05-27 DIAGNOSIS — M79671 Pain in right foot: Secondary | ICD-10-CM | POA: Diagnosis not present

## 2019-05-31 MED FILL — ESOMEPRAZOLE MAG DR 40 MG C: 40 | 30 days supply | Qty: 60 | Fill #1

## 2019-06-17 MED FILL — PROGESTERONE 100 MG CAPSULE: 100 | 90 days supply | Qty: 90 | Fill #0

## 2019-06-25 DIAGNOSIS — Z7901 Long term (current) use of anticoagulants: Secondary | ICD-10-CM | POA: Diagnosis not present

## 2019-06-25 MED FILL — FUROSEMIDE 40 MG TAB: 40 | 90 days supply | Qty: 180 | Fill #1

## 2019-06-25 MED FILL — GABAPENTIN 600 MG TABLET: 600 | 30 days supply | Qty: 120 | Fill #2

## 2019-06-30 MED FILL — NIFEdipine ER 30 MG TB24: 30 | 90 days supply | Qty: 90 | Fill #0

## 2019-06-30 MED FILL — POTASSIUM CHLORIDE CRYS ER: 20 MEQ | 90 days supply | Qty: 90 | Fill #0

## 2019-06-30 MED FILL — ESOMEPRAZOLE MAG DR 40 MG C: 40 | 30 days supply | Qty: 60 | Fill #2

## 2019-07-07 DIAGNOSIS — Q667 Congenital pes cavus, unspecified foot: Secondary | ICD-10-CM | POA: Diagnosis not present

## 2019-07-07 DIAGNOSIS — S92514D Nondisplaced fracture of proximal phalanx of right lesser toe(s), subsequent encounter for fracture with routine healing: Secondary | ICD-10-CM | POA: Diagnosis not present

## 2019-07-07 DIAGNOSIS — L853 Xerosis cutis: Secondary | ICD-10-CM | POA: Diagnosis not present

## 2019-07-07 MED FILL — AMMONIUM LACTATE 12% LOTION: 12 | 14 days supply | Qty: 452 | Fill #0

## 2019-07-09 IMAGING — CT CT MAXILLOFACIAL W/ CM
3 of 4 series · 16 of 47 positions shown, 19 images · IV contrast (ISOVUE)
Comparison: 10/10/2012

CLINICAL DATA: Dental disease on the left with swelling and redness
.

EXAM:
CT MAXILLOFACIAL WITH CONTRAST
TECHNIQUE: Multidetector CT imaging of the maxillofacial structures was
performed with intravenous contrast. Multiplanar CT image
reconstructions were also generated.
CONTRAST:  75mL OMNIPAQUE IOHEXOL 300 MG/ML  SOLN

[Series 3: max soft · axial · 0.34mm/px · z∈[+1284,+1426]mm · 11 of 83 slices shown, 14 images]
[im 6/83  brain]
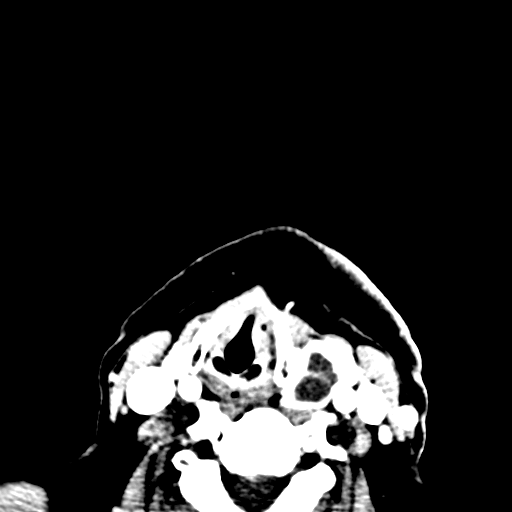
[im 6/83  bone]
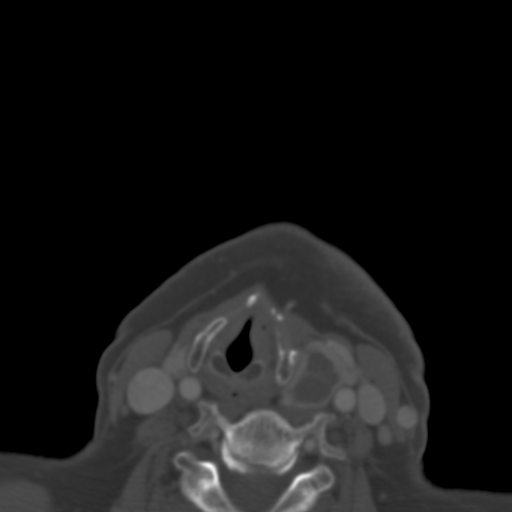
[im 12/83  bone]
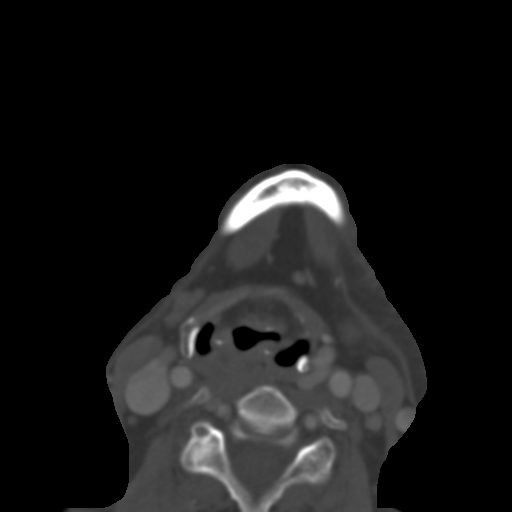
[im 20/83  bone]
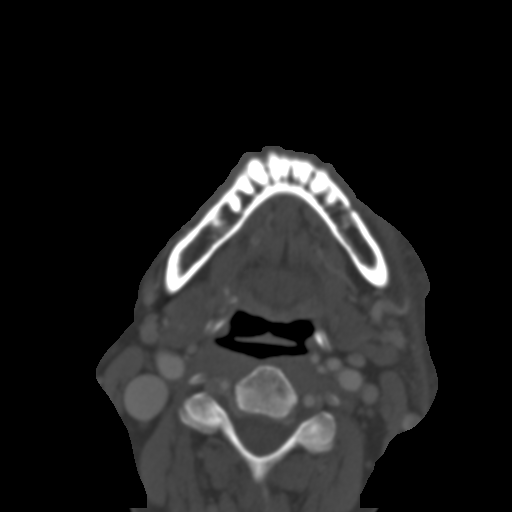
[im 26/83  bone]
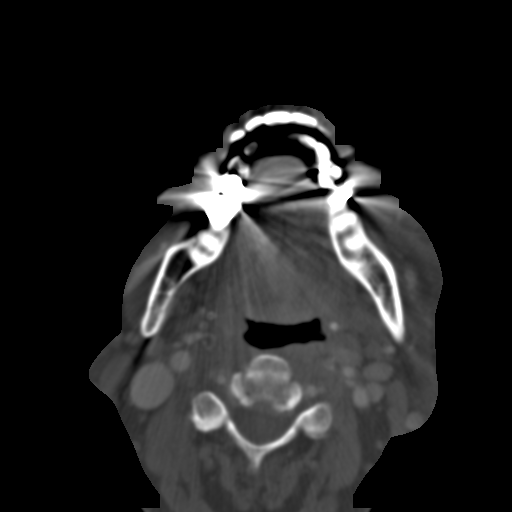
[im 37/83  brain]
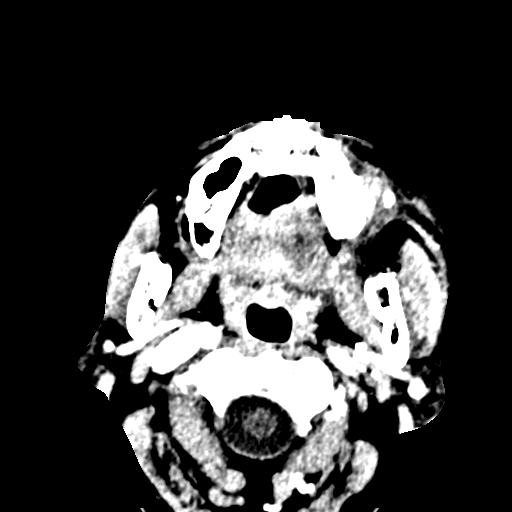
[im 37/83  bone]
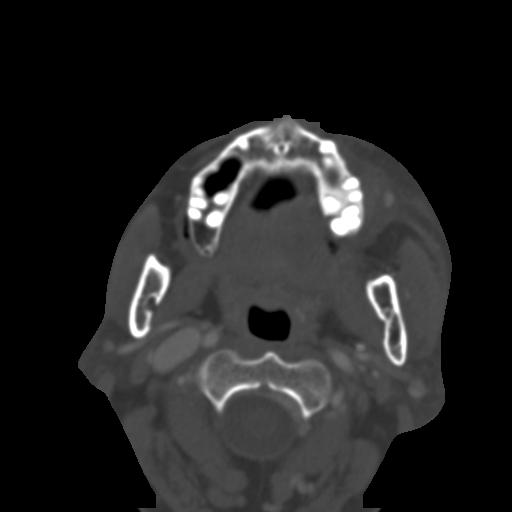
[im 43/83  bone]
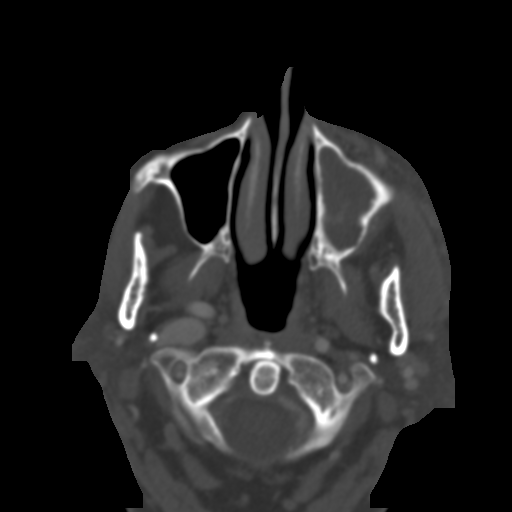
[im 49/83  bone]
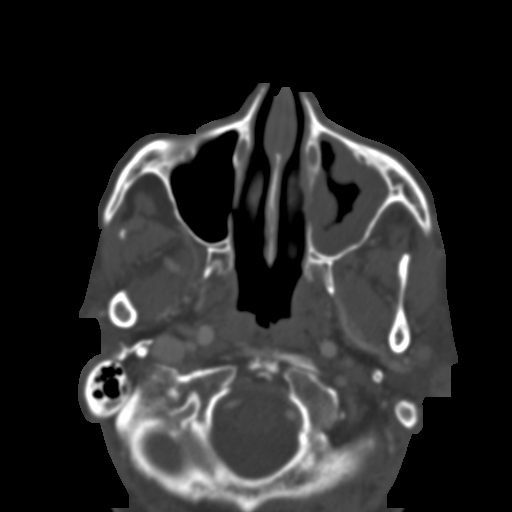
[im 57/83  bone]
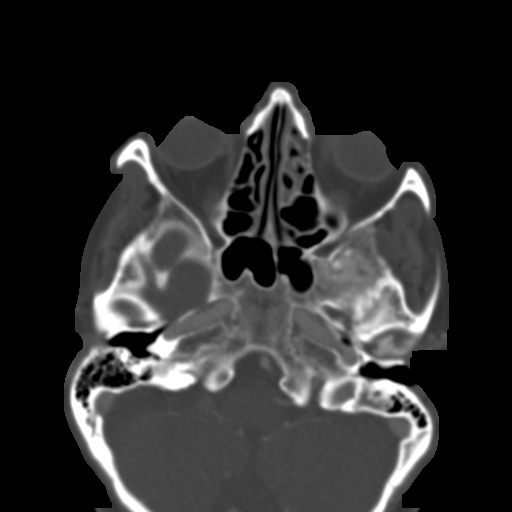
[im 63/83  brain]
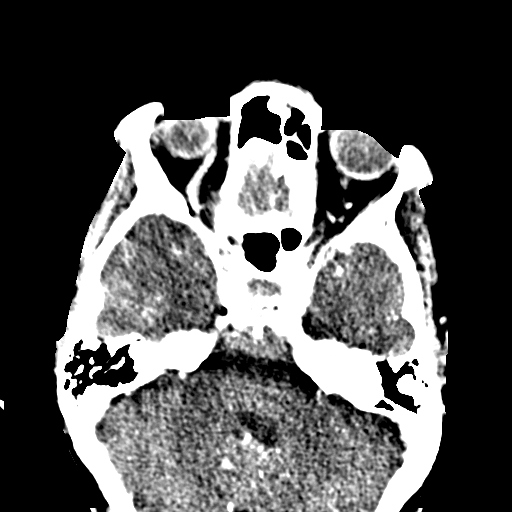
[im 63/83  bone]
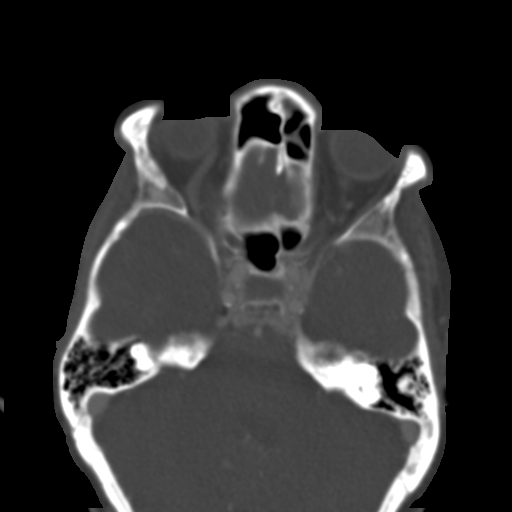
[im 71/83  bone]
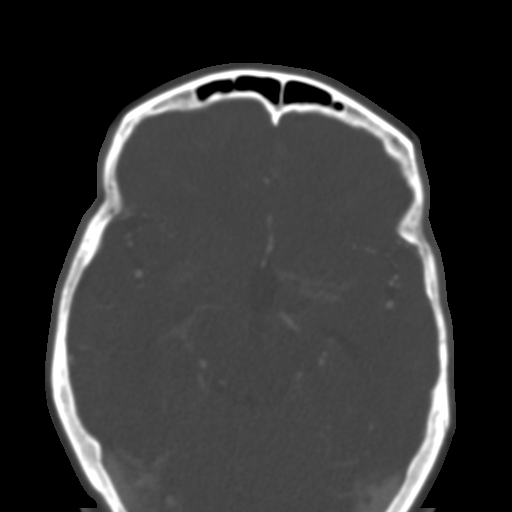
[im 77/83  bone]
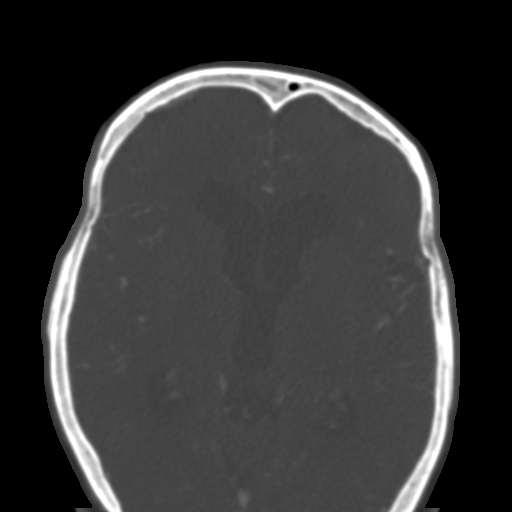

[Series 7: coronal soft · coronal · 0.35mm/px · 3 of 89 slices shown]
[im 30/89  bone]
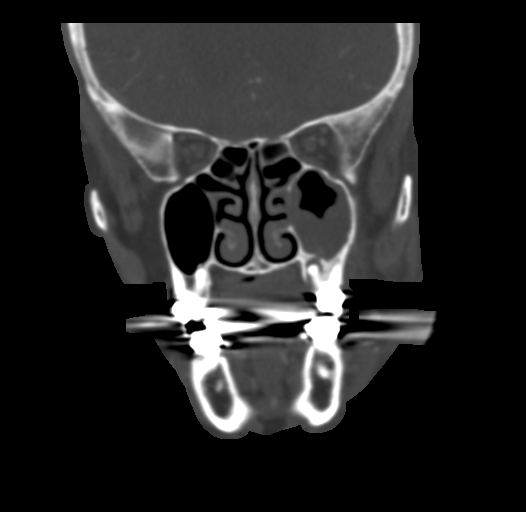
[im 40/89  bone]
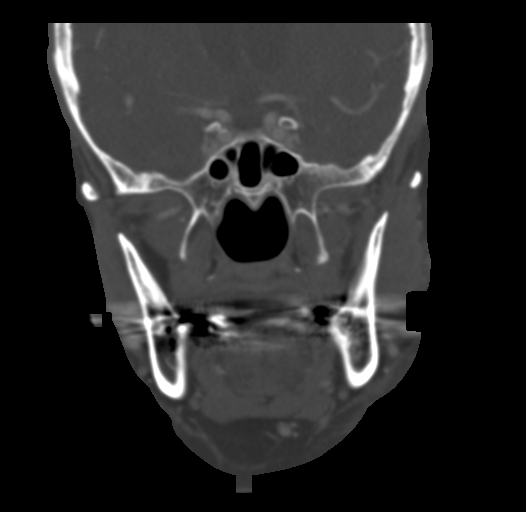
[im 49/89  bone]
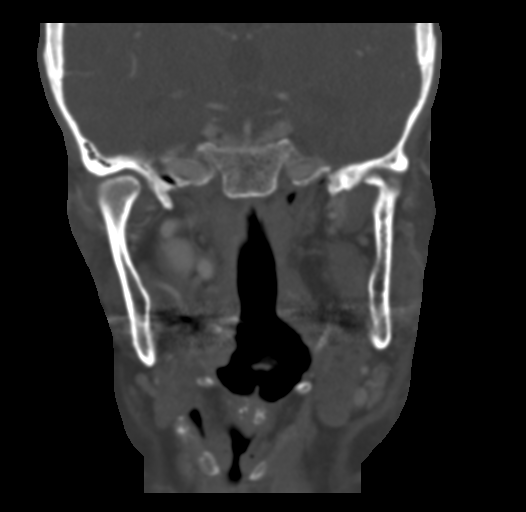

[Series 10: sagittal bone · sagittal · 0.36mm/px · 2 of 80 slices shown]
[im 27/80  bone]
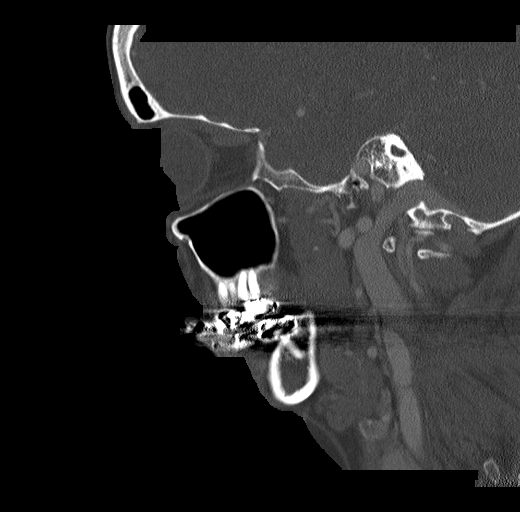
[im 53/80  bone]
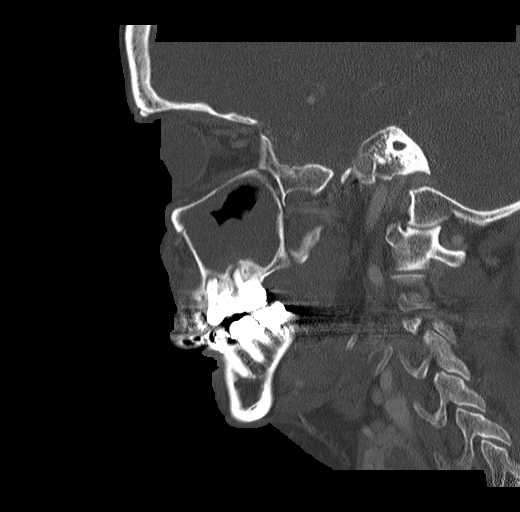

[16 of 47 positions shown; findings below may reference images not displayed]

FINDINGS: Osseous: Advanced periodontal disease affecting the left maxillary
molars, tooth 13 and tooth 14. Tooth 14 shows periapical radicular
cyst formation with erosion into the sinus and associated
odontogenic sinus inflammatory disease. Adjacent gingival
inflammation and left facial cellulitis. No sign of drainable soft
tissue abscess.

Orbits: Negative

Sinuses: Odontogenic inflammation of the left maxillary sinus as
noted above. Other sinuses clear.

Soft tissues: Left facial cellulitis and left maxillary gingival
inflammation as described above. No sign of drainable abscess. Multi
septated cystic lesion of the left thyroid measuring up to 2.8 cm in
diameter. This was visible on a CT scan of 1637 an appeared quite
similar, therefore quite likely benign. The entire thyroid was not
evaluated.

Limited intracranial: Negative
IMPRESSION: Advanced periodontal disease of the left maxillary molars, worse at
tooth 14 than tooth 13.. Apical radicular cyst formation at tooth 14
with erosion into the maxillary sinus and resultant left maxillary
sinus odontogenic inflammation. Inflammatory swelling of the left
maxillary gingiva and left facial cellulitis.

## 2019-07-15 MED FILL — AMMONIUM LACTATE 12% LOTION: 12 | 14 days supply | Qty: 452 | Fill #1

## 2019-07-23 DIAGNOSIS — E559 Vitamin D deficiency, unspecified: Secondary | ICD-10-CM | POA: Diagnosis not present

## 2019-07-23 DIAGNOSIS — I480 Paroxysmal atrial fibrillation: Secondary | ICD-10-CM | POA: Diagnosis not present

## 2019-07-23 DIAGNOSIS — G8929 Other chronic pain: Secondary | ICD-10-CM | POA: Diagnosis not present

## 2019-07-23 DIAGNOSIS — Z7901 Long term (current) use of anticoagulants: Secondary | ICD-10-CM | POA: Diagnosis not present

## 2019-07-23 MED FILL — MOMETASONE FUROATE 50 MCG S: 50 | 30 days supply | Qty: 17 | Fill #0

## 2019-07-23 MED FILL — IPRATROPIUM 0.03% SPRAY: 0.03 | 21 days supply | Qty: 30 | Fill #0

## 2019-07-25 MED FILL — METOPROLOL TARTRATE 25 MG T: 25 | 30 days supply | Qty: 60 | Fill #0

## 2019-07-25 MED FILL — GABAPENTIN 600 MG TABLET: 600 | 30 days supply | Qty: 120 | Fill #0

## 2019-07-29 MED FILL — AMMONIUM LACTATE 12% LOTION: 12 | 14 days supply | Qty: 452 | Fill #2

## 2019-07-30 MED FILL — ESOMEPRAZOLE MAG DR 40 MG C: 40 | 30 days supply | Qty: 60 | Fill #3

## 2019-08-11 MED FILL — IPRATROPIUM 0.03% SPRAY: 0.03 | 21 days supply | Qty: 30 | Fill #1

## 2019-08-13 MED FILL — CIPROFLOXACIN HCL 500 MG TA: 500 | 30 days supply | Qty: 60 | Fill #2

## 2019-08-25 MED FILL — METOPROLOL TARTRATE 25 MG T: 25 | 30 days supply | Qty: 60 | Fill #1

## 2019-08-28 MED FILL — IPRATROPIUM 0.03% SPRAY: 0.03 | 21 days supply | Qty: 30 | Fill #2

## 2019-08-29 MED FILL — ESOMEPRAZOLE MAG DR 40 MG C: 40 | 30 days supply | Qty: 60 | Fill #0

## 2019-09-05 MED FILL — WARFARIN SODIUM 5 MG TABLET: 5 | 57 days supply | Qty: 114 | Fill #0

## 2019-09-08 MED FILL — GABAPENTIN 600 MG TABLET: 600 | 30 days supply | Qty: 120 | Fill #1

## 2019-09-09 MED FILL — PROGESTERONE 100 MG CAPSULE: 100 | 90 days supply | Qty: 90 | Fill #0

## 2019-09-18 MED FILL — METOPROLOL TARTRATE 25 MG T: 25 | 30 days supply | Qty: 60 | Fill #2

## 2019-09-18 MED FILL — IPRATROPIUM 0.03% SPRAY: 0.03 | 25 days supply | Qty: 30 | Fill #3

## 2019-09-22 MED FILL — POTASSIUM CHLORIDE CRYS ER: 20 | 90 days supply | Qty: 90 | Fill #1

## 2019-09-22 MED FILL — NIFEdipine ER 30 MG TB24: 30 | 90 days supply | Qty: 90 | Fill #1

## 2019-09-23 MED FILL — FUROSEMIDE 40 MG TAB: 40 | 90 days supply | Qty: 180 | Fill #0

## 2019-09-24 MED FILL — ESOMEPRAZOLE MAG DR 40 MG C: 40 | 30 days supply | Qty: 60 | Fill #1

## 2019-10-06 MED FILL — GABAPENTIN 600 MG TABLET: 600 | 30 days supply | Qty: 120 | Fill #2

## 2019-10-18 MED FILL — METOPROLOL TARTRATE 25 MG T: 25 | 30 days supply | Qty: 60 | Fill #3

## 2019-10-22 MED FILL — ESOMEPRAZOLE MAG DR 40 MG C: 40 | 30 days supply | Qty: 60 | Fill #2

## 2019-10-27 MED FILL — WARFARIN SODIUM 5 MG TABLET: 5 | 57 days supply | Qty: 114 | Fill #1

## 2019-11-21 MED FILL — ESOMEPRAZOLE MAG DR 40 MG C: 40 | 30 days supply | Qty: 60 | Fill #3

## 2019-12-02 MED FILL — PROGESTERONE MICRONIZED 100: 100 | 90 days supply | Qty: 90 | Fill #1

## 2019-12-05 MED FILL — METOPROLOL TARTRATE 25 MG T: 25 | 30 days supply | Qty: 60 | Fill #4

## 2019-12-22 MED FILL — CIPROFLOXACIN HCL 500 MG TA: 500 | 30 days supply | Qty: 60 | Fill #0

## 2019-12-22 MED FILL — NIFEdipine ER 30 MG TB24: 30 | 90 days supply | Qty: 90 | Fill #0

## 2019-12-22 MED FILL — POTASSIUM CHLORIDE CRYS ER: 20 | 90 days supply | Qty: 90 | Fill #0

## 2019-12-29 MED FILL — METOPROLOL TARTRATE 25 MG T: 25 | 30 days supply | Qty: 60 | Fill #5

## 2020-01-20 MED FILL — ESOMEPRAZOLE MAG DR 40 MG C: 40 | 30 days supply | Qty: 60 | Fill #5

## 2020-01-28 ENCOUNTER — Other Ambulatory Visit (HOSPITAL_COMMUNITY): Payer: Self-pay | Admitting: Family Medicine

## 2020-01-28 MED FILL — METOPROLOL TARTRATE 25 MG T: 25 | 90 days supply | Qty: 90 | Fill #0

## 2020-01-28 MED FILL — WARFARIN SODIUM 5 MG TABLET: 5 | 84 days supply | Qty: 36 | Fill #0

## 2020-02-19 MED FILL — ESOMEPRAZOLE MAG DR 40 MG C: 40 | 30 days supply | Qty: 60 | Fill #0

## 2020-03-03 MED FILL — PROGESTERONE 100 MG CAPS: 100 | 90 days supply | Qty: 90 | Fill #2

## 2020-03-16 MED FILL — NIFEdipine ER 30 MG TB24: 30 | 90 days supply | Qty: 90 | Fill #1

## 2020-03-16 MED FILL — POTASSIUM CHLORIDE CRYS ER: 20 | 90 days supply | Qty: 90 | Fill #1

## 2020-03-16 MED FILL — FUROSEMIDE 40 MG TAB: 40 | 90 days supply | Qty: 180 | Fill #0

## 2020-03-20 MED FILL — PROGESTERONE 100 MG CAPS: 100 | 90 days supply | Qty: 90 | Fill #2

## 2020-04-15 MED FILL — WARFARIN SODIUM 5 MG TABLET: 5 | 84 days supply | Qty: 36 | Fill #1

## 2020-04-21 MED FILL — METOPROLOL TARTRATE 25 MG T: 25 | 90 days supply | Qty: 90 | Fill #1

## 2020-06-08 MED FILL — FUROSEMIDE 40 MG TAB: 40 | 90 days supply | Qty: 180 | Fill #1

## 2020-06-12 MED FILL — PROGESTERONE 100 MG CAPS: 100 | 90 days supply | Qty: 90 | Fill #3

## 2020-06-14 ENCOUNTER — Other Ambulatory Visit (HOSPITAL_COMMUNITY): Payer: Self-pay | Admitting: Nurse Practitioner

## 2020-06-14 MED FILL — NIFEdipine ER 30 MG TB24: 30 | 90 days supply | Qty: 90 | Fill #0

## 2020-06-14 MED FILL — POTASSIUM CHLORIDE CRYS ER: 20 | 90 days supply | Qty: 90 | Fill #0

## 2020-07-20 MED FILL — METOPROLOL TARTRATE 25 MG T: 25 | 90 days supply | Qty: 90 | Fill #2

## 2020-09-06 ENCOUNTER — Other Ambulatory Visit (HOSPITAL_COMMUNITY): Payer: Self-pay | Admitting: Physician Assistant

## 2020-09-06 MED FILL — POTASSIUM CHLORIDE CRYS ER: 20 | 90 days supply | Qty: 90 | Fill #0

## 2020-09-06 MED FILL — NIFEdipine ER 30 MG TB24: 30 | 90 days supply | Qty: 90 | Fill #0

## 2020-09-06 MED FILL — FUROSEMIDE 40 MG TAB: 40 | 90 days supply | Qty: 180 | Fill #0

## 2020-09-08 ENCOUNTER — Other Ambulatory Visit (HOSPITAL_COMMUNITY): Payer: Self-pay | Admitting: Physician Assistant

## 2020-09-10 ENCOUNTER — Other Ambulatory Visit (HOSPITAL_COMMUNITY): Payer: Self-pay | Admitting: Obstetrics and Gynecology

## 2020-09-10 MED FILL — PROGESTERONE 100 MG CAPS: 100 | 90 days supply | Qty: 90 | Fill #0

## 2020-10-28 ENCOUNTER — Other Ambulatory Visit (HOSPITAL_BASED_OUTPATIENT_CLINIC_OR_DEPARTMENT_OTHER): Payer: Self-pay

## 2021-01-04 ENCOUNTER — Other Ambulatory Visit (HOSPITAL_COMMUNITY): Payer: Self-pay

## 2021-03-04 ENCOUNTER — Other Ambulatory Visit (HOSPITAL_COMMUNITY): Payer: Self-pay

## 2021-03-07 ENCOUNTER — Other Ambulatory Visit (HOSPITAL_COMMUNITY): Payer: Self-pay

## 2021-03-08 ENCOUNTER — Other Ambulatory Visit (HOSPITAL_COMMUNITY): Payer: Self-pay

## 2021-03-08 MED ORDER — POTASSIUM CHLORIDE CRYS ER 20 MEQ PO TBCR
EXTENDED_RELEASE_TABLET | ORAL | 0 refills | Status: DC
  Filled 2021-03-08: qty 90, 90d supply, fill #0

## 2021-03-09 ENCOUNTER — Other Ambulatory Visit (HOSPITAL_COMMUNITY): Payer: Self-pay

## 2021-03-29 ENCOUNTER — Other Ambulatory Visit (HOSPITAL_COMMUNITY): Payer: Self-pay

## 2021-04-19 ENCOUNTER — Other Ambulatory Visit (HOSPITAL_COMMUNITY): Payer: Self-pay

## 2021-05-27 ENCOUNTER — Other Ambulatory Visit (HOSPITAL_COMMUNITY): Payer: Self-pay

## 2021-05-27 MED ORDER — POTASSIUM CHLORIDE CRYS ER 20 MEQ PO TBCR: EXTENDED_RELEASE_TABLET | ORAL | 0 refills | Status: DC

## 2021-05-31 ENCOUNTER — Other Ambulatory Visit (HOSPITAL_COMMUNITY): Payer: Self-pay

## 2021-06-01 ENCOUNTER — Other Ambulatory Visit (HOSPITAL_COMMUNITY): Payer: Self-pay

## 2024-05-08 ENCOUNTER — Other Ambulatory Visit: Payer: Self-pay | Admitting: Family

## 2024-05-08 DIAGNOSIS — L309 Dermatitis, unspecified: Secondary | ICD-10-CM

## 2024-05-20 ENCOUNTER — Ambulatory Visit
Admission: RE | Admit: 2024-05-20 | Discharge: 2024-05-20 | Disposition: A | Discharge status: Home / Self Care | Source: Ambulatory Visit | Attending: Family | Admitting: Family

## 2024-05-20 ENCOUNTER — Ambulatory Visit

## 2024-05-20 DIAGNOSIS — L309 Dermatitis, unspecified: Secondary | ICD-10-CM
